# Patient Record
Sex: Female | Born: 1965 | Race: White | Hispanic: No | Marital: Single | Smoking: Current every day smoker
Health system: Southern US, Community
[De-identification: ages and names within clinical notes are randomized; demographics above are authoritative.]

## PROBLEM LIST (undated history)

## (undated) ENCOUNTER — Emergency Department (HOSPITAL_COMMUNITY): Admission: EM | Payer: Medicare Other | Source: Home / Self Care

## (undated) VITALS — BP 105/75 | HR 71 | Temp 97.4°F | Resp 18 | Ht 66.0 in | Wt 154.0 lb

## (undated) DIAGNOSIS — F209 Schizophrenia, unspecified: Secondary | ICD-10-CM

## (undated) DIAGNOSIS — R569 Unspecified convulsions: Secondary | ICD-10-CM

## (undated) DIAGNOSIS — F319 Bipolar disorder, unspecified: Secondary | ICD-10-CM

## (undated) DIAGNOSIS — F419 Anxiety disorder, unspecified: Secondary | ICD-10-CM

## (undated) DIAGNOSIS — M797 Fibromyalgia: Secondary | ICD-10-CM

## (undated) DIAGNOSIS — K219 Gastro-esophageal reflux disease without esophagitis: Secondary | ICD-10-CM

## (undated) DIAGNOSIS — G43909 Migraine, unspecified, not intractable, without status migrainosus: Secondary | ICD-10-CM

## (undated) DIAGNOSIS — I10 Essential (primary) hypertension: Secondary | ICD-10-CM

---

## 2002-08-12 ENCOUNTER — Emergency Department (HOSPITAL_COMMUNITY): Admission: EM | Admit: 2002-08-12 | Discharge: 2002-08-12 | Payer: Self-pay | Admitting: Emergency Medicine

## 2002-08-29 ENCOUNTER — Emergency Department (HOSPITAL_COMMUNITY): Admission: EM | Admit: 2002-08-29 | Discharge: 2002-08-29 | Payer: Self-pay | Admitting: Emergency Medicine

## 2002-09-15 ENCOUNTER — Emergency Department (HOSPITAL_COMMUNITY): Admission: EM | Admit: 2002-09-15 | Discharge: 2002-09-16 | Payer: Self-pay | Admitting: Emergency Medicine

## 2002-10-01 ENCOUNTER — Emergency Department (HOSPITAL_COMMUNITY): Admission: EM | Admit: 2002-10-01 | Discharge: 2002-10-02 | Payer: Self-pay | Admitting: *Deleted

## 2002-10-18 ENCOUNTER — Emergency Department (HOSPITAL_COMMUNITY): Admission: EM | Admit: 2002-10-18 | Discharge: 2002-10-18 | Payer: Self-pay | Admitting: Emergency Medicine

## 2002-10-28 ENCOUNTER — Emergency Department (HOSPITAL_COMMUNITY): Admission: EM | Admit: 2002-10-28 | Discharge: 2002-10-28 | Payer: Self-pay | Admitting: Emergency Medicine

## 2002-11-04 ENCOUNTER — Emergency Department (HOSPITAL_COMMUNITY): Admission: EM | Admit: 2002-11-04 | Discharge: 2002-11-04 | Payer: Self-pay | Admitting: Emergency Medicine

## 2002-11-08 ENCOUNTER — Encounter: Admission: RE | Admit: 2002-11-08 | Discharge: 2002-11-08 | Payer: Self-pay | Admitting: Internal Medicine

## 2002-11-08 ENCOUNTER — Encounter: Payer: Self-pay | Admitting: Internal Medicine

## 2002-11-09 ENCOUNTER — Emergency Department (HOSPITAL_COMMUNITY): Admission: EM | Admit: 2002-11-09 | Discharge: 2002-11-09 | Payer: Self-pay | Admitting: Emergency Medicine

## 2002-11-23 ENCOUNTER — Emergency Department (HOSPITAL_COMMUNITY): Admission: EM | Admit: 2002-11-23 | Discharge: 2002-11-24 | Payer: Self-pay | Admitting: Emergency Medicine

## 2002-11-26 ENCOUNTER — Encounter: Admission: RE | Admit: 2002-11-26 | Discharge: 2002-12-18 | Payer: Self-pay | Admitting: Orthopedic Surgery

## 2002-12-14 ENCOUNTER — Emergency Department (HOSPITAL_COMMUNITY): Admission: AD | Admit: 2002-12-14 | Discharge: 2002-12-14 | Payer: Self-pay | Admitting: Family Medicine

## 2002-12-24 ENCOUNTER — Emergency Department (HOSPITAL_COMMUNITY): Admission: AD | Admit: 2002-12-24 | Discharge: 2002-12-24 | Payer: Self-pay | Admitting: Family Medicine

## 2002-12-27 ENCOUNTER — Emergency Department (HOSPITAL_COMMUNITY): Admission: AD | Admit: 2002-12-27 | Discharge: 2002-12-27 | Payer: Self-pay | Admitting: Family Medicine

## 2003-01-01 ENCOUNTER — Encounter
Admission: RE | Admit: 2003-01-01 | Discharge: 2003-01-30 | Payer: Self-pay | Admitting: Physical Medicine and Rehabilitation

## 2003-01-07 ENCOUNTER — Encounter
Admission: RE | Admit: 2003-01-07 | Discharge: 2003-02-05 | Payer: Self-pay | Admitting: Physical Medicine and Rehabilitation

## 2003-01-09 ENCOUNTER — Emergency Department (HOSPITAL_COMMUNITY): Admission: EM | Admit: 2003-01-09 | Discharge: 2003-01-09 | Payer: Self-pay | Admitting: Emergency Medicine

## 2003-01-11 ENCOUNTER — Emergency Department (HOSPITAL_COMMUNITY): Admission: AD | Admit: 2003-01-11 | Discharge: 2003-01-11 | Payer: Self-pay | Admitting: Family Medicine

## 2003-01-14 ENCOUNTER — Emergency Department (HOSPITAL_COMMUNITY): Admission: EM | Admit: 2003-01-14 | Discharge: 2003-01-14 | Payer: Self-pay | Admitting: Emergency Medicine

## 2003-01-14 ENCOUNTER — Emergency Department (HOSPITAL_COMMUNITY): Admission: AD | Admit: 2003-01-14 | Discharge: 2003-01-14 | Payer: Self-pay | Admitting: Family Medicine

## 2003-01-29 ENCOUNTER — Emergency Department (HOSPITAL_COMMUNITY): Admission: EM | Admit: 2003-01-29 | Discharge: 2003-01-29 | Payer: Self-pay | Admitting: Emergency Medicine

## 2003-02-15 ENCOUNTER — Ambulatory Visit (HOSPITAL_COMMUNITY): Admission: RE | Admit: 2003-02-15 | Discharge: 2003-02-15 | Payer: Self-pay | Admitting: Neurology

## 2003-02-17 ENCOUNTER — Emergency Department (HOSPITAL_COMMUNITY): Admission: EM | Admit: 2003-02-17 | Discharge: 2003-02-17 | Payer: Self-pay | Admitting: Emergency Medicine

## 2003-03-01 ENCOUNTER — Encounter
Admission: RE | Admit: 2003-03-01 | Discharge: 2003-05-30 | Payer: Self-pay | Admitting: Physical Medicine and Rehabilitation

## 2003-03-01 ENCOUNTER — Emergency Department (HOSPITAL_COMMUNITY): Admission: EM | Admit: 2003-03-01 | Discharge: 2003-03-01 | Payer: Self-pay | Admitting: Emergency Medicine

## 2003-03-24 ENCOUNTER — Emergency Department (HOSPITAL_COMMUNITY): Admission: EM | Admit: 2003-03-24 | Discharge: 2003-03-24 | Payer: Self-pay | Admitting: Emergency Medicine

## 2003-04-05 ENCOUNTER — Emergency Department (HOSPITAL_COMMUNITY): Admission: EM | Admit: 2003-04-05 | Discharge: 2003-04-05 | Payer: Self-pay | Admitting: Emergency Medicine

## 2003-04-26 ENCOUNTER — Emergency Department (HOSPITAL_COMMUNITY): Admission: EM | Admit: 2003-04-26 | Discharge: 2003-04-26 | Payer: Self-pay | Admitting: Emergency Medicine

## 2003-05-04 ENCOUNTER — Emergency Department (HOSPITAL_COMMUNITY): Admission: EM | Admit: 2003-05-04 | Discharge: 2003-05-04 | Payer: Self-pay | Admitting: Emergency Medicine

## 2003-05-09 ENCOUNTER — Emergency Department (HOSPITAL_COMMUNITY): Admission: EM | Admit: 2003-05-09 | Discharge: 2003-05-09 | Payer: Self-pay | Admitting: *Deleted

## 2003-05-23 ENCOUNTER — Emergency Department (HOSPITAL_COMMUNITY): Admission: EM | Admit: 2003-05-23 | Discharge: 2003-05-23 | Payer: Self-pay | Admitting: Emergency Medicine

## 2003-05-24 ENCOUNTER — Emergency Department (HOSPITAL_COMMUNITY): Admission: AD | Admit: 2003-05-24 | Discharge: 2003-05-24 | Payer: Self-pay | Admitting: Family Medicine

## 2003-06-02 ENCOUNTER — Emergency Department (HOSPITAL_COMMUNITY): Admission: EM | Admit: 2003-06-02 | Discharge: 2003-06-02 | Payer: Self-pay | Admitting: Emergency Medicine

## 2003-06-06 ENCOUNTER — Emergency Department (HOSPITAL_COMMUNITY): Admission: EM | Admit: 2003-06-06 | Discharge: 2003-06-07 | Payer: Self-pay | Admitting: Emergency Medicine

## 2003-06-18 ENCOUNTER — Emergency Department (HOSPITAL_COMMUNITY): Admission: EM | Admit: 2003-06-18 | Discharge: 2003-06-18 | Payer: Self-pay | Admitting: Emergency Medicine

## 2003-06-19 ENCOUNTER — Emergency Department (HOSPITAL_COMMUNITY): Admission: EM | Admit: 2003-06-19 | Discharge: 2003-06-19 | Payer: Self-pay | Admitting: Family Medicine

## 2003-06-22 ENCOUNTER — Emergency Department (HOSPITAL_COMMUNITY): Admission: EM | Admit: 2003-06-22 | Discharge: 2003-06-22 | Payer: Self-pay | Admitting: Emergency Medicine

## 2003-06-26 ENCOUNTER — Emergency Department (HOSPITAL_COMMUNITY): Admission: EM | Admit: 2003-06-26 | Discharge: 2003-06-26 | Payer: Self-pay | Admitting: Emergency Medicine

## 2003-07-01 ENCOUNTER — Emergency Department (HOSPITAL_COMMUNITY): Admission: EM | Admit: 2003-07-01 | Discharge: 2003-07-01 | Payer: Self-pay | Admitting: Emergency Medicine

## 2003-07-01 ENCOUNTER — Emergency Department (HOSPITAL_COMMUNITY): Admission: EM | Admit: 2003-07-01 | Discharge: 2003-07-01 | Payer: Self-pay | Admitting: Family Medicine

## 2003-07-02 ENCOUNTER — Emergency Department (HOSPITAL_COMMUNITY): Admission: EM | Admit: 2003-07-02 | Discharge: 2003-07-02 | Payer: Self-pay | Admitting: Emergency Medicine

## 2003-07-05 ENCOUNTER — Emergency Department (HOSPITAL_COMMUNITY): Admission: EM | Admit: 2003-07-05 | Discharge: 2003-07-05 | Payer: Self-pay | Admitting: Emergency Medicine

## 2003-07-07 ENCOUNTER — Emergency Department (HOSPITAL_COMMUNITY): Admission: EM | Admit: 2003-07-07 | Discharge: 2003-07-07 | Payer: Self-pay | Admitting: Emergency Medicine

## 2003-07-16 ENCOUNTER — Emergency Department (HOSPITAL_COMMUNITY): Admission: EM | Admit: 2003-07-16 | Discharge: 2003-07-16 | Payer: Self-pay | Admitting: Emergency Medicine

## 2003-12-26 ENCOUNTER — Inpatient Hospital Stay (HOSPITAL_COMMUNITY): Admission: EM | Admit: 2003-12-26 | Discharge: 2004-01-02 | Payer: Self-pay | Admitting: Emergency Medicine

## 2003-12-26 ENCOUNTER — Ambulatory Visit: Payer: Self-pay | Admitting: Internal Medicine

## 2003-12-26 ENCOUNTER — Ambulatory Visit: Payer: Self-pay | Admitting: Cardiology

## 2003-12-27 ENCOUNTER — Encounter: Payer: Self-pay | Admitting: Cardiology

## 2003-12-27 ENCOUNTER — Encounter (INDEPENDENT_AMBULATORY_CARE_PROVIDER_SITE_OTHER): Payer: Self-pay | Admitting: Specialist

## 2004-09-02 IMAGING — CR DG PELVIS 1-2V
1 series · 1 of 1 positions shown · non-contrast
Comparison: 06/07/03.

CLINICAL DATA: 38-year-old who fell on back.  Mid thoracic pain.
 PELVIS, ONE VIEW

[view not recorded]
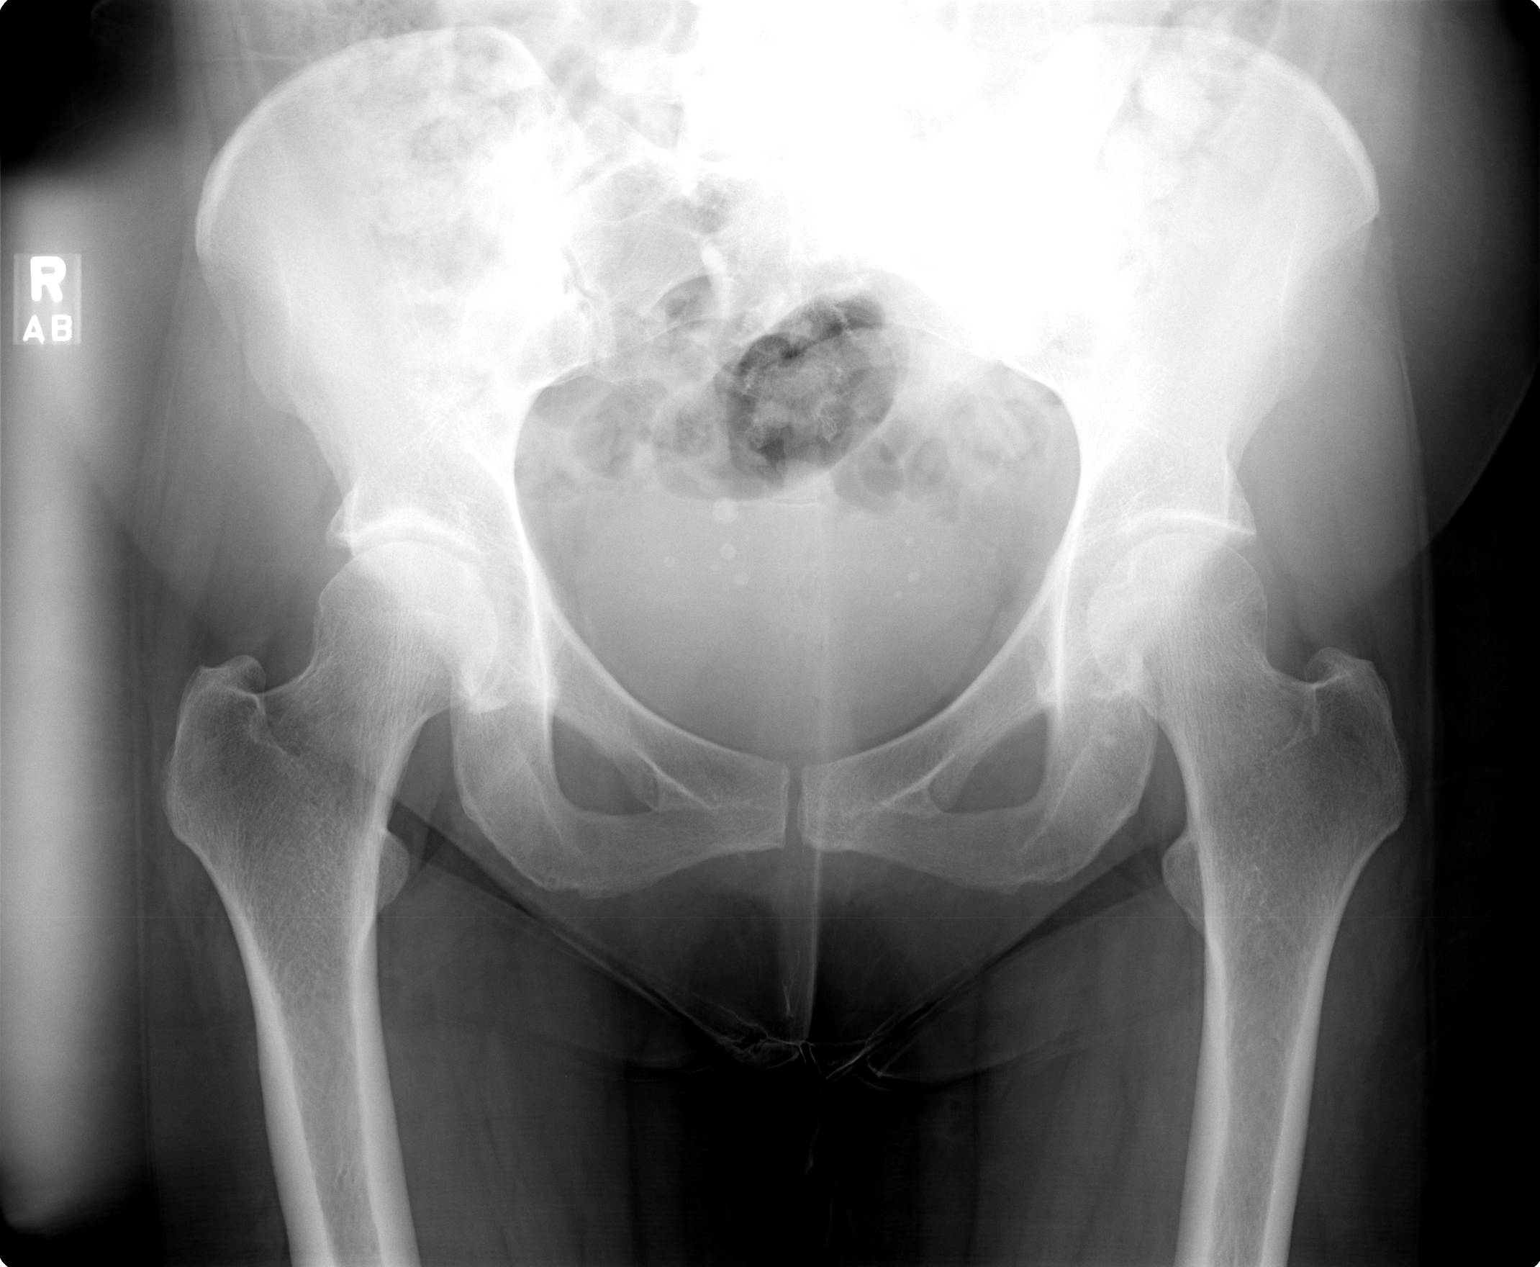

[1 of 1 positions shown; findings below may reference images not displayed]

There is no evidence of fracture or diastasis. No other significant bone or soft tissue abnormalities are identified.
 IMPRESSION
 Normal study.

## 2006-02-18 ENCOUNTER — Emergency Department (HOSPITAL_COMMUNITY): Admission: EM | Admit: 2006-02-18 | Discharge: 2006-02-18 | Payer: Self-pay | Admitting: Emergency Medicine

## 2006-04-17 ENCOUNTER — Emergency Department (HOSPITAL_COMMUNITY): Admission: EM | Admit: 2006-04-17 | Discharge: 2006-04-17 | Payer: Self-pay | Admitting: Emergency Medicine

## 2007-12-11 ENCOUNTER — Emergency Department (HOSPITAL_COMMUNITY): Admission: EM | Admit: 2007-12-11 | Discharge: 2007-12-11 | Payer: Self-pay | Admitting: Family Medicine

## 2008-04-26 ENCOUNTER — Emergency Department (HOSPITAL_COMMUNITY): Admission: EM | Admit: 2008-04-26 | Discharge: 2008-04-26 | Payer: Self-pay | Admitting: Emergency Medicine

## 2008-05-01 ENCOUNTER — Emergency Department (HOSPITAL_COMMUNITY): Admission: EM | Admit: 2008-05-01 | Discharge: 2008-05-01 | Payer: Self-pay | Admitting: Emergency Medicine

## 2008-05-31 ENCOUNTER — Emergency Department (HOSPITAL_COMMUNITY): Admission: EM | Admit: 2008-05-31 | Discharge: 2008-05-31 | Payer: Self-pay | Admitting: Emergency Medicine

## 2008-06-10 ENCOUNTER — Emergency Department (HOSPITAL_COMMUNITY): Admission: EM | Admit: 2008-06-10 | Discharge: 2008-06-10 | Payer: Self-pay | Admitting: Emergency Medicine

## 2008-07-04 ENCOUNTER — Emergency Department (HOSPITAL_COMMUNITY): Admission: EM | Admit: 2008-07-04 | Discharge: 2008-07-04 | Payer: Self-pay | Admitting: Emergency Medicine

## 2008-07-05 ENCOUNTER — Emergency Department (HOSPITAL_COMMUNITY): Admission: EM | Admit: 2008-07-05 | Discharge: 2008-07-06 | Payer: Self-pay | Admitting: Emergency Medicine

## 2008-07-06 ENCOUNTER — Emergency Department (HOSPITAL_COMMUNITY): Admission: EM | Admit: 2008-07-06 | Discharge: 2008-07-06 | Payer: Self-pay | Admitting: Emergency Medicine

## 2008-07-11 ENCOUNTER — Emergency Department (HOSPITAL_COMMUNITY): Admission: EM | Admit: 2008-07-11 | Discharge: 2008-07-11 | Payer: Self-pay | Admitting: Emergency Medicine

## 2008-07-21 ENCOUNTER — Emergency Department (HOSPITAL_COMMUNITY): Admission: EM | Admit: 2008-07-21 | Discharge: 2008-07-21 | Payer: Self-pay | Admitting: Emergency Medicine

## 2008-08-07 ENCOUNTER — Emergency Department (HOSPITAL_COMMUNITY): Admission: EM | Admit: 2008-08-07 | Discharge: 2008-08-07 | Payer: Self-pay | Admitting: Emergency Medicine

## 2008-08-13 ENCOUNTER — Emergency Department (HOSPITAL_COMMUNITY): Admission: EM | Admit: 2008-08-13 | Discharge: 2008-08-13 | Payer: Self-pay | Admitting: Emergency Medicine

## 2008-08-14 ENCOUNTER — Emergency Department (HOSPITAL_COMMUNITY): Admission: EM | Admit: 2008-08-14 | Discharge: 2008-08-14 | Payer: Self-pay | Admitting: Emergency Medicine

## 2008-08-14 ENCOUNTER — Emergency Department (HOSPITAL_COMMUNITY): Admission: EM | Admit: 2008-08-14 | Discharge: 2008-08-14 | Payer: Self-pay | Admitting: Family Medicine

## 2008-08-16 ENCOUNTER — Inpatient Hospital Stay (HOSPITAL_COMMUNITY): Admission: AD | Admit: 2008-08-16 | Discharge: 2008-08-28 | Payer: Self-pay | Admitting: Psychiatry

## 2008-08-16 ENCOUNTER — Other Ambulatory Visit: Payer: Self-pay | Admitting: Emergency Medicine

## 2008-08-16 ENCOUNTER — Ambulatory Visit: Payer: Self-pay | Admitting: Psychiatry

## 2008-09-07 ENCOUNTER — Emergency Department (HOSPITAL_COMMUNITY): Admission: EM | Admit: 2008-09-07 | Discharge: 2008-09-07 | Payer: Self-pay | Admitting: Emergency Medicine

## 2008-09-21 ENCOUNTER — Emergency Department (HOSPITAL_BASED_OUTPATIENT_CLINIC_OR_DEPARTMENT_OTHER): Admission: EM | Admit: 2008-09-21 | Discharge: 2008-09-21 | Payer: Self-pay | Admitting: Emergency Medicine

## 2008-10-07 ENCOUNTER — Emergency Department (HOSPITAL_COMMUNITY): Admission: EM | Admit: 2008-10-07 | Discharge: 2008-10-07 | Payer: Self-pay | Admitting: Emergency Medicine

## 2008-11-23 ENCOUNTER — Inpatient Hospital Stay (HOSPITAL_COMMUNITY): Admission: AD | Admit: 2008-11-23 | Discharge: 2008-11-23 | Payer: Self-pay | Admitting: Obstetrics and Gynecology

## 2008-11-23 ENCOUNTER — Ambulatory Visit: Payer: Self-pay | Admitting: Obstetrics and Gynecology

## 2009-01-21 ENCOUNTER — Emergency Department (HOSPITAL_COMMUNITY): Admission: EM | Admit: 2009-01-21 | Discharge: 2009-01-21 | Payer: Self-pay | Admitting: Emergency Medicine

## 2009-02-20 ENCOUNTER — Observation Stay (HOSPITAL_COMMUNITY): Admission: EM | Admit: 2009-02-20 | Discharge: 2009-02-20 | Payer: Self-pay | Admitting: Emergency Medicine

## 2009-02-21 ENCOUNTER — Emergency Department (HOSPITAL_COMMUNITY): Admission: EM | Admit: 2009-02-21 | Discharge: 2009-02-21 | Payer: Self-pay | Admitting: Emergency Medicine

## 2009-03-19 ENCOUNTER — Emergency Department (HOSPITAL_COMMUNITY): Admission: EM | Admit: 2009-03-19 | Discharge: 2009-03-19 | Payer: Self-pay | Admitting: Family Medicine

## 2010-03-07 ENCOUNTER — Encounter: Payer: Self-pay | Admitting: Physical Medicine and Rehabilitation

## 2010-03-07 ENCOUNTER — Encounter: Payer: Self-pay | Admitting: Neurology

## 2010-05-03 ENCOUNTER — Emergency Department (HOSPITAL_COMMUNITY)
Admission: EM | Admit: 2010-05-03 | Discharge: 2010-05-03 | Disposition: A | Discharge status: Home / Self Care | Payer: Medicare Other | Attending: Emergency Medicine | Admitting: Emergency Medicine

## 2010-05-03 DIAGNOSIS — K089 Disorder of teeth and supporting structures, unspecified: Secondary | ICD-10-CM | POA: Insufficient documentation

## 2010-05-03 DIAGNOSIS — I10 Essential (primary) hypertension: Secondary | ICD-10-CM | POA: Insufficient documentation

## 2010-05-03 DIAGNOSIS — R110 Nausea: Secondary | ICD-10-CM | POA: Insufficient documentation

## 2010-05-03 DIAGNOSIS — IMO0001 Reserved for inherently not codable concepts without codable children: Secondary | ICD-10-CM | POA: Insufficient documentation

## 2010-05-03 DIAGNOSIS — J449 Chronic obstructive pulmonary disease, unspecified: Secondary | ICD-10-CM | POA: Insufficient documentation

## 2010-05-03 DIAGNOSIS — K047 Periapical abscess without sinus: Secondary | ICD-10-CM | POA: Insufficient documentation

## 2010-05-03 DIAGNOSIS — J4489 Other specified chronic obstructive pulmonary disease: Secondary | ICD-10-CM | POA: Insufficient documentation

## 2010-05-03 DIAGNOSIS — F319 Bipolar disorder, unspecified: Secondary | ICD-10-CM | POA: Insufficient documentation

## 2010-05-03 LAB — COMPREHENSIVE METABOLIC PANEL
ALT: 18 U/L (ref 0–35)
AST: 22 U/L (ref 0–37)
Albumin: 3.7 g/dL (ref 3.5–5.2)
Alkaline Phosphatase: 73 U/L (ref 39–117)
BUN: 5 mg/dL — ABNORMAL LOW (ref 6–23)
CO2: 32 mEq/L (ref 19–32)
Calcium: 9.1 mg/dL (ref 8.4–10.5)
Chloride: 103 mEq/L (ref 96–112)
Creatinine, Ser: 0.79 mg/dL (ref 0.4–1.2)
GFR calc Af Amer: >60 mL/min (ref >60)
GFR calc non Af Amer: >60 mL/min (ref >60)
Glucose, Bld: 97 mg/dL (ref 70–99)
Potassium: 3.4 mEq/L — ABNORMAL LOW (ref 3.5–5.1)
Sodium: 142 mEq/L (ref 135–145)
Total Bilirubin: 0.3 mg/dL (ref 0.3–1.2)
Total Protein: 6.7 g/dL (ref 6.0–8.3)

## 2010-05-03 LAB — URINALYSIS, ROUTINE W REFLEX MICROSCOPIC
Bilirubin Urine: NEGATIVE
Glucose, UA: NEGATIVE mg/dL
Hgb urine dipstick: NEGATIVE
Ketones, ur: NEGATIVE mg/dL
Nitrite: NEGATIVE
Protein, ur: NEGATIVE mg/dL
Specific Gravity, Urine: 1.015 (ref 1.005–1.030)
Urobilinogen, UA: 0.2 mg/dL (ref 0.0–1.0)
pH: 8 (ref 5.0–8.0)

## 2010-05-03 LAB — PREGNANCY, URINE: Preg Test, Ur: NEGATIVE

## 2010-05-03 LAB — LIPASE, BLOOD: Lipase: 16 U/L (ref 11–59)

## 2010-05-23 LAB — CBC
HCT: 41.2 % (ref 36.0–46.0)
Hemoglobin: 14 g/dL (ref 12.0–15.0)
MCHC: 34.1 g/dL (ref 30.0–36.0)
MCV: 99.8 fL (ref 78.0–100.0)
Platelets: 212 10*3/uL (ref 150–400)
RBC: 4.12 MIL/uL (ref 3.87–5.11)
RDW: 13.6 % (ref 11.5–15.5)
WBC: 7.9 10*3/uL (ref 4.0–10.5)

## 2010-05-23 LAB — URINALYSIS, ROUTINE W REFLEX MICROSCOPIC
Bilirubin Urine: NEGATIVE
Glucose, UA: NEGATIVE mg/dL
Hgb urine dipstick: NEGATIVE
Ketones, ur: NEGATIVE mg/dL
Nitrite: NEGATIVE
Protein, ur: NEGATIVE mg/dL
Specific Gravity, Urine: 1.019 (ref 1.005–1.030)
Urobilinogen, UA: 0.2 mg/dL (ref 0.0–1.0)
pH: 5.5 (ref 5.0–8.0)

## 2010-05-23 LAB — COMPREHENSIVE METABOLIC PANEL
ALT: 99 U/L — ABNORMAL HIGH (ref 0–35)
AST: 22 U/L (ref 0–37)
Albumin: 4.3 g/dL (ref 3.5–5.2)
Alkaline Phosphatase: 125 U/L — ABNORMAL HIGH (ref 39–117)
BUN: 9 mg/dL (ref 6–23)
CO2: 29 mEq/L (ref 19–32)
Calcium: 9.1 mg/dL (ref 8.4–10.5)
Chloride: 101 mEq/L (ref 96–112)
Creatinine, Ser: 0.74 mg/dL (ref 0.4–1.2)
GFR calc Af Amer: >60 mL/min (ref >60)
GFR calc non Af Amer: >60 mL/min (ref >60)
Glucose, Bld: 143 mg/dL — ABNORMAL HIGH (ref 70–99)
Potassium: 3.2 mEq/L — ABNORMAL LOW (ref 3.5–5.1)
Sodium: 136 mEq/L (ref 135–145)
Total Bilirubin: 0.6 mg/dL (ref 0.3–1.2)
Total Protein: 7.8 g/dL (ref 6.0–8.3)

## 2010-05-23 LAB — DIFFERENTIAL
Basophils Absolute: 0 10*3/uL (ref 0.0–0.1)
Basophils Relative: 1 % (ref 0–1)
Eosinophils Absolute: 0.1 10*3/uL (ref 0.0–0.7)
Eosinophils Relative: 1 % (ref 0–5)
Lymphocytes Relative: 37 % (ref 12–46)
Lymphs Abs: 2.9 10*3/uL (ref 0.7–4.0)
Monocytes Absolute: 0.5 10*3/uL (ref 0.1–1.0)
Monocytes Relative: 6 % (ref 3–12)
Neutro Abs: 4.4 10*3/uL (ref 1.7–7.7)
Neutrophils Relative %: 56 % (ref 43–77)

## 2010-05-23 LAB — LIPASE, BLOOD: Lipase: 33 U/L (ref 11–59)

## 2010-05-23 LAB — RAPID URINE DRUG SCREEN, HOSP PERFORMED
Amphetamines: NOT DETECTED
Barbiturates: NOT DETECTED
Benzodiazepines: NOT DETECTED
Cocaine: NOT DETECTED
Opiates: NOT DETECTED
Tetrahydrocannabinol: NOT DETECTED

## 2010-05-23 LAB — ETHANOL: Alcohol, Ethyl (B): <5 mg/dL (ref 0–10)

## 2010-05-23 LAB — PREGNANCY, URINE: Preg Test, Ur: NEGATIVE

## 2010-05-23 LAB — ACETAMINOPHEN LEVEL: Acetaminophen (Tylenol), Serum: <10 ug/mL — ABNORMAL LOW (ref 10–30)

## 2010-05-24 LAB — BASIC METABOLIC PANEL
BUN: 11 mg/dL (ref 6–23)
CO2: 29 mEq/L (ref 19–32)
Calcium: 8.8 mg/dL (ref 8.4–10.5)
Chloride: 105 mEq/L (ref 96–112)
Creatinine, Ser: 0.8 mg/dL (ref 0.4–1.2)
GFR calc Af Amer: >60 mL/min (ref >60)

## 2010-05-24 LAB — POCT PREGNANCY, URINE: Preg Test, Ur: NEGATIVE

## 2010-05-24 LAB — DIFFERENTIAL
Basophils Absolute: 0 10*3/uL (ref 0.0–0.1)
Basophils Relative: 0 % (ref 0–1)
Eosinophils Absolute: 0.1 10*3/uL (ref 0.0–0.7)
Monocytes Relative: 7 % (ref 3–12)
Neutro Abs: 4.6 10*3/uL (ref 1.7–7.7)
Neutrophils Relative %: 66 % (ref 43–77)

## 2010-05-24 LAB — RAPID URINE DRUG SCREEN, HOSP PERFORMED
Amphetamines: NOT DETECTED
Barbiturates: NOT DETECTED
Opiates: POSITIVE — AB

## 2010-05-24 LAB — CBC
MCHC: 34.4 g/dL (ref 30.0–36.0)
MCV: 99.3 fL (ref 78.0–100.0)
Platelets: 208 10*3/uL (ref 150–400)
RBC: 4.3 MIL/uL (ref 3.87–5.11)
RDW: 14.5 % (ref 11.5–15.5)

## 2010-05-26 LAB — URINALYSIS, ROUTINE W REFLEX MICROSCOPIC
Bilirubin Urine: NEGATIVE
Glucose, UA: NEGATIVE mg/dL
Hgb urine dipstick: NEGATIVE
Specific Gravity, Urine: 1.007 (ref 1.005–1.030)
Urobilinogen, UA: 0.2 mg/dL (ref 0.0–1.0)
pH: 5.5 (ref 5.0–8.0)

## 2010-05-26 LAB — URINE CULTURE: Colony Count: 7000

## 2010-06-30 NOTE — Discharge Summary (Signed)
[name], [name] NO.:  [id number]   MEDICAL RECORD NO.:  [id number]          PATIENT TYPE:  IPS   LOCATION:  0305                          FACILITY:  [facility]   PHYSICIAN:  Jasmine Pang, M.D. DATE OF BIRTH:  [birth year]   DATE OF ADMISSION:  08/16/2008  DATE OF DISCHARGE:  08/28/2008                               DISCHARGE SUMMARY   IDENTIFYING INFORMATION:  This is a 45 year old divorced white female  who was admitted on August 16, 2008.   HISTORY OF PRESENT ILLNESS:  The patient presented to the ED complaining  of headache for 4 days.  She also was complaining of being homicidal  towards another resident at her assisted living.  She states she is  moving to live with her mother and on January 23, 2009, she was  hospitalized in [place].  She has been going to mental health since  5 in [place], [place] before she just recently moved here.  For further admission information, see psychiatric admission assessment.   DIAGNOSES:  Upon admission on:  Axis I:  Depression was given.  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  Axis III:  Chronic obstructive pulmonary disease, fibromyalgia,  degenerative disk disease, and hypertension.   PHYSICAL FINDINGS:  There were no acute physical or medical problems  noted.   HOSPITAL COURSE:  The patient was initially restarted on her home  medication of Paxil 60 mg daily, Zyprexa Zydis 5 mg p.o. q.4 h. for  agitation was started, and Ativan 2 mg q.4 h. p.r.n. agitation was  started.  She was also started on Fioricet for headache, Lamictal 200 mg  p.o. daily, and Percocet 5/325 mg 1-2 p.o. q.4-6 h. p.r.n. pain.  She  was started on Seroquel 100 mg p.o. b.i.d. and Seroquel 200 mg p.o.  q.h.s.  In individual sessions, the patient was initially disheveled  with poor eye contact.  There was psychomotor retardation.  Mood was  depressed and anxious.  She admitted to suicidal and homicidal  ideation  (against the another peer at her assisted living who she felt was  bothering her).  There was no evidence of psychosis.  Thoughts were  somewhat confused, but overall within normal limits.  She was having  trouble sleeping, so she was started on Ambien 5 mg p.o. q.h.s.  She  also was restarted on her home medications of Inderal 80 mg LA daily and  Flexeril 10 mg t.i.d. p.r.n. muscle spasm and she was started on  albuterol inhaler 2 puffs q.4 h. for shortness of breath.  Due to  continued problems of sleeping, the patient's Ambien was increased to 10  mg p.o. q.h.s.  By August 20, 2008, the patient was less depressed and less  anxious.  She was complaining of some blurred vision.  She discussed her  alienation from her daughter.  She wanted a family session with her.  She stated she felt her Paxil was not working.  On August 21, 2008, she was  very depressed and anxious with passive suicidal ideation.  She stated  she was  having auditory hallucinations.  She was seeing and hearing her  daughter and grandchild.  She seemed more confused I am under a lot of  stress.  She continued to be confused, though on August 22, 2008, was able  to converse better.  She did discuss the support of her mother whom she  is going home to live with and on 09/02/2008, she became more depressed  again I am just depressed with life.  She was still having suicidal  ideation.  She discussed her conflict with her sister and her daughter.  Due to her pain for degenerative disk disease, Flexeril was discontinued  and she was started on Skelaxin 800 mg p.o. t.i.d. p.o.  On August 24, 2008, she began to be titrated off Paxil.  She was started on Celexa 10  mg p.o. daily for 2 days then increased to 20 mg p.o. daily.  She stated  she felt like the Paxil has not been helping.  On August 26, 2008, she was  less confused, more logical and goal directed.  She continued to  transition from Paxil to Celexa without problems.  She  talked about  being upset because her daughter was pregnant again and already has 2  children.  She does not want to return to assisted living and will be  going home to live with her mother.  On August 27, 2008, the patient was  less depressed and less anxious.  She was having no side effects to her  medications and her sleep was good.  On August 28, 2008, mental status  improved markedly from admission status.  The patient was friendly and  cooperative with good eye contact.  Speech was normal rate and flow.  Psychomotor activity was within normal limits.  Her mood was less  depressed, less anxious.  Affect was consistent with mood.  There was no  suicidal or homicidal ideation.  No auditory or visual hallucinations.  No paranoia or delusions.  Thoughts were logical and goal directed.  The  patient wanted to go home today and was felt to be safe for discharge.  She was denying any side effects to her medication.   DISCHARGE DIAGNOSES:  Axis I:  Mood disorder, not otherwise specified.  Axis II.  Personality disorder with cluster B features.  Axis III:  Chronic obstructive pulmonary disease, fibromyalgia,  degenerative disk disease, and hypertension.  Axis IV:  Severe (problems with primary support, occupational problem,  housing problem, economic problem, burden of psychiatric illness, burden  of medical problems).  Axis V:  Global assessment of functioning was 50 at time of discharge.  Global assessment of functioning was 35 upon admission.  GAF highest  past year was 55-60.   DISCHARGE PLANS:  There was no specific activity level or dietary  restrictions.   POST HOSPITAL CARE PLANS:  The patient will go to [facility] to see  [place] on September 03, 2008 at 11 o'clock.   DISCHARGE MEDICATIONS:  1. Lamictal 200 mg daily.  2. Seroquel 200 mg at bedtime.  3. Inderal LA 80 mg daily.  4. Paxil 10 mg daily for 1 week then discontinue.  5. Celexa 20 mg daily.  6. Zyprexa Zydis  every 4 hours as needed for agitation.  7. Lorazepam 1 mg every 6 hours p.r.n. anxiety.  8. Albuterol inhaler 2 puffs every 4 hours as needed for shortness of      breath.  9. Ambien 10 mg p.o. q.h.s. p.r.n. insomnia  and Percocet as directed      by her outpatient.      Jasmine Pang, M.D.  Electronically Signed     BHS/MEDQ  D:  08/28/2008  T:  08/29/2008  Job:  [id number]

## 2010-07-03 NOTE — Op Note (Signed)
[name], STALLWORTH NO.:  [id number]   MEDICAL RECORD NO.:  [id number]          PATIENT TYPE:  INP   LOCATION:  4702                         FACILITY:  [facility]   PHYSICIAN:  Darlin Priestly, MD  DATE OF BIRTH:  [birth year]   DATE OF PROCEDURE:  12/31/2003  DATE OF DISCHARGE:                                 OPERATIVE REPORT   PROCEDURE:  Transesophageal echocardiogram.   INDICATIONS:  [name] is a 45 year old female, patient of Dr. Madaline Guthrie, with a history of dizziness and a history of CVA, questionable  history of syncope who is now referred for a transesophageal echocardiogram  to evaluate valves to rule out SBE as well as studies to rule out EFO.   DESCRIPTION OF PROCEDURE:  After given informed written consent, the patient  was brought to the endoscopy suite where she received 200 mg of IV Demerol  and 8 mg of IV Versed and remained awake throughout the entire procedure and  hemodynamically stable.   The left ventricle appeared to be normal size with normal LV systolic  function.  Estimated at approximately 60%.   Obstruction in normal aortic valve.  Obstruction in mitral valve with  trivial mitral regurgitation.  Obstruction in normal tricuspid with trace  tricuspid regurgitation.  No evidence of intracardiac mass or thrombus  noted.  No evidence of PFO by color contrast echocardiogram.  No obvious  source of embolus present.   CONCLUSIONS:  Successful transesophageal echocardiogram with no evidence of  patent foramen ovale or source of embolus.      Robe   RHM/MEDQ  D:  12/31/2003  T:  12/31/2003  Job:  [id number]   cc:   Madaline Guthrie, M.D.  [address]  [place] [address]  Fax: [phone]

## 2010-07-03 NOTE — Assessment & Plan Note (Signed)
CHIEF COMPLAINT:  Neck and back pain and recent onset of headaches.   The patient was last seen January 02, 2003.  At that time she was being  seen for chronic neck and back pain as well as fibromyalgia.  She was  apparently seen approximately five days later for headaches and had a  cranial CT without contrast on January 09, 2003, which showed small lacunar  versus demyelinating process in the left thalamus.  Similar changes were  noted in the deep white matter of the right occipital lobe.  An MRI was  recommended to follow-up on these changes.  The patient reports she has an  appointment with Dr. Mechele Collin and has an MRI scheduled on February 01, 2003,  which is in two days. She has an appointment to see Dr. Leonides Cave, her  psychologist, on Monday, which is in about six more days and she has seen  the physical therapist for evaluation one time.   Nursing reports she has been calling the clinic regularly for pain  medication.  Apparently, she may not have taken medications as prescribed at  the last visit and ran out of medication.  She reports she was unclear about  our policies regarding taking pain medication and the nurses did review once  again our clinic policies with her.  She reports today she understands the  policies now.   She continues to have neck and low back pain.  She reports she has numbness  in her left upper extremity and left lower extremity.  She denies any bowel  or bladder problems.  Denies any weakness or gait problems.  Has some  difficulty with bending and moving her neck.  She gets pain with turning her  neck.   Pain medication, she reports about 30% relief of her pain with medications.  She has gotten various other medications from the emergency room such as  Percocet and Valium recently.   CURRENT MEDICATIONS:  Inderal, clonidine, Valium #15, and Percocet #6.   ALLERGIES:  ANTI-INFLAMMATORY MEDICATIONS, SULFER MEDICATIONS and STEROIDS.   PHYSICAL  EXAMINATION:  She is oriented x3.  She is well-nourished, well-  developed woman somewhat depressed but cooperative during the examination.  She is able to come off of the exam table without difficulty.  Her gait was  essentially normal.  She had a slightly shorter stride length on the right  than on the left.  She had some difficulty heel-toe walking with balance.  Romberg test was negative but she did have a slight pronator drift on the  left.  She has limited forward flexion and extension, lateral flexion and  rotation in her cervical spine by about 30% overall.  In the lumbar spine,  she had limited forward flexion by about 30%. She had 0 lumbar extension.  She had about 5 degrees of lateral flexion both right and left.  Seated on  the exam table, reflexes were 1+ at biceps, triceps and brachial radialis  bilaterally.  She had 2+ reflexes at the knees, 0 at the ankles.  Toes are  downgoing.  No clonus was noted.  Sensory examination, she reported  diminished sensation throughout the entire left upper extremity and  throughout the entire left lower extremity.  Motor strength on the lower  extremities was in the 4+/5 range throughout with some give way weakness  noted possibly secondary to pain in her low back.  EHLs, however, were 4/5  bilaterally.  Upper extremity motor strength was in the 5/5 range.  Her  right shoulder did limit abduction.  She reported some right shoulder pain  with abduction.  Of note today, her face was symmetric and pupils were equal  and reactive.   IMPRESSION:  1. Chronic low back pain with marked L4-5 facet hypertrophy as well as left     L4-5 root encroachment noted per MRI.  2. Chronic cervical pain.  Will obtain cervical MRI to evaluate for     stenosis.  3. Recent problems with headaches and possible demyelinating process versus     lacunar infarct.  The patient is following up with Dr. Mechele Collin on     February 11, 2003.  She is going to get a brain MRI  February 01, 2003.   The patient is to finish following up with the physical therapist as well as  see Dr. Leonides Cave.  Will await results from the neurologist regarding her brain  MRI.   For pain today, she was given Lorcet 10/650 one p.o. q.i.d. #120.   Will follow the patient up in about four weeks and review the MRI scan.  Possibly have her seen by Dr. Wynn Banker for the facet hypertrophy and nerve  root encroachment on the left.      Brantley Stage, M.D.   DMK/MedQ  D:  01/30/2003 13:17:17  T:  01/30/2003 14:26:55  Job #:  [id number]   cc:   Marlowe Kays, M.D.  [address]  [place]  [place] [address]  Fax: [phone]

## 2010-07-03 NOTE — Discharge Summary (Signed)
[name], [name]              ACCOUNT NO.:  [id number]   MEDICAL RECORD NO.:  [id number]          PATIENT TYPE:  INP   LOCATION:  4702                         FACILITY:  [facility]   PHYSICIAN:  [name], M.D.    DATE OF BIRTH:  06/02/1965   DATE OF ADMISSION:  12/26/2003  DATE OF DISCHARGE:  01/02/2004                                 DISCHARGE SUMMARY   DISCHARGE DIAGNOSES:  1.  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  2.  Antiphosphoid antibody positive.  3.  Normocytic anemia.      1.  Iron studies normal.      2.  RBC folate and B12 within normal limits.  4.  Dizziness of unknown etiology.  5.  Urinary tract infection with greater than 100,000 E. coli.   PAST MEDICAL HISTORY:  1.  Asthma.  2.  Chronic neck and low back pain.  3.  Fibromyalgia.  4.  Seizures.  5.  CVA in September of 2005.  6.  Anxiety.  7.  History of oral surgery.   DISCHARGE MEDICATIONS:  1.  Aspirin 325 mg daily.  2.  Lamictal 200 mg b.i.d.  3.  Paxil 30 mg daily.  4.  Soma 350 mg t.i.d. p.r.n.  5.  Valium 2 mg t.i.d. p.r.n.  6.  Vicodin 5/500 mg q.4 h. p.r.n. pain.  7.  Zocor 20 mg daily.  8.  Ambien 10 mg nightly p.r.n.  9.  Levaquin 500 mg daily.  10. Metronidazole 500 mg q.8 h.   CONSULTANT:  [name] of Cardiology was consulted for a  transesophageal echocardiogram.   PROCEDURES:  1.  The patient had a 2-D echocardiogram performed on December 27, 2003      which was with an ejection fraction of 55% to 65%, no wall motion      abnormalities and no evidence of a patent foramen ovale.  2.  Lower extremity Dopplers were performed on December 27, 2003, which were      without any evidence of deep venous thromboses.  3.  MRI/MRA of the head were performed on December 27, 2003 which revealed a      remote thalamic lacunar cerebrovascular accident with no new lesions and      the MRA was normal.  4.  The patient had a transesophageal echocardiogram  performed on December 31, 2003 which was negative for a patent foramen ovale and negative for      any vegetations.   HISTORY OF PRESENT ILLNESS:  In brief, [name] is a 45 year old white  female with debilitating psychiatric illness and history of a CVA in  September 2005, who presented with 1 month of cough productive for yellow  and brown sputum along with 3 days of worsening shortness of breath.  The  patient also describes throbbing left-sided chest pain and abdominal pain,  as well as complaining of significant lightheadedness with multiple falls.  The patient reported chills, but no fevers, no night sweats, did report  approximately a 20-pound weight loss over 3 weeks  as well as palpitations,  nausea and vomiting.  She denied any wheezing, diarrhea.   PHYSICAL EXAM:  VITAL SIGNS:  Temperature 97.5, a pulse of 87, blood  pressure of 128/71, respiratory rate of 18, oxygen saturation 99% on room  air.  GENERAL:  In general, she was a well-appearing female in no acute distress.  HEENT:  Eyes are nonicteric, equal, reactive to light.  Extraocular  movements intact.  ENT:  Tympanic membranes were clear.  Oral mucosa was  without any lesions.  NECK:  Neck was without any lymphadenopathy, thyromegaly or carotid bruits.  LUNGS:  Respirations were clear and resonant to percussion.  CARDIOVASCULAR:  Regular rhythm and rate with no murmurs, rubs, or gallops.  Pulses were 2+ bilaterally in upper and lower extremities.  ABDOMEN:  Abdomen was with positive bowel sounds, soft, nontender.  EXTREMITIES:  Extremities were with no edema.  NEUROLOGIC:  The patient was alert and oriented x4 with a Mini-Mental Status  score of about 24.  Strength was 5/5 bilaterally in the upper extremities  and approximately 4/5 in the lower extremities.  Deep tendon reflexes were  approximately 2+ throughout.  Cerebellar function was normal.  The patient  had normal gait.   LABORATORIES:  Labs were with a  sodium of 139, potassium 3.1, chloride 104,  bicarbonate 31, BUN of 9, glucose of 89.  CBC showed a white blood cell  count of 10.7, hemoglobin 9.5, platelet count of 541,000; MCV was 93.1 with  RDW 14.5.  Cardiac enzymes were initially negative.   Chest x-ray revealed a lingular infiltrate and a cavitary lesion in the  anterior left upper lobe.   HOSPITAL COURSE:  PROBLEM #1 - COUGH/SHORTNESS OF BREATH:  Given the  cavitary lung lesion found on chest x-ray, the patient was admitted to  respiratory isolation bed and started on broad-spectrum antibiotics.  Sputum  for acid-fast bacilli was negative x2; sputum cultures were negative for  fungal infection as well; sputum culture were positive for methicillin-  resistant Staph aureus with subsequent blood cultures being negative.  The  patient was treated with IV vancomycin and then transitioned to p.o.  Levaquin.  She was maintained on metronidazole for coverage of possible  anaerobes, given the cavitary nature of her lung lesion.  HIV was checked  and was found to be nonreactive.   PROBLEM #2 - CHEST PAIN:  The patient was monitored on telemetry with  cardiac enzymes being cycled and found to be negative.  Repeat EKGs were  likewise negative.  The patient did have a 2-D echocardiogram performed  which was without any acute wall motion abnormalities and a normal ejection  fraction.   PROBLEM #3 - HISTORY OF CEREBROVASCULAR ACCIDENT:  Given the patient's young  age with a history of acute CVA, hypercoagulable workup was significant for  only antiphospholipid antibodies.  A 2-D echocardiogram was performed to  evaluate for a paradoxical embolus and a subsequent TEE was performed to  further rule this out; both of these were negative for either PFO or  vegetation.   PROBLEM #4 - DIZZINESS:  Given the patient's history of acute CVA, an MRI/MRA were performed with results as noted above.  The patient's  neurologic exam remained nonfocal.   Given her positive antiphospholipid  antibody, however, she was discharged on aspirin to prevent CVAs.   PROBLEM #5 - NORMOCYTIC ANEMIA:  Unclear what the patient's baseline  hemoglobin is.  Iron studies including ferritin were all within normal  limits, as  were RBC folate and B12.  I believe this is secondary to  menstruation.  The patient refused iron therapy.   PROBLEM #6 - URINARY TRACT INFECTION WITH GREATER THAN 100,000 COLONIES OF  E. COLI:  This was found to be sensitive to Cipro and was treated with 3  days of p.o. ciprofloxacin with resolution of symptoms.   All other problems were stable during this hospitalization.   LABORATORIES AT TIME OF DISCHARGE:  Labs at time of discharge were with a  CBC of 4.9, a hemoglobin of 9.8 and platelet count of 371,000; a sodium was  137, potassium 3.7, chloride 106, bicarbonate 27, BUN of 5, creatinine of  0.9 and glucose of 79.      KK/MEDQ  D:  03/19/2004  T:  03/19/2004  Job:  [id number]

## 2010-07-03 NOTE — Assessment & Plan Note (Signed)
[name] is a 45 year old woman who is our pain and rehabilitative  clinic for the third time for a recheck.  She has a history of L4-5 facet  hypertrophy and L4-5 root encroachment from a far lateral disk protrusion.  She has a history of chronic neck pain.  She is currently seeing a  neurologist, Dr. Porfirio Mylar Dohmeier, for headaches and for a small focal lesion  in her thalamus.  She recently had an MRI of her brain, she is following up  with Dr. Vickey Huger for that.  She comes in the clinic complaining that she  has had a terrible headache for approximately two weeks.  She requested the  lights be dimmed in the room.  She brings in some of her medications today,  we have asked that she bring in bottles of all of her medications.  She  leave her narcotic medication bottles at home but she has brought in almost  everything else.   CURRENT MEDICATIONS:  1. Lamictal 100 mg daily.  She reports that this is for a seizure disorder,     she had a seizure when she was being went off clonazepam and she is not     sure if she really needs to be on Lamictal for the seizure since she only     had the one seizure.  2. Valium q.i.d.  3. Inderal 60 mg daily.  4. Clonidine t.i.d., however reports that she has really only taken it about     eight days in the last month and one-half.  We count her pills and she     has only taken it about eight days in the last month.  5. Soma 350 mg q.i.d.  6. Zantac 150 mg p.r.n.  7. Paroxetine which she takes daily.   She reports her biggest complaint is a two-week history of a headache.  She  also has neck pain and back pain, and bilateral leg pain.  She describes her  pain in all the above areas as a 10 on a scale of zero to 10.  She gets  about 50% relief with pain medications.  On further questioning it was noted  that her blood pressure today was 170/117.  She reports that she really does  not take her blood pressure medicine as prescribed and has not done  so for  over a month.   At her last visit we had her set up to have a cervical MRI.  She has a small  to slightly moderate sized central disk herniation at C5-6 which does not  significantly narrow the neuroforamen. These results were reviewed with her.  She denies any new problems with numbness, tingling, weakness.  She denies  any new bowel or bladder symptoms.  She denies any new problems with walking  or speech.   PHYSICAL EXAMINATION:  VITAL SIGNS:  Her blood pressure is 170/117, pulse  72, respiration 16, pulse oximetry is 98/5 on room air.   DICTATION ENDED HERE.      Brantley Stage, M.D.   DMK/MedQ  D:  03/01/2003 17:21:38  T:  03/02/2003 06:40:21  Job #:  [id number]

## 2010-07-03 NOTE — Assessment & Plan Note (Signed)
PROBLEM LIST:  Problem list includes the following:  1. Chronic low back pain.  2. Left 4-5 facet hypertrophy and left L4-5 root encroachment from far-     lateral disk protrusion.  3. Chronic neck pain.  4. History of headaches.   CURRENT MEDICATIONS:  1. Lamictal 100 mg daily.  2. Valium -- prescription has run out.  3. Plavix -- the patient reports she does not take this any more.  4. Carisoprodol 350 mg 4 times daily.  5. Clonazepam t.i.d.  6. Paroxetine 30 mg nightly.  7. Inderal 60 mg daily.  8. Phenergan 25 mg q.4-6 h.  9. Clonidine 0.1 mg t.i.d.   The patient reports that she has been taking Lamictal for possible seizure  disorder.  She reports that she does not really take her clonidine at all;  we do a pill count; over the last month and a half, she has taken  approximately 8 days' worth of medication.  She does not take the Plavix and  she no longer takes Valium.  She is also taking Lorcet 10/650 mg 4 times a  day.   HISTORY OF PRESENT ILLNESS:  The patient reports that she has had a severe  headache for the last 2 weeks.  She was supposed to follow up with her  neurologist, however, she did not keep her appointment.  She had a recent  brain scan on February 15, 2003 and she has not followed up with Dr. Porfirio Mylar  Dohmeier to review this.  I also obtained an MRI on her; this was reviewed  with her.  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  The patient did  not bring in her narcotic bottle today.  Nurse called the pharmacy.  Apparently, Ms. Chambers has been using several pharmacies to fill her  medications.  She signed a contract with [place] to be the only narcotic [employer]  for her; she has broken this contract and has had Dr. Vickey Huger also  prescribe oxycodone for her as well.   The patient denies any new problems with numbness, tingling, weakness; no  new bowel or bladder symptoms.  Her  pain in her head and neck and low back  are all a 10 on a scale of 10, per her report.   PHYSICAL EXAMINATION:  GENERAL:  On exam today, she appears somewhat tired.  She is alert and cooperative.  NEUROMUSCULAR:  She is able to get out of the exam chair without difficulty.  Her gait in the room is normal.  She is able to walk on heels and toes.  Her  tandem gait is slow but she is able to perform this adequately.  Romberg's  test is negative.  Seated, her reflexes in the upper and lower extremities  are 1+ at the biceps, triceps and brachioradialis bilaterally.  At the  knees, she is 2+ in the right patellar tendon, 1+ at the left patellar  tendon, 0 at the ankles and toes are downgoing.  She reports diminished  sensation throughout the entire left side of her body, upper and lower  extremities, to light touch and pinprick.   Her face moved symmetrically today and her speech is clear.   ASSESSMENT:  1. Chronic low back and neck pain.  2. 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  3. Headache.  Blood pressure is 170/117; the rest of her vital signs are     stable, pulse 72, respirations  16, 98% on room air.  We discussed that     her blood pressure is extremely high.  We rechecked the blood pressure     today; on repeat, it is 167/111.  She is advised to see her family doctor     after she leaves our clinic; we help her make an appointment to see Dr.     Yetta Barre.  Of note, she does not take her clonidine as directed; in fact,     she has only taken about 8 days' worth over the last month and a half and     this may have some bearing on her headache.  4. We also encourage her to return to  Dr. Vickey Huger for followup of her     brain scan and further treatment from her neurologist.   Mr. Jamroz has demonstrated that she uses multiple pharmacies and has used  multiple physicians to obtain opioid medications.  She is not compliant with  her medications and I have concerns about treating her with  narcotics, given  her noncompliance and using multiple physicians as well as pharmacies.  At  this point, we would like to discharge her from our clinic and she is also  given a list of other pain management centers from which she can choose.      Brantley Stage, M.D.   DMK/MedQ  D:  03/01/2003 17:30:58  T:  03/02/2003 06:23:16  Job #:  [id number]

## 2010-08-05 ENCOUNTER — Emergency Department (HOSPITAL_COMMUNITY)
Admission: EM | Admit: 2010-08-05 | Discharge: 2010-08-05 | Disposition: A | Discharge status: Home / Self Care | Payer: Medicare Other | Attending: Emergency Medicine | Admitting: Emergency Medicine

## 2010-08-05 DIAGNOSIS — M545 Low back pain, unspecified: Secondary | ICD-10-CM | POA: Insufficient documentation

## 2010-08-05 DIAGNOSIS — IMO0002 Reserved for concepts with insufficient information to code with codable children: Secondary | ICD-10-CM | POA: Insufficient documentation

## 2010-08-05 DIAGNOSIS — M542 Cervicalgia: Secondary | ICD-10-CM | POA: Insufficient documentation

## 2010-08-05 DIAGNOSIS — Z79899 Other long term (current) drug therapy: Secondary | ICD-10-CM | POA: Insufficient documentation

## 2010-08-05 DIAGNOSIS — J4489 Other specified chronic obstructive pulmonary disease: Secondary | ICD-10-CM | POA: Insufficient documentation

## 2010-08-05 DIAGNOSIS — M79609 Pain in unspecified limb: Secondary | ICD-10-CM | POA: Insufficient documentation

## 2010-08-05 DIAGNOSIS — J449 Chronic obstructive pulmonary disease, unspecified: Secondary | ICD-10-CM | POA: Insufficient documentation

## 2010-08-05 DIAGNOSIS — F319 Bipolar disorder, unspecified: Secondary | ICD-10-CM | POA: Insufficient documentation

## 2010-08-05 DIAGNOSIS — I10 Essential (primary) hypertension: Secondary | ICD-10-CM | POA: Insufficient documentation

## 2010-08-05 DIAGNOSIS — G8929 Other chronic pain: Secondary | ICD-10-CM | POA: Insufficient documentation

## 2010-08-30 ENCOUNTER — Emergency Department (HOSPITAL_COMMUNITY)
Admission: EM | Admit: 2010-08-30 | Discharge: 2010-08-30 | Disposition: A | Discharge status: Home / Self Care | Payer: Medicare Other | Attending: Emergency Medicine | Admitting: Emergency Medicine

## 2010-08-30 DIAGNOSIS — J449 Chronic obstructive pulmonary disease, unspecified: Secondary | ICD-10-CM | POA: Insufficient documentation

## 2010-08-30 DIAGNOSIS — M545 Low back pain, unspecified: Secondary | ICD-10-CM | POA: Insufficient documentation

## 2010-08-30 DIAGNOSIS — R209 Unspecified disturbances of skin sensation: Secondary | ICD-10-CM | POA: Insufficient documentation

## 2010-08-30 DIAGNOSIS — IMO0001 Reserved for inherently not codable concepts without codable children: Secondary | ICD-10-CM | POA: Insufficient documentation

## 2010-08-30 DIAGNOSIS — J4489 Other specified chronic obstructive pulmonary disease: Secondary | ICD-10-CM | POA: Insufficient documentation

## 2010-08-30 DIAGNOSIS — S335XXA Sprain of ligaments of lumbar spine, initial encounter: Secondary | ICD-10-CM | POA: Insufficient documentation

## 2010-08-30 DIAGNOSIS — F319 Bipolar disorder, unspecified: Secondary | ICD-10-CM | POA: Insufficient documentation

## 2010-08-30 DIAGNOSIS — I10 Essential (primary) hypertension: Secondary | ICD-10-CM | POA: Insufficient documentation

## 2010-08-30 DIAGNOSIS — M5430 Sciatica, unspecified side: Secondary | ICD-10-CM | POA: Insufficient documentation

## 2010-09-23 ENCOUNTER — Ambulatory Visit
Admission: RE | Admit: 2010-09-23 | Discharge: 2010-09-23 | Disposition: A | Discharge status: Home / Self Care | Payer: Medicare Other | Source: Ambulatory Visit | Attending: Specialist | Admitting: Specialist

## 2010-09-23 ENCOUNTER — Other Ambulatory Visit: Payer: Self-pay | Admitting: Specialist

## 2010-09-23 DIAGNOSIS — M549 Dorsalgia, unspecified: Secondary | ICD-10-CM

## 2010-10-06 ENCOUNTER — Other Ambulatory Visit: Payer: Self-pay | Admitting: Specialist

## 2010-10-06 DIAGNOSIS — M545 Low back pain: Secondary | ICD-10-CM

## 2010-10-06 DIAGNOSIS — IMO0002 Reserved for concepts with insufficient information to code with codable children: Secondary | ICD-10-CM

## 2010-10-07 ENCOUNTER — Other Ambulatory Visit: Payer: Medicare Other

## 2010-10-17 ENCOUNTER — Inpatient Hospital Stay: Admission: RE | Admit: 2010-10-17 | Payer: Medicare Other | Source: Ambulatory Visit

## 2010-10-24 ENCOUNTER — Inpatient Hospital Stay: Admission: RE | Admit: 2010-10-24 | Payer: Medicare Other | Source: Ambulatory Visit

## 2010-10-29 ENCOUNTER — Emergency Department (HOSPITAL_COMMUNITY)
Admission: EM | Admit: 2010-10-29 | Discharge: 2010-10-29 | Disposition: A | Discharge status: Home / Self Care | Payer: Medicare Other | Attending: Emergency Medicine | Admitting: Emergency Medicine

## 2010-10-29 DIAGNOSIS — M79609 Pain in unspecified limb: Secondary | ICD-10-CM | POA: Insufficient documentation

## 2010-10-29 DIAGNOSIS — IMO0002 Reserved for concepts with insufficient information to code with codable children: Secondary | ICD-10-CM | POA: Insufficient documentation

## 2010-10-29 DIAGNOSIS — M545 Low back pain, unspecified: Secondary | ICD-10-CM | POA: Insufficient documentation

## 2010-10-29 DIAGNOSIS — N393 Stress incontinence (female) (male): Secondary | ICD-10-CM | POA: Insufficient documentation

## 2010-10-29 DIAGNOSIS — F319 Bipolar disorder, unspecified: Secondary | ICD-10-CM | POA: Insufficient documentation

## 2010-10-29 DIAGNOSIS — I10 Essential (primary) hypertension: Secondary | ICD-10-CM | POA: Insufficient documentation

## 2010-10-29 DIAGNOSIS — J4489 Other specified chronic obstructive pulmonary disease: Secondary | ICD-10-CM | POA: Insufficient documentation

## 2010-10-29 DIAGNOSIS — J449 Chronic obstructive pulmonary disease, unspecified: Secondary | ICD-10-CM | POA: Insufficient documentation

## 2011-06-02 ENCOUNTER — Encounter (HOSPITAL_COMMUNITY): Payer: Self-pay

## 2011-06-02 ENCOUNTER — Emergency Department (HOSPITAL_COMMUNITY)
Admission: EM | Admit: 2011-06-02 | Discharge: 2011-06-07 | Disposition: A | Discharge status: Home / Self Care | Payer: Medicare Other | Attending: Emergency Medicine | Admitting: Emergency Medicine

## 2011-06-02 DIAGNOSIS — K219 Gastro-esophageal reflux disease without esophagitis: Secondary | ICD-10-CM | POA: Insufficient documentation

## 2011-06-02 DIAGNOSIS — R4689 Other symptoms and signs involving appearance and behavior: Secondary | ICD-10-CM

## 2011-06-02 DIAGNOSIS — R45850 Homicidal ideations: Secondary | ICD-10-CM | POA: Insufficient documentation

## 2011-06-02 DIAGNOSIS — F319 Bipolar disorder, unspecified: Secondary | ICD-10-CM | POA: Insufficient documentation

## 2011-06-02 DIAGNOSIS — Z8659 Personal history of other mental and behavioral disorders: Secondary | ICD-10-CM | POA: Insufficient documentation

## 2011-06-02 DIAGNOSIS — Z79899 Other long term (current) drug therapy: Secondary | ICD-10-CM | POA: Insufficient documentation

## 2011-06-02 DIAGNOSIS — F489 Nonpsychotic mental disorder, unspecified: Secondary | ICD-10-CM | POA: Insufficient documentation

## 2011-06-02 DIAGNOSIS — N390 Urinary tract infection, site not specified: Secondary | ICD-10-CM

## 2011-06-02 DIAGNOSIS — F603 Borderline personality disorder: Secondary | ICD-10-CM | POA: Insufficient documentation

## 2011-06-02 DIAGNOSIS — I10 Essential (primary) hypertension: Secondary | ICD-10-CM | POA: Insufficient documentation

## 2011-06-02 HISTORY — DX: Essential (primary) hypertension: I10

## 2011-06-02 HISTORY — DX: Schizophrenia, unspecified: F20.9

## 2011-06-02 HISTORY — DX: Fibromyalgia: M79.7

## 2011-06-02 HISTORY — DX: Anxiety disorder, unspecified: F41.9

## 2011-06-02 HISTORY — DX: Bipolar disorder, unspecified: F31.9

## 2011-06-02 HISTORY — DX: Gastro-esophageal reflux disease without esophagitis: K21.9

## 2011-06-02 MED ORDER — QUETIAPINE FUMARATE 100 MG PO TABS
400.0000 mg | ORAL_TABLET | Freq: Every day | ORAL | Status: DC
  Administered 2011-06-02 – 2011-06-06 (×5): 400 mg via ORAL
  Filled 2011-06-02 (×5): qty 4

## 2011-06-02 MED ORDER — HYDROCODONE-ACETAMINOPHEN 10-325 MG PO TABS
1.0000 | ORAL_TABLET | Freq: Once | ORAL | Status: DC
  Filled 2011-06-02: qty 1

## 2011-06-02 MED ORDER — HYDROCHLOROTHIAZIDE 25 MG PO TABS
25.0000 mg | ORAL_TABLET | Freq: Every day | ORAL | Status: DC
  Administered 2011-06-02 – 2011-06-07 (×6): 25 mg via ORAL
  Filled 2011-06-02 (×8): qty 1

## 2011-06-02 MED ORDER — ACETAMINOPHEN 325 MG PO TABS
650.0000 mg | ORAL_TABLET | ORAL | Status: DC | PRN
  Administered 2011-06-04 – 2011-06-07 (×4): 650 mg via ORAL
  Filled 2011-06-02 (×4): qty 2

## 2011-06-02 MED ORDER — HYDROCODONE-ACETAMINOPHEN 5-325 MG PO TABS
2.0000 | ORAL_TABLET | Freq: Once | ORAL | Status: AC
  Administered 2011-06-02: 2 via ORAL
  Filled 2011-06-02: qty 2

## 2011-06-02 MED ORDER — ALUM & MAG HYDROXIDE-SIMETH 200-200-20 MG/5ML PO SUSP
30.0000 mL | ORAL | Status: DC | PRN
  Administered 2011-06-06: 30 mL via ORAL
  Filled 2011-06-02: qty 30

## 2011-06-02 MED ORDER — NICOTINE 21 MG/24HR TD PT24
21.0000 mg | MEDICATED_PATCH | Freq: Every day | TRANSDERMAL | Status: DC
  Administered 2011-06-03 – 2011-06-07 (×5): 21 mg via TRANSDERMAL
  Filled 2011-06-02 (×5): qty 1

## 2011-06-02 MED ORDER — CITALOPRAM HYDROBROMIDE 40 MG PO TABS
40.0000 mg | ORAL_TABLET | Freq: Every day | ORAL | Status: DC
  Administered 2011-06-02 – 2011-06-03 (×2): 40 mg via ORAL
  Filled 2011-06-02 (×4): qty 1

## 2011-06-02 MED ORDER — IBUPROFEN 600 MG PO TABS
600.0000 mg | ORAL_TABLET | Freq: Three times a day (TID) | ORAL | Status: DC | PRN
  Administered 2011-06-02 – 2011-06-07 (×6): 600 mg via ORAL
  Filled 2011-06-02 (×3): qty 1
  Filled 2011-06-02: qty 3
  Filled 2011-06-02 (×2): qty 1

## 2011-06-02 MED ORDER — ONDANSETRON HCL 4 MG PO TABS: 4.0000 mg | ORAL_TABLET | Freq: Three times a day (TID) | ORAL | Status: DC | PRN

## 2011-06-02 MED ORDER — ZOLPIDEM TARTRATE 5 MG PO TABS
5.0000 mg | ORAL_TABLET | Freq: Every evening | ORAL | Status: DC | PRN
  Administered 2011-06-02 – 2011-06-06 (×5): 5 mg via ORAL
  Filled 2011-06-02 (×5): qty 1

## 2011-06-02 MED ORDER — LAMOTRIGINE 200 MG PO TABS
200.0000 mg | ORAL_TABLET | Freq: Every day | ORAL | Status: DC
  Administered 2011-06-02 – 2011-06-04 (×3): 200 mg via ORAL
  Filled 2011-06-02 (×4): qty 1

## 2011-06-02 NOTE — ED Notes (Signed)
Loud noises heard from pt's room and pt seen throwing her remote control on the floor. Remote control is broken and pt is verbally aggressive towards staff. Table, chair and trash can removed from room, remote control discarded. Pt states she is sick and tired of being without her medicine and feels like killing someone. States she is waiting to be placed somewhere that can get her back on her medicine, and she wants an "A" team to come check on her at her house twice a week. During the same conversation, pt states she had all of her medicines this morning, but has been out of the Citalopram for about a week and a half and that it makes her feel horrible. Also reports that she takes Lorcet 10/325 PO BID for back pain. States she has had lower back pain for 20 years and had a laminectomy in Feb 2013. Also requesting Klonopin 1mg  PO BID for anxiety. EDP (PA) aware of med requests.

## 2011-06-02 NOTE — ED Notes (Signed)
Patient reports that she is out of her medications. Patient verbally expresses that she is angry and wants to push anybody down the stairs. Patient also states that she asways has a suicide plan and has had one for 20 years

## 2011-06-02 NOTE — ED Provider Notes (Signed)
History     CSN: [id number]  Arrival date & time 06/02/11  1601   First MD Initiated Contact with Patient 06/02/11 1653      Chief Complaint  Patient presents with  . Medical Clearance    (Consider location/radiation/quality/duration/timing/severity/associated sxs/prior treatment) HPI  Pt presents to the ED with complaints of suicidal ideation and homicidal ideation. She admits to being off of her medications for "a while". The patient is VERY agitated and VERY aggressive. [employer] is outside the door right now. She states that she plans to kill everyone in her family and if they were all dead right now she would go to their grave and spit on it. She proceeds to tell me that I am patronizing her by being in the room and that she is going to add me to the list of people she wants to kill. She states that everything she does is premeditated, she thinks really long and hard before she does it. She is currently here voluntarily and is not IVC'd.  Past Medical History  Diagnosis Date  . Bipolar 1 disorder   . Schizophrenia   . Anxiety   . Fibromyalgia   . GERD (gastroesophageal reflux disease)   . Hypertension     Past Surgical History  Procedure Date  . Back surgery     No family history on file.  History  Substance Use Topics  . Smoking status: Current Everyday Smoker  . Smokeless tobacco: Not on file  . Alcohol Use: No    OB History    Grav Para Term Preterm Abortions TAB SAB Ect Mult Living                  Review of Systems  Unable to perform ROS   Allergies  Prednisone  Home Medications   Current Outpatient Rx  Name Route Sig Dispense Refill  . CITALOPRAM HYDROBROMIDE 40 MG PO TABS Oral Take 40 mg by mouth daily.    Marland Kitchen CLONAZEPAM 1 MG PO TABS Oral Take 1 mg by mouth 2 (two) times daily as needed. For anxiety.    Marland Kitchen HYDROCHLOROTHIAZIDE 25 MG PO TABS Oral Take 25 mg by mouth daily.    Marland Kitchen HYDROCODONE-ACETAMINOPHEN 10-650 MG PO TABS Oral Take 1 tablet by mouth  every 6 (six) hours as needed. For pain.    Marland Kitchen LAMOTRIGINE 200 MG PO TABS Oral Take 200 mg by mouth daily.    . QUETIAPINE FUMARATE 200 MG PO TABS Oral Take 400 mg by mouth at bedtime.    Marland Kitchen RANITIDINE HCL 150 MG PO TABS Oral Take 150 mg by mouth 2 (two) times daily.      BP 135/83  Pulse 82  Temp(Src) 98 F (36.7 C) (Oral)  Resp 18  SpO2 98%  Physical Exam  Nursing note and vitals reviewed. Constitutional: She appears well-developed and well-nourished. No distress.  HENT:  Head: Normocephalic and atraumatic.  Eyes: Pupils are equal, round, and reactive to light.  Neck: Normal range of motion. Neck supple.  Cardiovascular: Normal rate and regular rhythm.   Pulmonary/Chest: Effort normal.  Abdominal: Soft.  Neurological: She is alert.  Skin: Skin is warm and dry.  Psychiatric: Her affect is angry. She is agitated, aggressive and combative. She expresses impulsivity and inappropriate judgment. She expresses homicidal and suicidal ideation. She expresses suicidal plans and homicidal plans.    ED Course  Procedures (including critical care time)  Labs Reviewed - No data to display No results found.  1. Homicidal ideations   2. Suicidal behavior   3. Aggressive behavior       MDM  ACT to consult         Dorthula Matas, PA 06/02/11 1747

## 2011-06-02 NOTE — BH Assessment (Signed)
Assessment Note   [name] is a 46 y.o. female who reports voluntarily after reportedly threatening to kill her daughter this morning during breakfast. Pt reports she has a history of bipolar disorder since she was 46 years old. She states she has been off her medications for 2 weeks due to her believing that the medications are making her go blind. Pt states she is taking Lamictal, Seroquel, and Klonopin. Pt reports she use to take Paxil and reports she feels that it helped alleviate mood swings. Pt states that she feels angry and is having "seriously violent thoughts." Pt reports she has been having rapid mood swings where she will start "crying over nothing" and then "want to cause someone pain." pt states she has been unable to sleep for several days and feels restless. She states she feels like her "brain is jittery" and is hearing a ringing in her ears. Pt reports numerous hospitlizations including [facility], pt could not recall dates of inpatient stays. Pt also reports she recently was released from jail but would not give details as to why she was incarcerated or if she has an upcoming court date. Pt has no current out patient psychiatrist or therapist.   Pt also endorses SI, stating she thought about hurting herself earlier today to keep her from hurting others. Pt denies [facility], however, pt states that people have been talking to her through the TV. Pt endorses beliefs of paranoia, stating she feels like she is being watched. Pt appears restless, verbally aggressive, and speaks with pressured speech. Pt states that feels anxious being "cooped up." Pt also requested a sheet from RN to cover the TV. Pt is seeking inpatient treatment as she feels she is not safe to others or herself at this time. Pt also states she wants treatment so that her "grandchildren don't see me this way." She reports her young grandchildren live in the home with her and her daughter.           Axis I: Bipolar 1 Disorder,  Severe with Psychotic Features  Axis II: Deferred Axis III:  Past Medical History  Diagnosis Date  . Bipolar 1 disorder   . Schizophrenia   . Anxiety   . Fibromyalgia   . GERD (gastroesophageal reflux disease)   . Hypertension    Axis IV: other psychosocial or environmental problems Axis V: 21-30 behavior considerably influenced by delusions or hallucinations OR serious impairment in judgment, communication OR inability to function in almost all areas  Past Medical History:  Past Medical History  Diagnosis Date  . Bipolar 1 disorder   . Schizophrenia   . Anxiety   . Fibromyalgia   . GERD (gastroesophageal reflux disease)   . Hypertension     Past Surgical History  Procedure Date  . Back surgery     Family History: No family history on file.  Social History:  reports that she has been smoking.  She does not have any smokeless tobacco history on file. She reports that she does not drink alcohol or use illicit drugs.  Additional Social History:  Alcohol / Drug Use History of alcohol / drug use?: No history of alcohol / drug abuse Allergies:  Allergies  Allergen Reactions  . Prednisone Shortness Of Breath and Swelling    Home Medications:  Medications Prior to Admission  Medication Dose Route Frequency Provider Last Rate Last Dose  . acetaminophen (TYLENOL) tablet 650 mg  650 mg Oral Q4H PRN Dorthula Matas, PA      .  alum & mag hydroxide-simeth (MAALOX/MYLANTA) 200-200-20 MG/5ML suspension 30 mL  30 mL Oral PRN Dorthula Matas, PA      . citalopram (CELEXA) tablet 40 mg  40 mg Oral Daily Dorthula Matas, PA   40 mg at 06/02/11 2106  . hydrochlorothiazide (HYDRODIURIL) tablet 25 mg  25 mg Oral Daily Dorthula Matas, PA   25 mg at 06/02/11 2106  . HYDROcodone-acetaminophen (NORCO) 5-325 MG per tablet 2 tablet  2 tablet Oral Once Nelia Shi, MD   2 tablet at 06/02/11 2032  . ibuprofen (ADVIL,MOTRIN) tablet 600 mg  600 mg Oral Q8H PRN Dorthula Matas, PA   600 mg  at 06/02/11 1831  . lamoTRIgine (LAMICTAL) tablet 200 mg  200 mg Oral Daily Dorthula Matas, PA   200 mg at 06/02/11 2106  . nicotine (NICODERM CQ - dosed in mg/24 hours) patch 21 mg  21 mg Transdermal Daily Dorthula Matas, PA      . ondansetron (ZOFRAN) tablet 4 mg  4 mg Oral Q8H PRN Dorthula Matas, PA      . QUEtiapine (SEROQUEL) tablet 400 mg  400 mg Oral QHS Dorthula Matas, PA   400 mg at 06/02/11 2108  . zolpidem (AMBIEN) tablet 5 mg  5 mg Oral QHS PRN Dorthula Matas, PA   5 mg at 06/02/11 2108  . DISCONTD: HYDROcodone-acetaminophen (NORCO) 10-325 MG per tablet 1 tablet  1 tablet Oral Once Dorthula Matas, PA       Medications Prior to Admission  Medication Sig Dispense Refill  . citalopram (CELEXA) 40 MG tablet Take 40 mg by mouth daily.      . hydrochlorothiazide (HYDRODIURIL) 25 MG tablet Take 25 mg by mouth daily.      Marland Kitchen lamoTRIgine (LAMICTAL) 200 MG tablet Take 200 mg by mouth daily.      . ranitidine (ZANTAC) 150 MG tablet Take 150 mg by mouth 2 (two) times daily.        OB/GYN Status:  No LMP recorded. Patient is postmenopausal.  General Assessment Data Location of Assessment: [facility] Living Arrangements: Other relatives Can pt return to current living arrangement?: Yes Admission Status: Voluntary Is patient capable of signing voluntary admission?: Yes Transfer from: Acute Hospital Referral Source: Self/Family/Friend  Education Status Is patient currently in school?: No  Risk to self Suicidal Ideation: Yes-Currently Present Suicidal Intent: No Is patient at risk for suicide?: Yes Suicidal Plan?: No Access to Means: No What has been your use of drugs/alcohol within the last 12 months?: denies Previous Attempts/Gestures: Yes How many times?:  (reports several) Triggers for Past Attempts: Unpredictable Intentional Self Injurious Behavior: None Family Suicide History: No Recent stressful life event(s): Conflict (Comment) (with family members) Persecutory  voices/beliefs?: Yes Depression: Yes Depression Symptoms: Tearfulness;Insomnia;Loss of interest in usual pleasures;Feeling angry/irritable Substance abuse history and/or treatment for substance abuse?: No Suicide prevention information given to non-admitted patients: Not applicable  Risk to Others Homicidal Ideation: Yes-Currently Present Thoughts of Harm to Others: Yes-Currently Present Comment - Thoughts of Harm to Others: reports wants to hurt family members Current Homicidal Intent: Yes-Currently Present Current Homicidal Plan: No Access to Homicidal Means: Yes Describe Access to Homicidal Means: kinves at home Identified Victim: family members History of harm to others?: Yes Assessment of Violence: None Noted Violent Behavior Description: throwing objects Does patient have access to weapons?: No Criminal Charges Pending?: Yes Describe Pending Criminal Charges:  (reports she just got relesed from jail, wou'd  not give detai) Does patient have a court date:  (unknown)  Psychosis Hallucinations: Auditory Delusions: None noted  Mental Status Report Appear/Hygiene: Disheveled Eye Contact: Good Motor Activity: Restlessness Speech: Aggressive;Logical/coherent Level of Consciousness: Alert Mood: Angry Affect: Angry Anxiety Level: None Thought Processes: Coherent;Relevant Judgement: Impaired Orientation: Person;Place;Time;Situation Obsessive Compulsive Thoughts/Behaviors: None  Cognitive Functioning Concentration: Normal Memory: Recent Intact IQ: Average Insight: Fair Impulse Control: Poor Appetite: Fair Weight Loss: 0  Weight Gain: 0  Sleep: Decreased Total Hours of Sleep:  (reports disrupted sleep) Vegetative Symptoms: None  Prior Inpatient Therapy Prior Inpatient Therapy: Yes Prior Therapy Dates:  (reports she can not remember) Prior Therapy Facilty/Provider(s): repoprts several stays including [facility] Reason for Treatment: bipolar disorder  Prior Outpatient  Therapy Prior Outpatient Therapy: Yes Prior Therapy Dates: states years ago Reason for Treatment: bipolar disorder  ADL Screening (condition at time of admission) Patient's cognitive ability adequate to safely complete daily activities?: Yes Patient able to express need for assistance with ADLs?: Yes Independently performs ADLs?: Yes Weakness of Legs: None Weakness of Arms/Hands: None  Home Assistive Devices/Equipment Home Assistive Devices/Equipment: None    Abuse/Neglect Assessment (Assessment to be complete while patient is alone) Physical Abuse: Denies Verbal Abuse: Denies Sexual Abuse: Denies Exploitation of patient/patient's resources: Denies Self-Neglect: Denies Values / Beliefs Cultural Requests During Hospitalization: None Spiritual Requests During Hospitalization: None   Advance Directives (For Healthcare) Advance Directive: Patient does not have advance directive Nutrition Screen Diet: Regular Unintentional weight loss greater than 10lbs within the last month: No Problems chewing or swallowing foods and/or liquids: No Home Tube Feeding or Total Parenteral Nutrition (TPN): No Patient appears severely malnourished: No Pregnant or Lactating: No  Additional Information 1:1 In Past 12 Months?: No CIRT Risk: Yes Elopement Risk: No Does patient have medical clearance?: Yes     Disposition:  Disposition Disposition of Patient: Referred to;Inpatient treatment program  On Site Evaluation by:   Reviewed with Physician:     Marjean Donna 06/02/2011 9:39 PM

## 2011-06-03 DIAGNOSIS — F302 Manic episode, severe with psychotic symptoms: Secondary | ICD-10-CM

## 2011-06-03 MED ORDER — HYDROCODONE-ACETAMINOPHEN 10-325 MG PO TABS
1.0000 | ORAL_TABLET | Freq: Two times a day (BID) | ORAL | Status: DC | PRN
  Administered 2011-06-03 – 2011-06-06 (×7): 1 via ORAL
  Filled 2011-06-03 (×7): qty 1

## 2011-06-03 MED ORDER — PAROXETINE HCL ER 25 MG PO TB24
25.0000 mg | ORAL_TABLET | Freq: Every day | ORAL | Status: DC
  Administered 2011-06-04 – 2011-06-07 (×4): 25 mg via ORAL
  Filled 2011-06-03 (×5): qty 1

## 2011-06-03 MED ORDER — ALBUTEROL SULFATE HFA 108 (90 BASE) MCG/ACT IN AERS
2.0000 | INHALATION_SPRAY | RESPIRATORY_TRACT | Status: DC | PRN
  Administered 2011-06-03 – 2011-06-05 (×3): 2 via RESPIRATORY_TRACT
  Filled 2011-06-03: qty 6.7

## 2011-06-03 MED ORDER — CLONAZEPAM 1 MG PO TABS
1.0000 mg | ORAL_TABLET | Freq: Two times a day (BID) | ORAL | Status: DC | PRN
  Administered 2011-06-03 – 2011-06-07 (×9): 1 mg via ORAL
  Filled 2011-06-03 (×9): qty 1

## 2011-06-03 NOTE — ED Provider Notes (Signed)
  Physical Exam  BP 107/71  Pulse 80  Temp(Src) 97.9 F (36.6 C) (Oral)  Resp 18  SpO2 99%  Physical Exam  ED Course  Procedures  MDM Patient has been requesting Klonopin and Lorcet. [place] drug database is reviewed and she was on these medicines at home. She was given 60 pills of each for a month supply. He was started on a when necessary order      Juliet Rude. Rubin Payor, MD 06/03/11 [phone]

## 2011-06-03 NOTE — Consult Note (Signed)
Reason for Consult: Bipolar disorder recent episode psychosis and mania Referring Physician: Dr. Octavio [name] is an 46 y.o. female.  HPI: This is a 46 years old Caucasian female who was brought in to the [facility] emergency psychiatric services on involuntary commitment petition for increased irritability, agitation, aggressive behavior since stopped her medication about 2 weeks ago. Patient felt her medication making her go blind and the making ringing sound in her ears. Patient reported that her brain is feeling tremors which made her having a bad thoughts about hurting other people. Patient was brought in by her 54 years old daughter for help. Patient was hospitalized at least 20 times in [facility] and also has previous hospitalization behavioral health center. Patient does not have a psychiatrist in [place] but has been receiving medication from the outpatient family doctor Dr. Mayford [name] on [address] [address] Patient has been diagnosed with bipolar disorder since he was 46 years old. She was staying with her mother and daughter who is 33 years old and her 3 grandchildren. She does not get along with her sister. Patient stated she never completed school secondary to her health issues and been on disability. Patient was married twice but did not work out. she has the 2 children 71 and 17 years old. She does not have contact with her older daughter. Patient was seeing a psychiatrist in [facility] in [place] and had an argument and did not follow through and patient used to see ACT team in the past.  Past Medical History  Diagnosis Date  . Bipolar 1 disorder   . Schizophrenia   . Anxiety   . Fibromyalgia   . GERD (gastroesophageal reflux disease)   . Hypertension     Past Surgical History  Procedure Date  . Back surgery     No family history on file.  Social History:  reports that she has been  smoking.  She does not have any smokeless tobacco history on file. She reports that she does not drink alcohol or use illicit drugs.  Allergies:  Allergies  Allergen Reactions  . Prednisone Shortness Of Breath and Swelling    Medications: I have reviewed the patient's current medications.  No results found for this or any previous visit (from the past 48 hour(s)).  No results found.  Positive for bad mood, behavior problems, bipolar, depression, good mood, mood swings and sleep disturbance Blood pressure 115/78, pulse 78, temperature 97.9 F (36.6 C), temperature source Oral, resp. rate 16, SpO2 96.00%.   Assessment/Plan: Bipolar disorder, severe with psychotic features Noncompliance with treatment  Patient medication inpatient psychiatric hospitalization for stem safety and stability Patient Will restart her home medication and change her citalopram to Paxil CR 25 mg daily as requested  [name],Kristie R. 06/03/2011, 5:25 PM

## 2011-06-04 DIAGNOSIS — F312 Bipolar disorder, current episode manic severe with psychotic features: Secondary | ICD-10-CM

## 2011-06-04 MED ORDER — LAMOTRIGINE 100 MG PO TABS
100.0000 mg | ORAL_TABLET | Freq: Every day | ORAL | Status: DC
  Administered 2011-06-05 – 2011-06-07 (×3): 100 mg via ORAL
  Filled 2011-06-04 (×4): qty 1

## 2011-06-04 MED ORDER — GABAPENTIN 300 MG PO CAPS
300.0000 mg | ORAL_CAPSULE | ORAL | Status: DC
  Administered 2011-06-04 – 2011-06-07 (×8): 300 mg via ORAL
  Filled 2011-06-04 (×10): qty 1

## 2011-06-04 MED ORDER — GABAPENTIN 600 MG PO TABS
300.0000 mg | ORAL_TABLET | Freq: Two times a day (BID) | ORAL | Status: DC
  Filled 2011-06-04 (×2): qty 0.5

## 2011-06-04 NOTE — ED Provider Notes (Signed)
Medical screening examination/treatment/procedure(s) were performed by non-physician practitioner and as supervising physician I was immediately available for consultation/collaboration.    Katlen Seyer L Roselind Klus, MD 06/04/11 2035 

## 2011-06-04 NOTE — Progress Notes (Addendum)
Pt attended behavioral health ED group facilitated by chaplain and doctoral counseling intern.   Group focused on courage.  After checking in with one another, participants worked to Economist ideas of courage and connect to specific times in their lives where they have had to be courageous.  Pt's completed an activity where they reflected on their concrete examples of courage and wrote or drew and example of how they could be courageous in their present circumstances.    Was very talkative in group with rapid, pressured speech.  Pt moved to monopolize time and had to be redirected to open space for other members to speak.   Pt was responsive to redirection.  Pt had positive interaction with another member of the group, connecting to this member's definitions of courage and responding to her encouragement.   Pt finished worksheet quickly, but participated in entire group.  She related that she finds courage in herself and gave several examples before speaking about how she does not feel courage at this moment.  Pt went on to explore with the group how she finds courage in her grandchildren, for whom she wishes to be a good example.  Pt spoke with the group about her anger and explored with the group the feelings of anger taking her over and "not thinking" about what she is doing.  When talking about ways to redirect her anger, pt consistently communicated that she felt like she needs the right medication.  Pt wrote extensively on her concluding exercise about being an example for her grandchildren and spoke to the other members at the conclusion of group about how they have changed her perspective.    Pt spoke with counseling intern and another pt in hallway following group before returning to room.   Belva Crome MDiv, Chaplain
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
## 2011-06-04 NOTE — ED Provider Notes (Signed)
She feels better with the addition of Paxil to her treatment. She would like to talk to the psychiatrist about adding another medicine and stopping the Lamictal. She denies SI or HI at this time.  Plan is to have psychiatry see her again today on rounds.  She remains under IVC  Flint Melter, MD 06/04/11 [phone]

## 2011-06-05 NOTE — ED Notes (Signed)
Pt back in her room pt reports "Im not going to be told to do anything" pt resting in her bed and then out to the bathroom now back in her room

## 2011-06-05 NOTE — ED Notes (Signed)
Up to the bathroom 

## 2011-06-05 NOTE — ED Notes (Signed)
Greg CSW in w/ pt 

## 2011-06-05 NOTE — ED Provider Notes (Signed)
BP 110/76  Pulse 80  Temp(Src) 97.4 F (36.3 C) (Oral)  Resp 20  SpO2 97% No acute distress. Resting well. No issues overnight. Awaiting [facility] review.    Loren Racer, MD 06/05/11 [phone]

## 2011-06-05 NOTE — BH Assessment (Signed)
Authorization #[id number] given by Theodoro Grist at [facility].

## 2011-06-05 NOTE — ED Notes (Signed)
CSW in w/ pt 
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
## 2011-06-06 ENCOUNTER — Encounter (HOSPITAL_COMMUNITY): Payer: Self-pay | Admitting: *Deleted

## 2011-06-06 LAB — CBC
HCT: 41.5 % (ref 36.0–46.0)
Hemoglobin: 13.1 g/dL (ref 12.0–15.0)
MCV: 102.5 fL — ABNORMAL HIGH (ref 78.0–100.0)
RDW: 12.9 % (ref 11.5–15.5)
WBC: 4.7 10*3/uL (ref 4.0–10.5)

## 2011-06-06 LAB — RAPID URINE DRUG SCREEN, HOSP PERFORMED
Amphetamines: NOT DETECTED
Opiates: POSITIVE — AB

## 2011-06-06 LAB — COMPREHENSIVE METABOLIC PANEL
AST: 16 U/L (ref 0–37)
BUN: 23 mg/dL (ref 6–23)
CO2: 32 mEq/L (ref 19–32)
Calcium: 9.8 mg/dL (ref 8.4–10.5)
Chloride: 102 mEq/L (ref 96–112)
Creatinine, Ser: 1.05 mg/dL (ref 0.50–1.10)
GFR calc Af Amer: 72 mL/min — ABNORMAL LOW (ref >90)
GFR calc non Af Amer: 62 mL/min — ABNORMAL LOW (ref >90)
Glucose, Bld: 91 mg/dL (ref 70–99)
Total Bilirubin: 0.3 mg/dL (ref 0.3–1.2)

## 2011-06-06 LAB — DIFFERENTIAL
Basophils Absolute: 0 10*3/uL (ref 0.0–0.1)
Eosinophils Relative: 3 % (ref 0–5)
Lymphocytes Relative: 36 % (ref 12–46)
Monocytes Absolute: 0.4 10*3/uL (ref 0.1–1.0)
Monocytes Relative: 9 % (ref 3–12)

## 2011-06-06 LAB — URINALYSIS, ROUTINE W REFLEX MICROSCOPIC
Nitrite: NEGATIVE
Protein, ur: NEGATIVE mg/dL
Specific Gravity, Urine: 1.015 (ref 1.005–1.030)
Urobilinogen, UA: 0.2 mg/dL (ref 0.0–1.0)

## 2011-06-06 LAB — URINE MICROSCOPIC-ADD ON

## 2011-06-06 LAB — PREGNANCY, URINE: Preg Test, Ur: NEGATIVE

## 2011-06-06 MED ORDER — CIPROFLOXACIN HCL 250 MG PO TABS
250.0000 mg | ORAL_TABLET | Freq: Once | ORAL | Status: AC
  Administered 2011-06-06: 250 mg via ORAL
  Filled 2011-06-06: qty 1

## 2011-06-06 MED ORDER — CARISOPRODOL 350 MG PO TABS
350.0000 mg | ORAL_TABLET | Freq: Three times a day (TID) | ORAL | Status: DC
  Administered 2011-06-06 – 2011-06-07 (×4): 350 mg via ORAL
  Filled 2011-06-06 (×4): qty 1

## 2011-06-06 MED ORDER — CIPROFLOXACIN HCL 250 MG PO TABS
250.0000 mg | ORAL_TABLET | Freq: Two times a day (BID) | ORAL | Status: DC
  Administered 2011-06-06 – 2011-06-07 (×2): 250 mg via ORAL
  Filled 2011-06-06 (×2): qty 1

## 2011-06-06 MED ORDER — HYDROCODONE-ACETAMINOPHEN 10-325 MG PO TABS
1.0000 | ORAL_TABLET | Freq: Four times a day (QID) | ORAL | Status: DC | PRN
  Administered 2011-06-06 – 2011-06-07 (×5): 1 via ORAL
  Filled 2011-06-06 (×5): qty 1

## 2011-06-06 NOTE — ED Notes (Signed)
Labs currently being drawn.

## 2011-06-06 NOTE — BH Assessment (Signed)
Spoke with [name] at [facility] to confirm receipt of faxed referral form and attached info. He said that MD will review referral form and [facility] will call [facility] back re: whether pt will be on wait list.

## 2011-06-06 NOTE — ED Notes (Signed)
Up to the desk on the phone 

## 2011-06-06 NOTE — ED Notes (Signed)
Up to the bathroom 

## 2011-06-06 NOTE — ED Notes (Signed)
Dr webb into see 

## 2011-06-06 NOTE — ED Notes (Signed)
Linens changed.

## 2011-06-06 NOTE — ED Notes (Signed)
Reports relief w/ maalox

## 2011-06-06 NOTE — ED Notes (Signed)
Up tot he bathroom to shower and change scrubs 

## 2011-06-06 NOTE — ED Notes (Signed)
CSW-Greg-in w/ pt
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
## 2011-06-06 NOTE — ED Provider Notes (Signed)
BP 104/64  Pulse 66  Temp(Src) 98 F (36.7 C) (Oral)  Resp 20  SpO2 97%  Patient seen and evaluated by me. No complaints at this time. States that she is not getting norco frequently enough for her chronic back pain x years which is unchanged. Noted to have no medical clearance labs. Ordered. Patient medically cleared. Will treat UTI with ciprofloxacin.    Forbes Cellar, MD 06/06/11 [phone]

## 2011-06-06 NOTE — ED Notes (Signed)
Up to the phone 

## 2011-06-06 NOTE — ED Notes (Signed)
Up to the desk c/o GERD after taking the soma, nad

## 2011-06-07 DIAGNOSIS — F3110 Bipolar disorder, current episode manic without psychotic features, unspecified: Secondary | ICD-10-CM

## 2011-06-07 MED ORDER — GABAPENTIN 300 MG PO CAPS: 300.0000 mg | ORAL_CAPSULE | Freq: Two times a day (BID) | ORAL | Status: DC

## 2011-06-07 MED ORDER — PAROXETINE HCL 20 MG PO TABS: 20.0000 mg | ORAL_TABLET | ORAL | Status: DC

## 2011-06-07 MED ORDER — HYDROCODONE-ACETAMINOPHEN 5-325 MG PO TABS: 2.0000 | ORAL_TABLET | Freq: Three times a day (TID) | ORAL | Status: AC | PRN

## 2011-06-07 NOTE — Discharge Instructions (Signed)
You have signs of possible anxiety and/or depression. This is a very common problem.  Be sure to call your caregiver and arrange for follow-up care as suggested by our staff. RETURN IMMEDIATELY IF DEVELOP threat to harm self or others, suicidal or homicidal thoughts, hallucinations or confusion, unable to be cared for at home or uncontrolled behavior, or other concerns.  Chronic Pain Discharge Instructions  Emergency care providers appreciate that many patients coming to [place] are in severe pain and we wish to address their pain in the safest, most responsible manner.  It is important to recognize however, that the proper treatment of chronic pain differs from that of the pain of injuries and acute illnesses.  Our goal is to provide quality, safe, personalized care and we thank you for giving [place] the opportunity to serve you. The use of narcotics and related agents for chronic pain syndromes may lead to additional physical and psychological problems.  Nearly as many people die from prescription narcotics each year as die from car crashes.  Additionally, this risk is increased if such prescriptions are obtained from a variety of sources.  Therefore, only your primary care physician or a pain management specialist is able to safely treat such syndromes with narcotic medications long-term.    Documentation revealing such prescriptions have been sought from multiple sources may prohibit [place] from providing a refill or different narcotic medication.  Your name may be checked first through the [facility] Controlled Substances Reporting System.  This database is a record of controlled substance medication prescriptions that the patient has received.  This has been established by [facility] in an effort to eliminate the dangerous, and often life threatening, practice of obtaining multiple prescriptions from different medical providers.   If you have a chronic pain syndrome (i.e. chronic headaches, recurrent back  or neck pain, dental pain, abdominal or pelvis pain without a specific diagnosis, or neuropathic pain such as fibromyalgia) or recurrent visits for the same condition without an acute diagnosis, you may be treated with non-narcotics and other non-addictive medicines.  Allergic reactions or negative side effects that may be reported by a patient to such medications will not typically lead to the use of a narcotic analgesic or other controlled substance as an alternative.   Patients managing chronic pain with a personal physician should have provisions in place for breakthrough pain.  If you are in crisis, you should call your physician.  If your physician directs you to the emergency department, please have the doctor call and speak to our attending physician concerning your care.   When patients come to the Emergency Department (ED) with acute medical conditions in which the Emergency Department physician feels appropriate to prescribe narcotic or sedating pain medication, the physician will prescribe these in very limited quantities.  The amount of these medications will last only until you can see your primary care physician in his/her office.  Any patient who returns to the ED seeking refills should expect only non-narcotic pain medications.   In the event of an acute medical condition exists and the emergency physician feels it is necessary that the patient be given a narcotic or sedating medication -  a responsible adult driver should be present in the room prior to the medication being given by the nurse.   Prescriptions for narcotic or sedating medications that have been lost, stolen or expired will not be refilled in the Emergency Department.    Patients who have chronic pain may receive non-narcotic prescriptions  until seen by their primary care physician.  It is every patient's personal responsibility to maintain active prescriptions with his or her primary care physician or  specialist.  RESOURCE GUIDE  Dental Problems  Patients with Medicaid: [facility] [phone] [address]                                           [phone] [address] Phone:  [phone]                                                  Phone:  [phone]  If unable to pay or uninsured, contact:  [facility] or [facility]. to become qualified for the adult dental clinic.  Chronic Pain Problems Contact [facility]  [phone] Patients need to be referred by their primary care doctor.  Insufficient Money for Medicine Contact [employer] Way:  call "211" or [facility] 651-142-0335.  No Primary Care Doctor Call [facility]  ([phone] Other agencies that provide inexpensive medical care    [facility]  [phone]    [facility]  [phone]    [facility]  719-734-8237    [facility]  [phone]    [facility]  ([phone]    [facility]  4076001411  Psychological Services [facility]  [phone] [facility]  [phone] [facility]   [phone] (emergency services ([phone])  Substance Abuse Resources [facility]  507-591-9542 [facility] 9312413090 [place] [phone] Floydene Flock [phone] Residential & Outpatient Substance Abuse Program  539-558-5474  Abuse/Neglect [facility] Child Abuse Hotline 670-056-0825 [facility] Child Abuse Hotline 201-040-7290 (After Hours)  Emergency Shelter [facility] (905) 156-5568  Maternity Homes Room at the [place] [facility] [place] 438-826-9997 [facility] 8570126798  MRSA Hotline #:   201-114-6815    [facility]  [facility] [place]     [employer]                          [facility] Dept. [address] [place]                       [address]      [address]  [place]                                                Cristobal Goldmann Phone:  [phone]                                   Phone:  [phone]                 Phone:  [phone]  [facility] Phone:  [phone]  [facility] Child Abuse Hotline 520-084-1169 [phone]) [phone] (After Hours)

## 2011-06-07 NOTE — ED Provider Notes (Signed)
Patient cleared by psychiatry for discharge and patient agrees with plan.  Hurman Horn, MD 06/17/11 [id number]

## 2011-06-07 NOTE — BH Assessment (Signed)
Spoke with Dr. Oneta Rack regarding patients disposition. Sts that he has evaluated patient and recommends discharge home. Writer will discuss follow up options and referral information with patient. Referrals will be given to patient accordingly. Writer will update Dr. Nicolette Bang EDP of patients disposition. Patients nurse-Mike already made aware of patients disposition.

## 2011-06-07 NOTE — ED Notes (Signed)
Discharge instructions reviewed with pt who verbalizes understanding. Paper Rx given for Neurontin, Paxil and Norco. Pt reports she has all other meds at home. Departs in stable and safe condition.

## 2011-06-07 NOTE — Consult Note (Signed)
Reason for Consult: Bipolar mania Referring Physician:  [name] is an 47 y.o. female.  HPI: Patient was seen and the chart reviewed. Patient has been compliant with her medication and has no reported adverse effects. Patient reportedly feeling more stable and able to control her irritability agitation and aggressive behavior. Patient stated she still get angry when somebody is mean to her. Patient patient has denied current or symptoms of for suicidal ideation, homicidal ideation, intentions or plans. She has no history of for substance abuse. Patient has received the Cipro for urinary tract infection during this stay.  Mental status: This and appeared as per her stated age casually trust fairly groomed calm quite cooperative during this visit. Patient reportedly feeling much better since she has been here and and felt rested well. Patient has served good mood with the appropriate bright and full affect. She is normal rate, rhythm and volume of speech. Her thought processes linear and goal-directed but occasionally tangential. Patient has no suicidal or homicidal ideations intentions or plans. Patient has no aberrant auditory or visual hallucinations, delusions or paranoia. Patient has a fair insight and judgment and the impulse control.  Past Medical History  Diagnosis Date  . Bipolar 1 disorder   . Schizophrenia   . Anxiety   . Fibromyalgia   . GERD (gastroesophageal reflux disease)   . Hypertension     Past Surgical History  Procedure Date  . Back surgery     No family history on file.  Social History:  reports that she has been smoking Cigarettes.  She does not have any smokeless tobacco history on file. She reports that she does not drink alcohol or use illicit drugs.  Allergies:  Allergies  Allergen Reactions  . Prednisone Shortness Of Breath and Swelling    Medications: I have reviewed the patient's current medications.  Results for orders placed during  the hospital encounter of 06/02/11 (from the past 48 hour(s))  CBC     Status: Abnormal   Collection Time   06/06/11  8:09 AM      Component Value Range Comment   WBC 4.7  4.0 - 10.5 (K/uL)    RBC 4.05  3.87 - 5.11 (MIL/uL)    Hemoglobin 13.1  12.0 - 15.0 (g/dL)    HCT 16.1  09.6 - 04.5 (%)    MCV 102.5 (*) 78.0 - 100.0 (fL)    MCH 32.3  26.0 - 34.0 (pg)    MCHC 31.6  30.0 - 36.0 (g/dL)    RDW 40.9  81.1 - 91.4 (%)    Platelets 169  150 - 400 (K/uL)   DIFFERENTIAL     Status: Normal   Collection Time   06/06/11  8:09 AM      Component Value Range Comment   Neutrophils Relative 52  43 - 77 (%)    Neutro Abs 2.4  1.7 - 7.7 (K/uL)    Lymphocytes Relative 36  12 - 46 (%)    Lymphs Abs 1.7  0.7 - 4.0 (K/uL)    Monocytes Relative 9  3 - 12 (%)    Monocytes Absolute 0.4  0.1 - 1.0 (K/uL)    Eosinophils Relative 3  0 - 5 (%)    Eosinophils Absolute 0.2  0.0 - 0.7 (K/uL)    Basophils Relative 1  0 - 1 (%)    Basophils Absolute 0.0  0.0 - 0.1 (K/uL)   COMPREHENSIVE METABOLIC PANEL     Status:  Abnormal   Collection Time   06/06/11  8:09 AM      Component Value Range Comment   Sodium 143  135 - 145 (mEq/L)    Potassium 4.9  3.5 - 5.1 (mEq/L)    Chloride 102  96 - 112 (mEq/L)    CO2 32  19 - 32 (mEq/L)    Glucose, Bld 91  70 - 99 (mg/dL)    BUN 23  6 - 23 (mg/dL)    Creatinine, Ser 4.09  0.50 - 1.10 (mg/dL)    Calcium 9.8  8.4 - 10.5 (mg/dL)    Total Protein 7.3  6.0 - 8.3 (g/dL)    Albumin 3.9  3.5 - 5.2 (g/dL)    AST 16  0 - 37 (U/L)    ALT 12  0 - 35 (U/L)    Alkaline Phosphatase 74  39 - 117 (U/L)    Total Bilirubin 0.3  0.3 - 1.2 (mg/dL)    GFR calc non Af Amer 62 (*) >90 (mL/min)    GFR calc Af Amer 72 (*) >90 (mL/min)   URINALYSIS, ROUTINE W REFLEX MICROSCOPIC     Status: Abnormal   Collection Time   06/06/11  8:29 AM      Component Value Range Comment   Color, Urine YELLOW  YELLOW     APPearance CLOUDY (*) CLEAR     Specific Gravity, Urine 1.015  1.005 - 1.030     pH 6.0   5.0 - 8.0     Glucose, UA NEGATIVE  NEGATIVE (mg/dL)    Hgb urine dipstick TRACE (*) NEGATIVE     Bilirubin Urine NEGATIVE  NEGATIVE     Ketones, ur NEGATIVE  NEGATIVE (mg/dL)    Protein, ur NEGATIVE  NEGATIVE (mg/dL)    Urobilinogen, UA 0.2  0.0 - 1.0 (mg/dL)    Nitrite NEGATIVE  NEGATIVE     Leukocytes, UA LARGE (*) NEGATIVE    PREGNANCY, URINE     Status: Normal   Collection Time   06/06/11  8:29 AM      Component Value Range Comment   Preg Test, Ur NEGATIVE  NEGATIVE    URINE MICROSCOPIC-ADD ON     Status: Abnormal   Collection Time   06/06/11  8:29 AM      Component Value Range Comment   Squamous Epithelial / LPF RARE  RARE     WBC, UA 11-20  <3 (WBC/hpf)    RBC / HPF 0-2  <3 (RBC/hpf)    Bacteria, UA MANY (*) RARE    URINE RAPID DRUG SCREEN (HOSP PERFORMED)     Status: Abnormal   Collection Time   06/06/11  8:34 AM      Component Value Range Comment   Opiates POSITIVE (*) NONE DETECTED     Cocaine NONE DETECTED  NONE DETECTED     Benzodiazepines POSITIVE (*) NONE DETECTED     Amphetamines NONE DETECTED  NONE DETECTED     Tetrahydrocannabinol NONE DETECTED  NONE DETECTED     Barbiturates NONE DETECTED  NONE DETECTED      No results found.  No depression, No anxiety and No psychosis Blood pressure 103/65, pulse 68, temperature 97.5 F (36.4 C), temperature source Oral, resp. rate 18, SpO2 99.00%.   Assessment/Plan: Bipolar disorder, most recent episode mania  Recommended to discharged home with the current medication management and followup with the outpatient treatment team including act team.   [name],[name] 06/07/2011, 4:51 PM

## 2011-07-02 ENCOUNTER — Encounter (HOSPITAL_COMMUNITY): Payer: Self-pay | Admitting: Emergency Medicine

## 2011-07-02 ENCOUNTER — Emergency Department (HOSPITAL_COMMUNITY)
Admission: EM | Admit: 2011-07-02 | Discharge: 2011-07-02 | Disposition: A | Discharge status: Home / Self Care | Payer: Medicare Other | Attending: Emergency Medicine | Admitting: Emergency Medicine

## 2011-07-02 DIAGNOSIS — F319 Bipolar disorder, unspecified: Secondary | ICD-10-CM | POA: Insufficient documentation

## 2011-07-02 DIAGNOSIS — M549 Dorsalgia, unspecified: Secondary | ICD-10-CM | POA: Insufficient documentation

## 2011-07-02 DIAGNOSIS — I10 Essential (primary) hypertension: Secondary | ICD-10-CM | POA: Insufficient documentation

## 2011-07-02 DIAGNOSIS — K219 Gastro-esophageal reflux disease without esophagitis: Secondary | ICD-10-CM | POA: Insufficient documentation

## 2011-07-02 DIAGNOSIS — Z79899 Other long term (current) drug therapy: Secondary | ICD-10-CM | POA: Insufficient documentation

## 2011-07-02 DIAGNOSIS — G8929 Other chronic pain: Secondary | ICD-10-CM | POA: Insufficient documentation

## 2011-07-02 DIAGNOSIS — F411 Generalized anxiety disorder: Secondary | ICD-10-CM | POA: Insufficient documentation

## 2011-07-02 DIAGNOSIS — IMO0001 Reserved for inherently not codable concepts without codable children: Secondary | ICD-10-CM | POA: Insufficient documentation

## 2011-07-02 MED ORDER — HYDROCODONE-ACETAMINOPHEN 10-325 MG PO TABS
1.0000 | ORAL_TABLET | Freq: Once | ORAL | Status: AC
  Administered 2011-07-02: 1 via ORAL
  Filled 2011-07-02: qty 1

## 2011-07-02 MED ORDER — HYDROCODONE-ACETAMINOPHEN 10-650 MG PO TABS: 1.0000 | ORAL_TABLET | Freq: Four times a day (QID) | ORAL | Status: AC | PRN

## 2011-07-02 NOTE — ED Provider Notes (Signed)
History     CSN: [id number]  Arrival date & time 07/02/11  1721   First MD Initiated Contact with Patient 07/02/11 1922      Chief Complaint  Patient presents with  . Back Pain    (Consider location/radiation/quality/duration/timing/severity/associated sxs/prior treatment) Patient is a 46 y.o. female presenting with back pain. The history is provided by the patient.  Back Pain  This is a chronic problem. The current episode started more than 1 week ago. The problem occurs constantly. The problem has been gradually worsening. Associated symptoms include numbness. Pertinent negatives include no dysuria and no weakness.  Pt states she has herniated disk in her back. Chronic back pain. No recent injuries. Pt states she ran out of her medications for pain last week, unable to get in with Dr. Mayford Knife who treats her. States she just needs few pills to get her until next week. Pain lower back, radiates down left leg. No fever, chills, abd pain, weakness or numbness in legs, urinary or fecal incontinence.   Past Medical History  Diagnosis Date  . Bipolar 1 disorder   . Schizophrenia   . Anxiety   . Fibromyalgia   . GERD (gastroesophageal reflux disease)   . Hypertension     Past Surgical History  Procedure Date  . Back surgery     History reviewed. No pertinent family history.  History  Substance Use Topics  . Smoking status: Current Everyday Smoker    Types: Cigarettes  . Smokeless tobacco: Not on file  . Alcohol Use: No    OB History    Grav Para Term Preterm Abortions TAB SAB Ect Mult Living                  Review of Systems  Respiratory: Negative.   Cardiovascular: Negative.   Genitourinary: Negative for dysuria and flank pain.  Musculoskeletal: Positive for back pain.  Skin: Negative.   Neurological: Positive for numbness. Negative for weakness.    Allergies  Prednisone  Home Medications   Current Outpatient Rx  Name Route Sig Dispense Refill  .  CITALOPRAM HYDROBROMIDE 40 MG PO TABS Oral Take 40 mg by mouth daily.    Marland Kitchen CLONAZEPAM 1 MG PO TABS Oral Take 1 mg by mouth 2 (two) times daily as needed. For anxiety.    Marland Kitchen GABAPENTIN 300 MG PO CAPS Oral Take 1 capsule (300 mg total) by mouth 2 times daily at 12 noon and 4 pm. 14 capsule 0  . HYDROCHLOROTHIAZIDE 25 MG PO TABS Oral Take 25 mg by mouth daily.    Marland Kitchen LAMOTRIGINE 200 MG PO TABS Oral Take 200 mg by mouth daily.    . QUETIAPINE FUMARATE 200 MG PO TABS Oral Take 400 mg by mouth at bedtime.    Marland Kitchen RANITIDINE HCL 150 MG PO TABS Oral Take 150 mg by mouth 2 (two) times daily.      BP 120/74  Pulse 76  Temp(Src) 98.6 F (37 C) (Oral)  Resp 18  SpO2 100%  Physical Exam  Nursing note and vitals reviewed. Constitutional: She is oriented to person, place, and time. She appears well-developed and well-nourished. No distress.  HENT:  Head: Normocephalic.  Eyes: Conjunctivae are normal.  Neck: Neck supple.  Cardiovascular: Normal rate, regular rhythm and normal heart sounds.   Pulmonary/Chest: Effort normal and breath sounds normal. No respiratory distress.  Abdominal: Soft. Bowel sounds are normal. She exhibits no distension. There is no tenderness.  Musculoskeletal:  Normal appearing lumbar spine. No midline tenderness. Tender in the left SI join, extending into left buttocks. Pain with left straight leg raise. No pain with Hip flexion in flexed knee position, no pain with hip internal or external rotation. Normal gait.   Neurological: She is alert and oriented to person, place, and time.       2+ and equal patellar reflexes bilaterally. Normal sensation and equal in bilat thighs. Normal strength, 5/5 in lower extremities.   Skin: Skin is warm and dry.    ED Course  Procedures (including critical care time)  Pt with chronic back pain. Ran out of her medications. Usually prescribed by Dr. Caroline Sauger. States last filled was beginning of April. I checked on database, pt is being  truthful, with last refill on 05/21/11, usually gets Lorcet 10-650 every month. Will giver her pain meds this time. Instructed to follow up with Dr. Mayford Knife next week for refills. Pt has no neurodefitis or red flags suggesting cauda equina or cord compression.   1. Back pain, chronic       MDM          Lottie Mussel, PA 07/03/11 1341

## 2011-07-02 NOTE — Discharge Instructions (Signed)
Continue your medications, including lorcet as prescribed. Follow up with your doctor as soon as able.   Back Pain, Adult Low back pain is very common. About 1 in 5 people have back pain.The cause of low back pain is rarely dangerous. The pain often gets better over time.About half of people with a sudden onset of back pain feel better in just 2 weeks. About 8 in 10 people feel better by 6 weeks.  CAUSES Some common causes of back pain include:  Strain of the muscles or ligaments supporting the spine.   Wear and tear (degeneration) of the spinal discs.   Arthritis.   Direct injury to the back.  DIAGNOSIS Most of the time, the direct cause of low back pain is not known.However, back pain can be treated effectively even when the exact cause of the pain is unknown.Answering your caregiver's questions about your overall health and symptoms is one of the most accurate ways to make sure the cause of your pain is not dangerous. If your caregiver needs more information, he or she may order lab work or imaging tests (X-rays or MRIs).However, even if imaging tests show changes in your back, this usually does not require surgery. HOME CARE INSTRUCTIONS For many people, back pain returns.Since low back pain is rarely dangerous, it is often a condition that people can learn to [facility] their own.   Remain active. It is stressful on the back to sit or stand in one place. Do not sit, drive, or stand in one place for more than 30 minutes at a time. Take short walks on level surfaces as soon as pain allows.Try to increase the length of time you walk each day.   Do not stay in bed.Resting more than 1 or 2 days can delay your recovery.   Do not avoid exercise or work.Your body is made to move.It is not dangerous to be active, even though your back may hurt.Your back will likely heal faster if you return to being active before your pain is gone.   Pay attention to your body when you bend and lift.  Many people have less discomfortwhen lifting if they bend their knees, keep the load close to their bodies,and avoid twisting. Often, the most comfortable positions are those that put less stress on your recovering back.   Find a comfortable position to sleep. Use a firm mattress and lie on your side with your knees slightly bent. If you lie on your back, put a pillow under your knees.   Only take over-the-counter or prescription medicines as directed by your caregiver. Over-the-counter medicines to reduce pain and inflammation are often the most helpful.Your caregiver may prescribe muscle relaxant drugs.These medicines help dull your pain so you can more quickly return to your normal activities and healthy exercise.   Put ice on the injured area.   Put ice in a plastic bag.   Place a towel between your skin and the bag.   Leave the ice on for 15 to 20 minutes, 3 to 4 times a day for the first 2 to 3 days. After that, ice and heat may be alternated to reduce pain and spasms.   Ask your caregiver about trying back exercises and gentle massage. This may be of some benefit.   Avoid feeling anxious or stressed.Stress increases muscle tension and can worsen back pain.It is important to recognize when you are anxious or stressed and learn ways to manage it.Exercise is a great option.  SEEK MEDICAL  CARE IF:  You have pain that is not relieved with rest or medicine.   You have pain that does not improve in 1 week.   You have new symptoms.   You are generally not feeling well.  SEEK IMMEDIATE MEDICAL CARE IF:   You have pain that radiates from your back into your legs.   You develop new bowel or bladder control problems.   You have unusual weakness or numbness in your arms or legs.   You develop nausea or vomiting.   You develop abdominal pain.   You feel faint.  Document Released: 02/01/2005 Document Revised: 01/21/2011 Document Reviewed: 06/22/2010 [facility] Patient  Information 2012 [place], [place].

## 2011-07-02 NOTE — ED Notes (Signed)
Patient has a known herniated disk and is here for pain management. Has numbness intermittently. The patient states that she has has the number in her feet and arms  For multiple years when she has pain. Denies any incontinence

## 2011-07-05 NOTE — ED Provider Notes (Signed)
Medical screening examination/treatment/procedure(s) were performed by non-physician practitioner and as supervising physician I was immediately available for consultation/collaboration.   Otilia Kareem E Cael Worth, MD 07/05/11 1753 

## 2011-07-31 ENCOUNTER — Encounter (HOSPITAL_COMMUNITY): Payer: Self-pay

## 2011-07-31 ENCOUNTER — Emergency Department (HOSPITAL_COMMUNITY)
Admission: EM | Admit: 2011-07-31 | Discharge: 2011-07-31 | Disposition: A | Discharge status: Home / Self Care | Payer: Medicare Other | Attending: Emergency Medicine | Admitting: Emergency Medicine

## 2011-07-31 DIAGNOSIS — I10 Essential (primary) hypertension: Secondary | ICD-10-CM | POA: Insufficient documentation

## 2011-07-31 DIAGNOSIS — K219 Gastro-esophageal reflux disease without esophagitis: Secondary | ICD-10-CM | POA: Insufficient documentation

## 2011-07-31 DIAGNOSIS — M549 Dorsalgia, unspecified: Secondary | ICD-10-CM

## 2011-07-31 DIAGNOSIS — G8929 Other chronic pain: Secondary | ICD-10-CM | POA: Insufficient documentation

## 2011-07-31 DIAGNOSIS — F319 Bipolar disorder, unspecified: Secondary | ICD-10-CM | POA: Insufficient documentation

## 2011-07-31 DIAGNOSIS — F209 Schizophrenia, unspecified: Secondary | ICD-10-CM | POA: Insufficient documentation

## 2011-07-31 DIAGNOSIS — M545 Low back pain, unspecified: Secondary | ICD-10-CM | POA: Insufficient documentation

## 2011-07-31 DIAGNOSIS — F17200 Nicotine dependence, unspecified, uncomplicated: Secondary | ICD-10-CM | POA: Insufficient documentation

## 2011-07-31 MED ORDER — HYDROCODONE-ACETAMINOPHEN 10-325 MG PO TABS: 1.0000 | ORAL_TABLET | Freq: Four times a day (QID) | ORAL | Status: AC | PRN

## 2011-07-31 MED ORDER — KETOROLAC TROMETHAMINE 60 MG/2ML IM SOLN
60.0000 mg | Freq: Once | INTRAMUSCULAR | Status: AC
  Administered 2011-07-31: 60 mg via INTRAMUSCULAR
  Filled 2011-07-31: qty 2

## 2011-07-31 NOTE — ED Notes (Signed)
Patient is resting comfortably. Family with pt

## 2011-07-31 NOTE — ED Notes (Signed)
Patient is resting comfortably. 

## 2011-07-31 NOTE — ED Provider Notes (Signed)
3:41 PM patient changed her mind and wants a shot of toradol.  Will order  toradol 60mg  IM.    Remi Haggard, NP 07/31/11 1542
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
## 2011-07-31 NOTE — ED Provider Notes (Signed)
Medical screening examination/treatment/procedure(s) were performed by non-physician practitioner and as supervising physician I was immediately available for consultation/collaboration.    Arren Laminack, MD 07/31/11 1645 

## 2011-07-31 NOTE — Discharge Instructions (Signed)
Please read and follow all provided instructions.  Your diagnoses today include:  1. Chronic back pain     Tests performed today include:  Vital signs - see below for your results today  Medications prescribed:   Vicodin (hydrocodone/acetaminophen) - narcotic pain medication  You have been prescribed narcotic pain medication such as Vicodin or Percocet: DO NOT drive or perform any activities that require you to be awake and alert because this medicine can make you drowsy. BE VERY CAREFUL not to take multiple medicines containing Tylenol (also called acetaminophen). Doing so can lead to an overdose which can damage your liver and cause liver failure and possibly death.   Take any prescribed medications only as directed.  Home care instructions:   Follow any educational materials contained in this packet  Please rest, use ice or heat on your back for the next several days  Do not lift, push, pull anything more than 10 pounds for the next week  Follow-up instructions: Please follow-up with your primary care provider in the next 1 week for further evaluation of your symptoms. If you do not have a primary care doctor -- see below for referral information.   Return instructions:  SEEK IMMEDIATE MEDICAL ATTENTION IF YOU HAVE:  New numbness, tingling, weakness, or problem with the use of your arms or legs  Severe back pain not relieved with medications  Loss control of your bowels or bladder  Increasing pain in any areas of the body (such as chest or abdominal pain)  Shortness of breath, dizziness, or fainting.   Worsening nausea (feeling sick to your stomach), vomiting, fever, or sweats  Any other emergent concerns regarding your health   Additional Information:  Your vital signs today were: BP 123/84  Pulse 92  Temp 98.7 F (37.1 C) (Oral)  Resp 18  SpO2 100% If your blood pressure (BP) was elevated above 135/85 this visit, please have this repeated by your doctor  within one month. -------------- No Primary Care Doctor Call [facility]  [phone] Other agencies that provide inexpensive medical care    [facility]  [phone]    [facility]  [phone]    [facility]  [phone]    [facility]  [phone]    [facility]  [phone]    [facility]  [phone] -------------- RESOURCE GUIDE:  Dental Problems  Patients with Medicaid: [facility] [phone]                                            [phone] [address] Phone:  [phone]                                                   Phone:  [phone]  If unable to pay or uninsured, contact:  [facility] or [facility]. to become qualified for the adult dental clinic.  Chronic Pain Problems Contact [facility]  [phone] Patients need to be referred by their primary care doctor.  Insufficient Money for Medicine Contact [employer]:  call "211" or [facility] [phone].  Psychological Services [facility]  [phone] [facility]  [phone] [facility]   [phone] (emergency services [phone])  Substance Abuse Resources [facility]  [phone] [facility] [phone] [facility] [phone] Floydene Flock [phone] Residential & Outpatient Substance Abuse Program  [phone]  Abuse/Neglect [facility] Child Abuse Hotline [phone] [facility] Child Abuse Hotline [phone] (After Hours)  Emergency Shelter [facility] [phone]  [facility] [phone] [facility] [phone]  [facility] Resources  [facility]     [employer]                          [facility] Dept. [address] [place]                        [address]      [address]  Blondell Reveal Phone:  [phone]                                   Phone:  [phone]                 Phone:  [phone]  [facility] Health Phone:  [phone]  [facility] Child Abuse Hotline ([phone] 793-9321 [phone] (After Hours)

## 2011-07-31 NOTE — ED Notes (Signed)
Pt in from home with lower back pain states hx of chronic back pain states pain increased when bending over denies other injuries

## 2011-08-01 NOTE — ED Provider Notes (Signed)
Medical screening examination/treatment/procedure(s) were performed by non-physician practitioner and as supervising physician I was immediately available for consultation/collaboration.   Ricard Faulkner E Salah Burlison, MD 08/01/11 0756 

## 2011-09-03 ENCOUNTER — Encounter (HOSPITAL_COMMUNITY): Payer: Self-pay | Admitting: *Deleted

## 2011-09-03 ENCOUNTER — Emergency Department (HOSPITAL_COMMUNITY)
Admission: EM | Admit: 2011-09-03 | Discharge: 2011-09-03 | Disposition: A | Discharge status: Home / Self Care | Payer: Medicare Other | Attending: Emergency Medicine | Admitting: Emergency Medicine

## 2011-09-03 DIAGNOSIS — R51 Headache: Secondary | ICD-10-CM | POA: Insufficient documentation

## 2011-09-03 DIAGNOSIS — K219 Gastro-esophageal reflux disease without esophagitis: Secondary | ICD-10-CM | POA: Insufficient documentation

## 2011-09-03 DIAGNOSIS — F319 Bipolar disorder, unspecified: Secondary | ICD-10-CM | POA: Insufficient documentation

## 2011-09-03 DIAGNOSIS — F17200 Nicotine dependence, unspecified, uncomplicated: Secondary | ICD-10-CM | POA: Insufficient documentation

## 2011-09-03 DIAGNOSIS — IMO0001 Reserved for inherently not codable concepts without codable children: Secondary | ICD-10-CM | POA: Insufficient documentation

## 2011-09-03 DIAGNOSIS — Z8659 Personal history of other mental and behavioral disorders: Secondary | ICD-10-CM | POA: Insufficient documentation

## 2011-09-03 DIAGNOSIS — F411 Generalized anxiety disorder: Secondary | ICD-10-CM | POA: Insufficient documentation

## 2011-09-03 DIAGNOSIS — Z79899 Other long term (current) drug therapy: Secondary | ICD-10-CM | POA: Insufficient documentation

## 2011-09-03 MED ORDER — DEXAMETHASONE SODIUM PHOSPHATE 10 MG/ML IJ SOLN
10.0000 mg | Freq: Once | INTRAMUSCULAR | Status: AC
  Administered 2011-09-03: 10 mg via INTRAVENOUS
  Filled 2011-09-03: qty 1

## 2011-09-03 MED ORDER — KETOROLAC TROMETHAMINE 30 MG/ML IJ SOLN
30.0000 mg | Freq: Once | INTRAMUSCULAR | Status: AC
  Administered 2011-09-03: 30 mg via INTRAVENOUS
  Filled 2011-09-03: qty 1

## 2011-09-03 MED ORDER — BUTALBITAL-APAP-CAFFEINE 50-325-40 MG PO TABS: 1.0000 | ORAL_TABLET | Freq: Four times a day (QID) | ORAL | Status: DC | PRN

## 2011-09-03 MED ORDER — DIPHENHYDRAMINE HCL 50 MG/ML IJ SOLN
25.0000 mg | Freq: Once | INTRAMUSCULAR | Status: AC
  Administered 2011-09-03: 25 mg via INTRAVENOUS
  Filled 2011-09-03: qty 1

## 2011-09-03 MED ORDER — METOCLOPRAMIDE HCL 5 MG/ML IJ SOLN
10.0000 mg | Freq: Once | INTRAMUSCULAR | Status: AC
Start: 2011-09-03 — End: 2011-09-03
  Administered 2011-09-03: 10 mg via INTRAVENOUS
  Filled 2011-09-03: qty 2

## 2011-09-03 MED ORDER — BUTORPHANOL TARTRATE 1 MG/ML IJ SOLN: 1.0000 mg | Freq: Once | INTRAMUSCULAR | Status: DC

## 2011-09-03 MED ORDER — NALBUPHINE HCL 10 MG/ML IJ SOLN: 10.0000 mg | INTRAMUSCULAR | Status: DC | PRN

## 2011-09-03 MED ORDER — SODIUM CHLORIDE 0.9 % IV BOLUS (SEPSIS)
1000.0000 mL | Freq: Once | INTRAVENOUS | Status: AC
  Administered 2011-09-03: 1000 mL via INTRAVENOUS

## 2011-09-03 MED ORDER — BUTORPHANOL TARTRATE 2 MG/ML IJ SOLN
INTRAMUSCULAR | Status: AC
  Administered 2011-09-03: 1 mg
  Filled 2011-09-03: qty 1

## 2011-09-03 NOTE — ED Notes (Signed)
Pt c/o "its worse than a migraine" started at the beginning of the month. Pt reports forehead pain, eye pain, nausea, photophobia and sound sensitivity. Pt reports taking goodys, tylenol and other pain medication with no relief.

## 2011-09-03 NOTE — ED Provider Notes (Signed)
History     CSN: [id number]  Arrival date & time 09/03/11  4098   First MD Initiated Contact with Patient 09/03/11 2028      Chief Complaint  Patient presents with  . Migraine   HPI  History provided by the patient. Patient is a 46 year old female history of anxiety, schizophrenia, bipolar disorder, chronic pain, and hypertension presents with complaints of headache. Symptoms began early this morning and have been persistent. Pain is located across bilateral for head and eye area. Patient reports having similar "cluster headaches" several times this month. Last headache began one week ago on Friday and ended on Saturday. Patient states she's been taking her daily lower set pain medications but this is not relieved headache symptoms. She did not want to take any extra dose of her medication but has been trying to use over-the-counter pain medications such as Excedrin, ibuprofen and Tylenol. Pain has been persistent and is worse with some movements, light and loud noise. Patient has prior history of severe cluster headaches in the past for which she has received Botox injections. She denies any fever, chills, sweats, confusion.    Past Medical History  Diagnosis Date  . Bipolar 1 disorder   . Schizophrenia   . Anxiety   . Fibromyalgia   . GERD (gastroesophageal reflux disease)   . Hypertension     History reviewed. No pertinent past surgical history.  History reviewed. No pertinent family history.  History  Substance Use Topics  . Smoking status: Current Everyday Smoker    Types: Cigarettes  . Smokeless tobacco: Not on file  . Alcohol Use: No    OB History    Grav Para Term Preterm Abortions TAB SAB Ect Mult Living                  Review of Systems  Constitutional: Negative for fever and chills.  Eyes: Positive for photophobia.  Gastrointestinal: Positive for nausea and vomiting. Negative for diarrhea and constipation.  Neurological: Positive for headaches. Negative  for dizziness, light-headedness and numbness.    Allergies  Prednisone  Home Medications   Current Outpatient Rx  Name Route Sig Dispense Refill  . TYLENOL ARTHRITIS PAIN PO Oral Take 2 tablets by mouth every 8 (eight) hours as needed. Low back pain    . CITALOPRAM HYDROBROMIDE 40 MG PO TABS Oral Take 40 mg by mouth daily.    Marland Kitchen CLONAZEPAM 1 MG PO TABS Oral Take 1 mg by mouth 2 (two) times daily as needed. For anxiety.    Marland Kitchen GABAPENTIN 300 MG PO CAPS Oral Take 1 capsule (300 mg total) by mouth 2 times daily at 12 noon and 4 pm. 14 capsule 0  . HYDROCHLOROTHIAZIDE 25 MG PO TABS Oral Take 25 mg by mouth daily.    Marland Kitchen HYDROCODONE-ACETAMINOPHEN 10-650 MG PO TABS Oral Take 1 tablet by mouth every 6 (six) hours as needed. Pain    . LAMOTRIGINE 200 MG PO TABS Oral Take 200 mg by mouth daily.    . QUETIAPINE FUMARATE 200 MG PO TABS Oral Take 400 mg by mouth at bedtime.      BP 114/73  Pulse 76  Temp 98.3 F (36.8 C) (Oral)  Resp 16  Wt 158 lb (71.668 kg)  SpO2 98%  Physical Exam  Nursing note and vitals reviewed. Constitutional: She is oriented to person, place, and time. She appears well-developed and well-nourished. No distress.  HENT:  Head: Normocephalic and atraumatic.  Eyes: Conjunctivae and EOM are  normal. Pupils are equal, round, and reactive to light.  Neck: Normal range of motion. Neck supple.       No meningeal signs  Cardiovascular: Normal rate and regular rhythm.   Pulmonary/Chest: Effort normal and breath sounds normal.  Abdominal: Soft.  Neurological: She is alert and oriented to person, place, and time. She has normal strength. No cranial nerve deficit or sensory deficit. Coordination and gait normal.  Skin: Skin is warm and dry.  Psychiatric: She has a normal mood and affect. Her behavior is normal.    ED Course  Procedures     1. Headache       MDM  8:35PM and evaluated. Patient with history of similar headaches in the past. Pain has been gradual. Normal  nonfocal neuro exam. No red flags for headache. We'll treat with migraine cocktail.   Patient reports having no relief after migraine cocktail. Patient requesting Nubain states this has worked for her in the past. He been ordered however pharmacy is currently out of Nubain. Will replace with Stadol instead.   Patient reports improvement of headache and ready to discharge at this time. She will followup with PCP for continued evaluation and treatment of symptoms.     Angus Seller, [place] 09/04/11 [phone]

## 2011-09-04 ENCOUNTER — Encounter (HOSPITAL_COMMUNITY): Payer: Self-pay | Admitting: *Deleted

## 2011-09-04 ENCOUNTER — Emergency Department (HOSPITAL_COMMUNITY)
Admission: EM | Admit: 2011-09-04 | Discharge: 2011-09-05 | Disposition: A | Discharge status: Home / Self Care | Payer: Medicare Other | Attending: Emergency Medicine | Admitting: Emergency Medicine

## 2011-09-04 DIAGNOSIS — R110 Nausea: Secondary | ICD-10-CM | POA: Insufficient documentation

## 2011-09-04 DIAGNOSIS — I10 Essential (primary) hypertension: Secondary | ICD-10-CM | POA: Insufficient documentation

## 2011-09-04 DIAGNOSIS — F319 Bipolar disorder, unspecified: Secondary | ICD-10-CM | POA: Insufficient documentation

## 2011-09-04 DIAGNOSIS — G44009 Cluster headache syndrome, unspecified, not intractable: Secondary | ICD-10-CM

## 2011-09-04 DIAGNOSIS — F209 Schizophrenia, unspecified: Secondary | ICD-10-CM | POA: Insufficient documentation

## 2011-09-04 DIAGNOSIS — F17200 Nicotine dependence, unspecified, uncomplicated: Secondary | ICD-10-CM | POA: Insufficient documentation

## 2011-09-04 DIAGNOSIS — Z79899 Other long term (current) drug therapy: Secondary | ICD-10-CM | POA: Insufficient documentation

## 2011-09-04 DIAGNOSIS — K219 Gastro-esophageal reflux disease without esophagitis: Secondary | ICD-10-CM | POA: Insufficient documentation

## 2011-09-04 DIAGNOSIS — R51 Headache: Secondary | ICD-10-CM | POA: Insufficient documentation

## 2011-09-04 MED ORDER — DEXAMETHASONE SODIUM PHOSPHATE 10 MG/ML IJ SOLN
10.0000 mg | Freq: Once | INTRAMUSCULAR | Status: AC
  Administered 2011-09-04: 10 mg via INTRAVENOUS
  Filled 2011-09-04: qty 1

## 2011-09-04 MED ORDER — KETOROLAC TROMETHAMINE 30 MG/ML IJ SOLN
30.0000 mg | Freq: Once | INTRAMUSCULAR | Status: AC
  Administered 2011-09-04: 30 mg via INTRAVENOUS
  Filled 2011-09-04: qty 1

## 2011-09-04 MED ORDER — SODIUM CHLORIDE 0.9 % IV BOLUS (SEPSIS)
1000.0000 mL | Freq: Once | INTRAVENOUS | Status: AC
  Administered 2011-09-04: 1000 mL via INTRAVENOUS

## 2011-09-04 MED ORDER — METOCLOPRAMIDE HCL 5 MG/ML IJ SOLN
10.0000 mg | Freq: Once | INTRAMUSCULAR | Status: AC
  Administered 2011-09-04: 10 mg via INTRAVENOUS
  Filled 2011-09-04: qty 2

## 2011-09-04 MED ORDER — PROCHLORPERAZINE EDISYLATE 5 MG/ML IJ SOLN
10.0000 mg | Freq: Four times a day (QID) | INTRAMUSCULAR | Status: DC | PRN
  Administered 2011-09-04: 10 mg via INTRAVENOUS
  Filled 2011-09-04: qty 2

## 2011-09-04 MED ORDER — DIPHENHYDRAMINE HCL 50 MG/ML IJ SOLN
25.0000 mg | Freq: Once | INTRAMUSCULAR | Status: AC
  Administered 2011-09-04: 25 mg via INTRAVENOUS
  Filled 2011-09-04: qty 1

## 2011-09-04 NOTE — ED Notes (Signed)
Patient not in room at this time.  Patient not in bathroom.

## 2011-09-04 NOTE — ED Notes (Signed)
Attempted to call patient at home number and mobile number.  Incorrect mobile number.  No answer at home number.  Patient has IV in at this time, unknown if it has been taken out by patient.

## 2011-09-04 NOTE — ED Notes (Signed)
Pt now states blurred vision and nausea, states that she has a cluster headache. No signs of distress noted at the time.

## 2011-09-04 NOTE — ED Notes (Signed)
[employer] notified of patient needing to come back to hospital.  [employer] enroute to find patient.

## 2011-09-04 NOTE — ED Provider Notes (Signed)
History     CSN: [id number]  Arrival date & time 09/04/11  1423   First MD Initiated Contact with Patient 09/04/11 1906      Chief Complaint  Patient presents with  . Headache  . Nausea    (Consider location/radiation/quality/duration/timing/severity/associated sxs/prior treatment) HPI Pt with history of multiple psychiatric disorders as well as chronic pain and cluster headaches reports she has had intermittent severe aching frontal headache for the last 3 weeks. Associated with nausea and photophobia. Seen at [facility] ED last night for same with no relief from meds given there (Decadron, Toradol and Stadol) although their note mentions she said she was improved. She has a long list of other symptoms which are chronic in nature such as cough, leg pain, abdominal pain, etc.   Past Medical History  Diagnosis Date  . Bipolar 1 disorder   . Schizophrenia   . Anxiety   . Fibromyalgia   . GERD (gastroesophageal reflux disease)   . Hypertension     History reviewed. No pertinent past surgical history.  History reviewed. No pertinent family history.  History  Substance Use Topics  . Smoking status: Current Everyday Smoker    Types: Cigarettes  . Smokeless tobacco: Not on file  . Alcohol Use: No    OB History    Grav Para Term Preterm Abortions TAB SAB Ect Mult Living                  Review of Systems All other systems reviewed and are negative except as noted in HPI.   Allergies  Prednisone and Sulfa antibiotics  Home Medications   Current Outpatient Rx  Name Route Sig Dispense Refill  . ALBUTEROL SULFATE HFA 108 (90 BASE) MCG/ACT IN AERS Inhalation Inhale 2 puffs into the lungs every 6 (six) hours as needed. For shortness of breath or wheezing    . GOODY HEADACHE PO Oral Take 1 packet by mouth daily.    Marland Kitchen BUTALBITAL-APAP-CAFFEINE 50-325-40 MG PO TABS Oral Take 1-2 tablets by mouth every 6 (six) hours as needed for headache. 10 tablet 0  . CITALOPRAM HYDROBROMIDE 40 MG  PO TABS Oral Take 40 mg by mouth daily.    Marland Kitchen CLONAZEPAM 1 MG PO TABS Oral Take 1 mg by mouth 2 (two) times daily as needed. For anxiety.    Marland Kitchen GABAPENTIN 300 MG PO CAPS Oral Take 1 capsule (300 mg total) by mouth 2 times daily at 12 noon and 4 pm. 14 capsule 0  . HYDROCHLOROTHIAZIDE 25 MG PO TABS Oral Take 25 mg by mouth daily.    Marland Kitchen HYDROCODONE-ACETAMINOPHEN 10-650 MG PO TABS Oral Take 1 tablet by mouth every 6 (six) hours as needed. For Pain    . LAMOTRIGINE 200 MG PO TABS Oral Take 200 mg by mouth daily.    . QUETIAPINE FUMARATE 200 MG PO TABS Oral Take 400 mg by mouth at bedtime.      BP 114/83  Pulse 59  Temp 97.9 F (36.6 C) (Oral)  Resp 20  SpO2 100%  Physical Exam  Nursing note and vitals reviewed. Constitutional: She is oriented to person, place, and time. She appears well-developed and well-nourished.  HENT:  Head: Normocephalic and atraumatic.  Eyes: EOM are normal. Pupils are equal, round, and reactive to light.  Neck: Normal range of motion. Neck supple.  Cardiovascular: Normal rate, normal heart sounds and intact distal pulses.   Pulmonary/Chest: Effort normal and breath sounds normal.  Abdominal: Bowel sounds are normal. She  exhibits no distension. There is no tenderness.  Musculoskeletal: Normal range of motion. She exhibits no edema and no tenderness.  Neurological: She is alert and oriented to person, place, and time. She has normal strength. No cranial nerve deficit or sensory deficit.  Skin: Skin is warm and dry. No rash noted.  Psychiatric: She has a normal mood and affect.    ED Course  Procedures (including critical care time)  Labs Reviewed - No data to display No results found.   No diagnosis found.    MDM  Will give IVF, Oxygen and Toradol/Reglan. Pt advised to see her PCP for other chronic complaints and we would limit her ED evaluation today to her headache which is subacute and similar to prior. Pt advised that we would not be giving her any  narcotic medications in the ED due to risk of rebound headache.   9:27 PM Pt's room is empty, IV bag is missing. Pt presumably has left AMA.       Charles B. Bernette Mayers, MD 09/04/11 2127

## 2011-09-04 NOTE — ED Notes (Signed)
Patient with nausea and vomiting and headache for the last 4 days.  Patient states that she is photophobic and noise sensitive.  Patient states that her head is pounding.

## 2011-09-04 NOTE — ED Notes (Signed)
Patient walked to bathroom.

## 2011-09-04 NOTE — ED Notes (Signed)
Pt reports having intermittent severe headaches since July 1st, started again today, having upset stomach due to nausea, reports sensitivity to light. Denies vomiting or diarrhea. No acute distress noted at triage.

## 2011-09-05 NOTE — ED Provider Notes (Signed)
Medical screening examination/treatment/procedure(s) were performed by non-physician practitioner and as supervising physician I was immediately available for consultation/collaboration.   Seynabou Fults, MD 09/05/11 0011 

## 2011-09-05 NOTE — ED Notes (Signed)
No information or answer to phone call at this time.  [employer] unable to find patient.

## 2011-09-08 ENCOUNTER — Emergency Department (HOSPITAL_COMMUNITY)
Admission: EM | Admit: 2011-09-08 | Discharge: 2011-09-08 | Disposition: A | Discharge status: Home / Self Care | Payer: Medicare Other | Attending: Emergency Medicine | Admitting: Emergency Medicine

## 2011-09-08 ENCOUNTER — Emergency Department (HOSPITAL_COMMUNITY): Payer: Medicare Other

## 2011-09-08 ENCOUNTER — Encounter (HOSPITAL_COMMUNITY): Payer: Self-pay | Admitting: *Deleted

## 2011-09-08 DIAGNOSIS — R51 Headache: Secondary | ICD-10-CM

## 2011-09-08 DIAGNOSIS — F17200 Nicotine dependence, unspecified, uncomplicated: Secondary | ICD-10-CM | POA: Insufficient documentation

## 2011-09-08 DIAGNOSIS — F411 Generalized anxiety disorder: Secondary | ICD-10-CM | POA: Insufficient documentation

## 2011-09-08 DIAGNOSIS — Z8659 Personal history of other mental and behavioral disorders: Secondary | ICD-10-CM | POA: Insufficient documentation

## 2011-09-08 DIAGNOSIS — K219 Gastro-esophageal reflux disease without esophagitis: Secondary | ICD-10-CM | POA: Insufficient documentation

## 2011-09-08 DIAGNOSIS — Z79899 Other long term (current) drug therapy: Secondary | ICD-10-CM | POA: Insufficient documentation

## 2011-09-08 DIAGNOSIS — IMO0001 Reserved for inherently not codable concepts without codable children: Secondary | ICD-10-CM | POA: Insufficient documentation

## 2011-09-08 DIAGNOSIS — I10 Essential (primary) hypertension: Secondary | ICD-10-CM | POA: Insufficient documentation

## 2011-09-08 DIAGNOSIS — F319 Bipolar disorder, unspecified: Secondary | ICD-10-CM | POA: Insufficient documentation

## 2011-09-08 MED ORDER — KETOROLAC TROMETHAMINE 60 MG/2ML IM SOLN
60.0000 mg | Freq: Once | INTRAMUSCULAR | Status: AC
  Administered 2011-09-08: 60 mg via INTRAMUSCULAR
  Filled 2011-09-08: qty 2

## 2011-09-08 MED ORDER — PROMETHAZINE HCL 25 MG/ML IJ SOLN
25.0000 mg | Freq: Once | INTRAMUSCULAR | Status: AC
  Administered 2011-09-08: 25 mg via INTRAMUSCULAR
  Filled 2011-09-08: qty 1

## 2011-09-08 NOTE — ED Notes (Signed)
Pt h/a x 2 days, reports she's had "cluster headache" 3 times this month and also has had blurred vision.  Pt reports seeing her PMD for it and was given dexilant for her h/a and states that as soon as the med wears off h/a recurrs.  Pt reports her PMD office is closed today.  Requesting head CT per her PMD.

## 2011-09-08 NOTE — ED Provider Notes (Signed)
History     CSN: [id number]  Arrival date & time 09/08/11  1144   First MD Initiated Contact with Patient 09/08/11 1323      Chief Complaint  Patient presents with  . Headache    (Consider location/radiation/quality/duration/timing/severity/associated sxs/prior treatment) HPI  46 year old female with hx of fibromyalgia, schizophrenia, and anxiety presents c/o headache. Sts she has been having recurrent headache for the past month.  Describe headache as band-like sensation across head and behind both eyes.  Described as throbbing, constant with light/sound sensitivity, nausea, and blurred vision.  Sts she was evaluated by her PCP and was diagnosed with "cluster headache".  Was given Dexilant but sts medication did not help.  Denies fever, neck stiffness, rash, or abd pain.  Has been to ER for same 4 days ago but left AMA.  Has prior hx of stroke.    Past Medical History  Diagnosis Date  . Bipolar 1 disorder   . Schizophrenia   . Anxiety   . Fibromyalgia   . GERD (gastroesophageal reflux disease)   . Hypertension     History reviewed. No pertinent past surgical history.  No family history on file.  History  Substance Use Topics  . Smoking status: Current Everyday Smoker    Types: Cigarettes  . Smokeless tobacco: Not on file  . Alcohol Use: No    OB History    Grav Para Term Preterm Abortions TAB SAB Ect Mult Living                  Review of Systems  All other systems reviewed and are negative.    Allergies  Prednisone and Sulfa antibiotics  Home Medications   Current Outpatient Rx  Name Route Sig Dispense Refill  . GOODY HEADACHE PO Oral Take 1 packet by mouth daily.    Marland Kitchen BUTALBITAL-APAP-CAFFEINE 50-325-40 MG PO TABS Oral Take 1-2 tablets by mouth every 6 (six) hours as needed. Migraine headache    . CITALOPRAM HYDROBROMIDE 40 MG PO TABS Oral Take 40 mg by mouth daily.    Marland Kitchen CLONAZEPAM 1 MG PO TABS Oral Take 1 mg by mouth 2 (two) times daily as needed. For  anxiety.    Marland Kitchen GABAPENTIN 300 MG PO CAPS Oral Take 1 capsule (300 mg total) by mouth 2 times daily at 12 noon and 4 pm. 14 capsule 0  . HYDROCHLOROTHIAZIDE 25 MG PO TABS Oral Take 25 mg by mouth daily.    Marland Kitchen HYDROCODONE-ACETAMINOPHEN 10-650 MG PO TABS Oral Take 1 tablet by mouth 2 (two) times daily as needed. For Pain    . LAMOTRIGINE 200 MG PO TABS Oral Take 200 mg by mouth daily.    . QUETIAPINE FUMARATE 200 MG PO TABS Oral Take 400 mg by mouth at bedtime.      BP 113/74  Pulse 84  Temp 98.5 F (36.9 C) (Oral)  Resp 18  SpO2 98%  Physical Exam  Nursing note and vitals reviewed. Constitutional: She is oriented to person, place, and time. She appears well-developed and well-nourished. No distress.       Awake, alert, nontoxic appearance  HENT:  Head: Normocephalic and atraumatic.  Right Ear: External ear normal.  Left Ear: External ear normal.  Mouth/Throat: Oropharynx is clear and moist.  Eyes: Conjunctivae and EOM are normal. Pupils are equal, round, and reactive to light. Right eye exhibits no discharge. Left eye exhibits no discharge.  Neck: Normal range of motion. Neck supple. No rigidity. No tracheal  deviation present. No Brudzinski's sign and no Kernig's sign noted.  Cardiovascular: Normal rate and regular rhythm.   Pulmonary/Chest: Effort normal. No respiratory distress. She exhibits no tenderness.  Abdominal: Soft. There is no tenderness. There is no rebound.  Musculoskeletal: She exhibits no tenderness.       ROM appears intact, no obvious focal weakness  Lymphadenopathy:    She has no cervical adenopathy.  Neurological: She is alert and oriented to person, place, and time. She has normal strength. No cranial nerve deficit or sensory deficit. She displays a negative Romberg sign. Coordination and gait normal. GCS eye subscore is 4. GCS verbal subscore is 5. GCS motor subscore is 6.       Mental status and motor strength appears intact  Skin: No rash noted.  Psychiatric:  She has a normal mood and affect.    ED Course  Procedures (including critical care time)  Labs Reviewed - No data to display No results found.   No diagnosis found.  Ct Head Wo Contrast  09/08/2011  *RADIOLOGY REPORT*  Clinical Data: Headache for 1 month.  Blurred vision.  History of fibromyalgia, schizophrenia, and bipolar illness.  CT HEAD WITHOUT CONTRAST  Technique:  Contiguous axial images were obtained from the base of the skull through the vertex without contrast.  Comparison: MRI brain 12/27/2003  Findings: There is no evidence for acute infarction, intracranial hemorrhage, mass lesion, hydrocephalus, or extra-axial fluid. There is no atrophy or white matter disease.  There is a chronic left thalamic lacunar infarct. The calvarium is intact. Sinuses and mastoids.  IMPRESSION: Remote left thalamic lacunar infarct.  Otherwise negative.  No acute intracranial abnormality.  Original Report Authenticated By: Elsie Stain, M.D.   1. Headache.    MDM  Headache x 1 month.  Not in any acute distress.  Afebrile, VSS, no focal neuro deficits, no meningismal sign.  Pt does endorse that she normally does not have headache until this past month.  Pt does request for head CT scan, and sts her PCP plan to obtain head CT for further care.  Therefore, head CT was ordered here today.    Pt receiving toradol and phenergan IM medication.    3:07 PM Head ct unremarkable.  Pain has improved with medication.  I recommend pt to f/u with her doctor, Dr. Mayford Knife for further management of her condition.  Pt stable to be discharge.          Fayrene Helper, PA-C 09/08/11 [phone]

## 2011-09-08 NOTE — ED Notes (Signed)
Pt adamant that toradol will not work for her HA. Requests that RN "hunt down that doctor and tell him that if he isn't willing to give me what I've already told him works, then I want him to just d/c me and I'll go see MY Dr tomorrow and get what I need." Pt informed that RN will pass along her concerns.

## 2011-09-08 NOTE — ED Notes (Signed)
RN in to room multiple times to d/c pt. Pt not in room or dept. Unable to locate pt. Will d/c without signature. No Rxs.

## 2011-09-08 NOTE — ED Provider Notes (Signed)
Medical screening examination/treatment/procedure(s) were performed by non-physician practitioner and as supervising physician I was immediately available for consultation/collaboration.   Chrishaun Sasso A Konstantina Nachreiner, MD 09/08/11 1517 

## 2011-09-10 ENCOUNTER — Emergency Department (HOSPITAL_COMMUNITY)
Admission: EM | Admit: 2011-09-10 | Discharge: 2011-09-10 | Disposition: A | Discharge status: Home / Self Care | Payer: Medicare Other | Attending: Emergency Medicine | Admitting: Emergency Medicine

## 2011-09-10 ENCOUNTER — Encounter (HOSPITAL_COMMUNITY): Payer: Self-pay | Admitting: *Deleted

## 2011-09-10 ENCOUNTER — Emergency Department (HOSPITAL_COMMUNITY): Payer: Medicare Other

## 2011-09-10 DIAGNOSIS — F411 Generalized anxiety disorder: Secondary | ICD-10-CM | POA: Insufficient documentation

## 2011-09-10 DIAGNOSIS — K219 Gastro-esophageal reflux disease without esophagitis: Secondary | ICD-10-CM | POA: Insufficient documentation

## 2011-09-10 DIAGNOSIS — IMO0001 Reserved for inherently not codable concepts without codable children: Secondary | ICD-10-CM | POA: Insufficient documentation

## 2011-09-10 DIAGNOSIS — Z79899 Other long term (current) drug therapy: Secondary | ICD-10-CM | POA: Insufficient documentation

## 2011-09-10 DIAGNOSIS — Z8659 Personal history of other mental and behavioral disorders: Secondary | ICD-10-CM | POA: Insufficient documentation

## 2011-09-10 DIAGNOSIS — I10 Essential (primary) hypertension: Secondary | ICD-10-CM | POA: Insufficient documentation

## 2011-09-10 DIAGNOSIS — R51 Headache: Secondary | ICD-10-CM | POA: Insufficient documentation

## 2011-09-10 DIAGNOSIS — F319 Bipolar disorder, unspecified: Secondary | ICD-10-CM | POA: Insufficient documentation

## 2011-09-10 DIAGNOSIS — F17200 Nicotine dependence, unspecified, uncomplicated: Secondary | ICD-10-CM | POA: Insufficient documentation

## 2011-09-10 MED ORDER — HYDROMORPHONE HCL PF 1 MG/ML IJ SOLN
1.0000 mg | Freq: Once | INTRAMUSCULAR | Status: AC
  Administered 2011-09-10: 1 mg via INTRAMUSCULAR
  Filled 2011-09-10: qty 1

## 2011-09-10 MED ORDER — HYDROCODONE-ACETAMINOPHEN 5-500 MG PO TABS: ORAL_TABLET | ORAL | Status: DC

## 2011-09-10 MED ORDER — ONDANSETRON 4 MG PO TBDP
4.0000 mg | ORAL_TABLET | Freq: Once | ORAL | Status: AC
Start: 2011-09-10 — End: 2011-09-10
  Administered 2011-09-10: 4 mg via ORAL
  Filled 2011-09-10: qty 1

## 2011-09-10 MED ORDER — HYDROCODONE-ACETAMINOPHEN 5-325 MG PO TABS
1.0000 | ORAL_TABLET | Freq: Once | ORAL | Status: AC
  Administered 2011-09-10: 1 via ORAL
  Filled 2011-09-10: qty 1

## 2011-09-10 NOTE — ED Provider Notes (Signed)
History     CSN: [id number]  Arrival date & time 09/10/11  1626   First MD Initiated Contact with Patient 09/10/11 1825      Chief Complaint  Patient presents with  . Headache    (Consider location/radiation/quality/duration/timing/severity/associated sxs/prior treatment) HPI Comments: States she has been having frontal and occipital area headaches for the past month but constant for the past week.  Saw her PCP who has tried her on a couple  Of meds with no improvement.  Denies trauma.    No h/o headaches.  L eye pain.  The history is provided by the patient. No language interpreter was used.    Past Medical History  Diagnosis Date  . Bipolar 1 disorder   . Schizophrenia   . Anxiety   . Fibromyalgia   . GERD (gastroesophageal reflux disease)   . Hypertension     History reviewed. No pertinent past surgical history.  No family history on file.  History  Substance Use Topics  . Smoking status: Current Everyday Smoker    Types: Cigarettes  . Smokeless tobacco: Not on file  . Alcohol Use: No    OB History    Grav Para Term Preterm Abortions TAB SAB Ect Mult Living                  Review of Systems  Constitutional: Negative for fever and chills.  Eyes: Positive for photophobia and pain. Negative for discharge, redness, itching and visual disturbance.  Genitourinary:       Post menopausal.  Neurological: Positive for headaches. Negative for dizziness.  All other systems reviewed and are negative.    Allergies  Prednisone; Nsaids; and Sulfa antibiotics  Home Medications   Current Outpatient Rx  Name Route Sig Dispense Refill  . GOODY HEADACHE PO Oral Take 1 packet by mouth every 6 (six) hours as needed. FOR PAIN    . BUTALBITAL-APAP-CAFFEINE 50-325-40 MG PO TABS Oral Take 1-2 tablets by mouth every 6 (six) hours as needed. Migraine headache    . CITALOPRAM HYDROBROMIDE 40 MG PO TABS Oral Take 40 mg by mouth daily.    Marland Kitchen CLONAZEPAM 1 MG PO TABS Oral Take 1  mg by mouth 2 (two) times daily as needed. For anxiety.    Marland Kitchen GABAPENTIN 300 MG PO CAPS Oral Take 1 capsule (300 mg total) by mouth 2 times daily at 12 noon and 4 pm. 14 capsule 0  . HYDROCHLOROTHIAZIDE 25 MG PO TABS Oral Take 25 mg by mouth daily.    Marland Kitchen HYDROCODONE-ACETAMINOPHEN 10-650 MG PO TABS Oral Take 1 tablet by mouth 2 (two) times daily as needed. For Pain    . LAMOTRIGINE 200 MG PO TABS Oral Take 200 mg by mouth daily.    . QUETIAPINE FUMARATE 200 MG PO TABS Oral Take 400 mg by mouth at bedtime.    Marland Kitchen HYDROCODONE-ACETAMINOPHEN 5-500 MG PO TABS  One tab po q 6 hrs prn pain 10 tablet 0    BP 97/71  Pulse 69  Temp 97.7 F (36.5 C) (Oral)  Resp 20  SpO2 100%  Physical Exam  Nursing note and vitals reviewed. Constitutional: She is oriented to person, place, and time. She appears well-developed and well-nourished. No distress.  HENT:  Head: Normocephalic and atraumatic.    Right Ear: External ear normal.  Left Ear: External ear normal.  Eyes: Conjunctivae and EOM are normal. Pupils are equal, round, and reactive to light. Right eye exhibits no discharge. Left eye exhibits  no discharge. No scleral icterus.  Neck: Normal range of motion.  Cardiovascular: Normal rate, regular rhythm and normal heart sounds.   Pulmonary/Chest: Effort normal and breath sounds normal.  Abdominal: Soft. She exhibits no distension. There is no tenderness.  Musculoskeletal: Normal range of motion.  Neurological: She is alert and oriented to person, place, and time. She has normal strength. No cranial nerve deficit or sensory deficit. She displays a negative Romberg sign. Coordination and gait normal. GCS eye subscore is 4. GCS verbal subscore is 5. GCS motor subscore is 6.  Reflex Scores:      Tricep reflexes are 2+ on the right side and 2+ on the left side.      Bicep reflexes are 2+ on the right side and 2+ on the left side.      Brachioradialis reflexes are 2+ on the right side and 2+ on the left side.       Patellar reflexes are 2+ on the right side and 2+ on the left side.      Achilles reflexes are 2+ on the right side and 2+ on the left side. Skin: Skin is warm and dry.  Psychiatric: She has a normal mood and affect. Judgment normal.    ED Course  Procedures (including critical care time)  Labs Reviewed - No data to display Ct Head Wo Contrast  09/10/2011  *RADIOLOGY REPORT*  Clinical Data: 46 year old female with headache.  CT HEAD WITHOUT CONTRAST  Technique:  Contiguous axial images were obtained from the base of the skull through the vertex without contrast.  Comparison: 09/08/2011  Findings: A remote left thalamic lacunar infarct is again noted.  No acute intracranial abnormalities are identified, including mass lesion or mass effect, hydrocephalus, extra-axial fluid collection, midline shift, hemorrhage, or acute infarction.  The visualized bony calvarium is unremarkable.  IMPRESSION: No evidence of acute intracranial abnormality.  Remote left thalamic lacunar infarct.  Original Report Authenticated By: Rosendo Gros, M.D.     1. Headache       MDM  [id number]-[id number]        Evalina Field, PA 09/10/11 1955

## 2011-09-10 NOTE — ED Notes (Signed)
Per EMS, pt from home, reports having chronic h/a x 1  Month, was seen here x 2 days.  No neuro deficits and stroke scale negative per EMS.  Pt ambulatory without difficulty.  Pt is also requesting to see a doctor not a PA.  Pt saw a PA from the last visit.

## 2011-09-10 NOTE — ED Notes (Signed)
Pt given bus pass ?

## 2011-09-10 NOTE — ED Notes (Signed)
Report received from [facility]  Pt is alert and oriented,  Pt refused to undress and put on gown,  Pt refused to see PA.  Pt given her medication of choice "Dilaudid"

## 2011-09-10 NOTE — ED Notes (Signed)
Pt reports new onset headaches starting the first of the month. Pt reports she was treated initially by her PCP outpatient, but medication has not improved pain. Pt reports constant week of headache with pain worse to left eye and changes in vision to left eye. Pt also reports photophobia and phonophobia.  Pt states pain goes across her forehead and around back of head, non-pulsatile and constant in nature.    Pt states Nubain helps for a short period of time.

## 2011-09-10 NOTE — ED Notes (Signed)
Pt states she has double up on her fioricet because of her increase in migraines pt states she is out her medication

## 2011-09-11 NOTE — ED Provider Notes (Signed)
Medical screening examination/treatment/procedure(s) were performed by non-physician practitioner and as supervising physician I was immediately available for consultation/collaboration.  Mikinzie Maciejewski, MD 09/11/11 1451 

## 2011-09-25 ENCOUNTER — Encounter (HOSPITAL_COMMUNITY): Payer: Self-pay | Admitting: Emergency Medicine

## 2011-09-25 ENCOUNTER — Emergency Department (HOSPITAL_COMMUNITY)
Admission: EM | Admit: 2011-09-25 | Discharge: 2011-09-26 | Disposition: A | Discharge status: Home / Self Care | Payer: Medicare Other | Attending: Emergency Medicine | Admitting: Emergency Medicine

## 2011-09-25 DIAGNOSIS — F319 Bipolar disorder, unspecified: Secondary | ICD-10-CM | POA: Insufficient documentation

## 2011-09-25 DIAGNOSIS — F411 Generalized anxiety disorder: Secondary | ICD-10-CM | POA: Insufficient documentation

## 2011-09-25 DIAGNOSIS — F17200 Nicotine dependence, unspecified, uncomplicated: Secondary | ICD-10-CM | POA: Insufficient documentation

## 2011-09-25 DIAGNOSIS — IMO0001 Reserved for inherently not codable concepts without codable children: Secondary | ICD-10-CM | POA: Insufficient documentation

## 2011-09-25 DIAGNOSIS — I10 Essential (primary) hypertension: Secondary | ICD-10-CM | POA: Insufficient documentation

## 2011-09-25 DIAGNOSIS — Z79899 Other long term (current) drug therapy: Secondary | ICD-10-CM | POA: Insufficient documentation

## 2011-09-25 DIAGNOSIS — G43909 Migraine, unspecified, not intractable, without status migrainosus: Secondary | ICD-10-CM | POA: Insufficient documentation

## 2011-09-25 DIAGNOSIS — Z8659 Personal history of other mental and behavioral disorders: Secondary | ICD-10-CM | POA: Insufficient documentation

## 2011-09-25 HISTORY — DX: Migraine, unspecified, not intractable, without status migrainosus: G43.909

## 2011-09-25 LAB — CBC WITH DIFFERENTIAL/PLATELET
Eosinophils Absolute: 0.1 10*3/uL (ref 0.0–0.7)
Eosinophils Relative: 3 % (ref 0–5)
HCT: 36.5 % (ref 36.0–46.0)
Hemoglobin: 12.3 g/dL (ref 12.0–15.0)
Lymphocytes Relative: 38 % (ref 12–46)
Lymphs Abs: 2 10*3/uL (ref 0.7–4.0)
MCH: 34.2 pg — ABNORMAL HIGH (ref 26.0–34.0)
MCV: 101.4 fL — ABNORMAL HIGH (ref 78.0–100.0)
Monocytes Absolute: 0.4 10*3/uL (ref 0.1–1.0)
Monocytes Relative: 8 % (ref 3–12)
Platelets: 171 10*3/uL (ref 150–400)
RBC: 3.6 MIL/uL — ABNORMAL LOW (ref 3.87–5.11)

## 2011-09-25 LAB — COMPREHENSIVE METABOLIC PANEL
BUN: 19 mg/dL (ref 6–23)
CO2: 29 mEq/L (ref 19–32)
Calcium: 8.5 mg/dL (ref 8.4–10.5)
Creatinine, Ser: 0.84 mg/dL (ref 0.50–1.10)
GFR calc Af Amer: >90 mL/min (ref >90)
GFR calc non Af Amer: 81 mL/min — ABNORMAL LOW (ref >90)
Glucose, Bld: 96 mg/dL (ref 70–99)
Total Protein: 6.8 g/dL (ref 6.0–8.3)

## 2011-09-25 LAB — LIPASE, BLOOD: Lipase: 45 U/L (ref 11–59)

## 2011-09-25 MED ORDER — DIPHENHYDRAMINE HCL 50 MG/ML IJ SOLN
50.0000 mg | Freq: Once | INTRAMUSCULAR | Status: AC
  Administered 2011-09-25: 50 mg via INTRAVENOUS
  Filled 2011-09-25: qty 1

## 2011-09-25 MED ORDER — METOCLOPRAMIDE HCL 5 MG/ML IJ SOLN
10.0000 mg | Freq: Once | INTRAMUSCULAR | Status: AC
  Administered 2011-09-25: 10 mg via INTRAVENOUS
  Filled 2011-09-25: qty 2

## 2011-09-25 MED ORDER — METOCLOPRAMIDE HCL 10 MG PO TABS: 10.0000 mg | ORAL_TABLET | Freq: Four times a day (QID) | ORAL | Status: DC

## 2011-09-25 MED ORDER — OXYCODONE-ACETAMINOPHEN 5-325 MG PO TABS: 2.0000 | ORAL_TABLET | ORAL | Status: DC | PRN

## 2011-09-25 MED ORDER — PROMETHAZINE HCL 25 MG/ML IJ SOLN
25.0000 mg | Freq: Once | INTRAMUSCULAR | Status: AC
  Administered 2011-09-25: 25 mg via INTRAVENOUS
  Filled 2011-09-25: qty 1

## 2011-09-25 MED ORDER — SODIUM CHLORIDE 0.9 % IV BOLUS (SEPSIS)
2000.0000 mL | Freq: Once | INTRAVENOUS | Status: AC
  Administered 2011-09-25: 2000 mL via INTRAVENOUS

## 2011-09-25 NOTE — ED Notes (Signed)
Pt states medicine given did not help her pain. Silverio Lay, MD made aware.

## 2011-09-25 NOTE — ED Notes (Signed)
Pt states she has migraine cluster headaches and has been dealing with this one off and on for 5 wks. States is on left side of head. PCP gives her a shot for her H/A and she has been to the office 6-7 for this H/A. States she has had 2 CTs. Nausea, photosensitivity, sound, and nausea is present as well.

## 2011-09-25 NOTE — ED Notes (Signed)
Pt request to be seen by MD, not a PA

## 2011-09-25 NOTE — ED Notes (Signed)
Pt encouraged to return to her room d/t unsteady gait, pt awaiting ride

## 2011-09-25 NOTE — ED Provider Notes (Signed)
History     CSN: [id number]  Arrival date & time 09/25/11  1816   First MD Initiated Contact with Patient 09/25/11 1934      Chief Complaint  Patient presents with  . Migraine    (Consider location/radiation/quality/duration/timing/severity/associated sxs/prior treatment) HPI Comments: [name] is a 46 y.o. Female history of migraines, asthma here presenting with severe headache. She's been having this headache for the last 5 weeks. The headache is on the left side it it's behind her left eye. It is associated with blurry vision and some mild photophobia and vomiting. She denies any neck pain, fever, or neck stiffness. She had a normal head CT done last week. She was seen in the ED a month ago and felt better after dilaudid. She is taking goody headache and neurontin and doesn't feel better. She also has mild epigastric pain with vomiting. No urinary symptoms.   The history is provided by the patient.    Past Medical History  Diagnosis Date  . Bipolar 1 disorder   . Schizophrenia   . Anxiety   . Fibromyalgia   . GERD (gastroesophageal reflux disease)   . Hypertension   . Migraine     Past Surgical History  Procedure Date  . Cesarean section     No family history on file.  History  Substance Use Topics  . Smoking status: Current Everyday Smoker -- 1.0 packs/day    Types: Cigarettes  . Smokeless tobacco: Never Used  . Alcohol Use: No    OB History    Grav Para Term Preterm Abortions TAB SAB Ect Mult Living                  Review of Systems  HENT:       Headache  Eyes: Positive for visual disturbance.  Gastrointestinal: Positive for vomiting.  Neurological: Positive for headaches.  All other systems reviewed and are negative.    Allergies  Prednisone; Nsaids; and Sulfa antibiotics  Home Medications   Current Outpatient Rx  Name Route Sig Dispense Refill  . ALBUTEROL SULFATE HFA 108 (90 BASE) MCG/ACT IN AERS Inhalation Inhale 2 puffs into the  lungs every 6 (six) hours as needed. For shortness of breath    . GOODY HEADACHE PO Oral Take 1 packet by mouth every 6 (six) hours as needed. FOR PAIN    . BISMUTH SUBSALICYLATE 262 MG/15ML PO SUSP Oral Take 15 mLs by mouth every 6 (six) hours as needed.    Marland Kitchen BUTALBITAL-APAP-CAFFEINE 50-325-40 MG PO TABS Oral Take 1-2 tablets by mouth every 6 (six) hours as needed. Migraine headache    . CITALOPRAM HYDROBROMIDE 40 MG PO TABS Oral Take 40 mg by mouth daily.    Marland Kitchen CLONAZEPAM 1 MG PO TABS Oral Take 1 mg by mouth 2 (two) times daily as needed. For anxiety.    Marland Kitchen GABAPENTIN 300 MG PO CAPS Oral Take 1 capsule (300 mg total) by mouth 2 times daily at 12 noon and 4 pm. 14 capsule 0  . HYDROCHLOROTHIAZIDE 25 MG PO TABS Oral Take 25 mg by mouth daily.    Marland Kitchen HYDROCODONE-ACETAMINOPHEN 10-650 MG PO TABS Oral Take 1 tablet by mouth 2 (two) times daily as needed. For Pain    . LAMOTRIGINE 200 MG PO TABS Oral Take 200 mg by mouth daily.    . QUETIAPINE FUMARATE 200 MG PO TABS Oral Take 400 mg by mouth at bedtime.    Marland Kitchen RANITIDINE HCL 150 MG PO TABS  Oral Take 150 mg by mouth 2 (two) times daily.      BP 106/71  Pulse 75  Temp 98.5 F (36.9 C) (Oral)  Resp 18  SpO2 100%  Physical Exam  Constitutional: She is oriented to person, place, and time.       Uncomfortable, vomiting.   HENT:  Head: Normocephalic.  Mouth/Throat: Oropharynx is clear and moist.  Eyes: Conjunctivae and EOM are normal. Pupils are equal, round, and reactive to light.       No photophobia.   Neck: Normal range of motion.       No meningeal signs  Cardiovascular: Normal rate, regular rhythm and normal heart sounds.   Pulmonary/Chest: Effort normal and breath sounds normal.  Abdominal: Soft. Bowel sounds are normal.  Musculoskeletal: Normal range of motion.       Nl strength and sensation.   Neurological: She is alert and oriented to person, place, and time.  Skin: Skin is warm.  Psychiatric: She has a normal mood and affect. Her  behavior is normal. Judgment and thought content normal.    ED Course  Procedures (including critical care time)  Labs Reviewed  CBC WITH DIFFERENTIAL - Abnormal; Notable for the following:    RBC 3.60 (*)     MCV 101.4 (*)     MCH 34.2 (*)     All other components within normal limits  COMPREHENSIVE METABOLIC PANEL - Abnormal; Notable for the following:    Total Bilirubin 0.1 (*)     GFR calc non Af Amer 81 (*)     All other components within normal limits  LIPASE, BLOOD   No results found.   No diagnosis found.    MDM  [name] is a 46 y.o. female hx of migraine here with another migraine. She has nl neuro exam and recent nl head CT. Not concerning for subarachnoid. Will treat her symptomatically.   11:01 PM Patient now feels well. Headache improved. Patient has hx of narcotic abuse. Will recommend continue the asa-tylenol-caffeine and will recommend reglan. Will only give several percocets for breakthrough pain.       Kristie Canal, MD 09/25/11 [phone]

## 2011-09-25 NOTE — ED Notes (Signed)
Pt noted to be ambulating in the hall.

## 2011-09-25 NOTE — ED Notes (Signed)
Pt states she has a migraine headache on her left side.  She is able to feel it into her left eye. Pt states that the pain is a constant aching, nagging pain. Sometimes it radiates to her forehead, neck and to her right side of her neck.  At times she will get a pinch on the top of her lower back, stating that it is extremely sharp.  Pt states she's had two cat scans in July. She has taken medicines with no relief.  States she is nauseous.

## 2011-09-26 ENCOUNTER — Emergency Department (HOSPITAL_COMMUNITY)
Admission: EM | Admit: 2011-09-26 | Discharge: 2011-09-26 | Discharge status: Against Medical Advice | Payer: Medicare Other | Attending: Emergency Medicine | Admitting: Emergency Medicine

## 2011-09-26 ENCOUNTER — Encounter (HOSPITAL_COMMUNITY): Payer: Self-pay | Admitting: Emergency Medicine

## 2011-09-26 DIAGNOSIS — IMO0001 Reserved for inherently not codable concepts without codable children: Secondary | ICD-10-CM | POA: Insufficient documentation

## 2011-09-26 DIAGNOSIS — F319 Bipolar disorder, unspecified: Secondary | ICD-10-CM | POA: Insufficient documentation

## 2011-09-26 DIAGNOSIS — R51 Headache: Secondary | ICD-10-CM

## 2011-09-26 DIAGNOSIS — R109 Unspecified abdominal pain: Secondary | ICD-10-CM | POA: Insufficient documentation

## 2011-09-26 DIAGNOSIS — F17200 Nicotine dependence, unspecified, uncomplicated: Secondary | ICD-10-CM | POA: Insufficient documentation

## 2011-09-26 DIAGNOSIS — F411 Generalized anxiety disorder: Secondary | ICD-10-CM | POA: Insufficient documentation

## 2011-09-26 DIAGNOSIS — K219 Gastro-esophageal reflux disease without esophagitis: Secondary | ICD-10-CM | POA: Insufficient documentation

## 2011-09-26 DIAGNOSIS — I10 Essential (primary) hypertension: Secondary | ICD-10-CM | POA: Insufficient documentation

## 2011-09-26 DIAGNOSIS — F209 Schizophrenia, unspecified: Secondary | ICD-10-CM | POA: Insufficient documentation

## 2011-09-26 DIAGNOSIS — R079 Chest pain, unspecified: Secondary | ICD-10-CM | POA: Insufficient documentation

## 2011-09-26 MED ORDER — DROPERIDOL 2.5 MG/ML IJ SOLN: 1.2500 mg | Freq: Once | INTRAMUSCULAR | Status: DC

## 2011-09-26 NOTE — ED Notes (Signed)
Pt walked out of room with EKG stickers still on and left AMA. Informed pt it was against medical advice to leave. Pt walked out of ED refusing to sign AMA form.

## 2011-09-26 NOTE — ED Notes (Signed)
Gave pt warm blanket. 

## 2011-09-26 NOTE — ED Notes (Signed)
Pt called from ED room 25, pt states she was just seen here and did not receive treatment for migraine. Pt states she expects to get same tx here as she would by here PCP. Pt takes Fioricet for migraines. Pt was d/c after receiving medication for HA. Pt has slurred speech at this time. Pt rambling, stating she has not money to get medications and is going to return to MD this week.

## 2011-09-26 NOTE — ED Notes (Signed)
Pt alert, returns to ED, states "i wasn't given anything", "i am hurting", resp even unlabored, skin pwd, speech clear, ambulates to triage

## 2011-09-26 NOTE — ED Notes (Addendum)
MD at bedside. 

## 2011-09-26 NOTE — ED Notes (Signed)
MD left bedside

## 2011-09-26 NOTE — ED Notes (Signed)
Gave pt a sandwich

## 2011-09-26 NOTE — ED Notes (Signed)
Pt reports being discharged recently, but could not fill out prescriptions because she has no money and checked back in because of the pain. Pt c/o migraine cluster on left side. Pt reports new onset of abdominal pain in the middle that feels like a "pulsating" pain.

## 2011-09-26 NOTE — ED Provider Notes (Addendum)
History     CSN: [id number]  Arrival date & time 09/26/11  0102   First MD Initiated Contact with Patient 09/26/11 [phone]      Chief Complaint  Patient presents with  . Headache    (Consider location/radiation/quality/duration/timing/severity/associated sxs/prior treatment) Patient is a 46 y.o. female presenting with headaches. The history is provided by the patient.  Headache  This is a chronic problem. Associated symptoms include nausea and vomiting. Pertinent negatives include no shortness of breath.   patient returns a headache. She's had multiple visits for the same in the ER in the last month. She states she's also been frequently seen her primary care Dr. She states that her doctor we'll give her a shot of medicine this including a pain medicine and is always helps. He was seen in ER for same earlier today and had a migraine cocktail. She states it helped some but the headache is back. No numbness weakness. She's previously been worked up.  Past Medical History  Diagnosis Date  . Bipolar 1 disorder   . Schizophrenia   . Anxiety   . Fibromyalgia   . GERD (gastroesophageal reflux disease)   . Hypertension   . Migraine     Past Surgical History  Procedure Date  . Cesarean section     No family history on file.  History  Substance Use Topics  . Smoking status: Current Everyday Smoker -- 1.0 packs/day    Types: Cigarettes  . Smokeless tobacco: Never Used  . Alcohol Use: No    OB History    Grav Para Term Preterm Abortions TAB SAB Ect Mult Living                  Review of Systems  Constitutional: Negative for activity change and appetite change.  HENT: Negative for neck stiffness.   Eyes: Positive for photophobia. Negative for pain.  Respiratory: Negative for chest tightness and shortness of breath.   Cardiovascular: Negative for chest pain and leg swelling.  Gastrointestinal: Positive for nausea, vomiting and abdominal pain. Negative for diarrhea.    Genitourinary: Negative for flank pain.  Musculoskeletal: Negative for back pain.  Skin: Negative for rash.  Neurological: Positive for headaches. Negative for weakness and numbness.  Psychiatric/Behavioral: Negative for behavioral problems.    Allergies  Prednisone; Nsaids; and Sulfa antibiotics  Home Medications   Current Outpatient Rx  Name Route Sig Dispense Refill  . ALBUTEROL SULFATE HFA 108 (90 BASE) MCG/ACT IN AERS Inhalation Inhale 2 puffs into the lungs every 6 (six) hours as needed. For shortness of breath    . GOODY HEADACHE PO Oral Take 1 packet by mouth every 6 (six) hours as needed. FOR PAIN    . BISMUTH SUBSALICYLATE 262 MG/15ML PO SUSP Oral Take 15 mLs by mouth every 6 (six) hours as needed. For upset stomach    . BUTALBITAL-APAP-CAFFEINE 50-325-40 MG PO TABS Oral Take 1-2 tablets by mouth every 6 (six) hours as needed. Migraine headache    . CITALOPRAM HYDROBROMIDE 40 MG PO TABS Oral Take 40 mg by mouth daily.    Marland Kitchen CLONAZEPAM 1 MG PO TABS Oral Take 1 mg by mouth 2 (two) times daily as needed. For anxiety.    Marland Kitchen GABAPENTIN 300 MG PO CAPS Oral Take 1 capsule (300 mg total) by mouth 2 times daily at 12 noon and 4 pm. 14 capsule 0  . HYDROCHLOROTHIAZIDE 25 MG PO TABS Oral Take 25 mg by mouth daily.    Marland Kitchen LAMOTRIGINE  200 MG PO TABS Oral Take 200 mg by mouth daily.    . QUETIAPINE FUMARATE 200 MG PO TABS Oral Take 400 mg by mouth at bedtime.    Marland Kitchen RANITIDINE HCL 150 MG PO TABS Oral Take 150 mg by mouth 2 (two) times daily.      BP 106/63  Pulse 57  Temp 98 F (36.7 C) (Oral)  Resp 20  SpO2 100%  Physical Exam  Nursing note and vitals reviewed. Constitutional: She is oriented to person, place, and time. She appears well-developed and well-nourished.  HENT:  Head: Normocephalic and atraumatic.  Eyes: EOM are normal. Pupils are equal, round, and reactive to light.  Neck: Normal range of motion. Neck supple.  Cardiovascular: Normal rate, regular rhythm and normal heart  sounds.   No murmur heard. Pulmonary/Chest: Effort normal and breath sounds normal. No respiratory distress. She has no wheezes. She has no rales.  Abdominal: Soft. Bowel sounds are normal. She exhibits no distension. There is no tenderness. There is no rebound and no guarding.  Musculoskeletal: Normal range of motion.  Neurological: She is alert and oriented to person, place, and time. No cranial nerve deficit.  Skin: Skin is warm and dry.  Psychiatric: She has a normal mood and affect. Her speech is normal.    ED Course  Procedures (including critical care time)  Labs Reviewed - No data to display No results found.   1. Headache       MDM  Patient presents with headache. Recently seen for the same. She has multiple visits this month for the same. She states the only thing that would work was pain medicines. She was informed she would not be getting any. She stated that there was nothing to do what she was going to leave. She became somewhat belligerent with staff and was escorted out. During the course of her visit she was offered droperidol, but the hospital is out of it. She refused Depakote because she states she can't take it. When asked why she cannot take it she just said there are lots of psych medicines she cannot take. She had been escorted out of the ER earlier by security after taking supplies. She came back after about an hour to be seen again.        Juliet Rude. Rubin Payor, MD 09/26/11 0804   Date: 11/26/2011  Rate: 59  Rhythm: sinus bradycardia  QRS Axis: normal  Intervals: normal  ST/T Wave abnormalities: nonspecific ST/T changes  Conduction Disutrbances:none  Narrative Interpretation:   Old EKG Reviewed: unchanged    Fatime Biswell R. Rubin Payor, MD 11/26/11 [phone]

## 2011-09-26 NOTE — ED Notes (Signed)
MD at bedside. 

## 2011-09-26 NOTE — ED Notes (Signed)
Went to bedside to discharge patient. Patient noted to have hospital gauze in her purse. Security called to bedside. Patient emptied purse and many 4 x 4s, 2 x 2s and tape were found that patient had taken from the drawer in the room. Supplies were retrieved and patient was escorted from property by security.

## 2011-09-26 NOTE — ED Notes (Addendum)
ED EKG placed on physician desk.

## 2011-10-19 ENCOUNTER — Emergency Department (HOSPITAL_COMMUNITY)
Admission: EM | Admit: 2011-10-19 | Discharge: 2011-10-20 | Disposition: A | Discharge status: Home / Self Care | Payer: Medicare Other | Attending: Emergency Medicine | Admitting: Emergency Medicine

## 2011-10-19 ENCOUNTER — Encounter (HOSPITAL_COMMUNITY): Payer: Self-pay | Admitting: Family Medicine

## 2011-10-19 DIAGNOSIS — F1210 Cannabis abuse, uncomplicated: Secondary | ICD-10-CM

## 2011-10-19 DIAGNOSIS — F319 Bipolar disorder, unspecified: Secondary | ICD-10-CM

## 2011-10-19 DIAGNOSIS — R51 Headache: Secondary | ICD-10-CM | POA: Insufficient documentation

## 2011-10-19 DIAGNOSIS — F10929 Alcohol use, unspecified with intoxication, unspecified: Secondary | ICD-10-CM

## 2011-10-19 DIAGNOSIS — I10 Essential (primary) hypertension: Secondary | ICD-10-CM | POA: Insufficient documentation

## 2011-10-19 DIAGNOSIS — F1010 Alcohol abuse, uncomplicated: Secondary | ICD-10-CM | POA: Insufficient documentation

## 2011-10-19 LAB — CBC
HCT: 39.6 % (ref 36.0–46.0)
Hemoglobin: 12.8 g/dL (ref 12.0–15.0)
MCH: 32.6 pg (ref 26.0–34.0)
MCHC: 32.3 g/dL (ref 30.0–36.0)
MCV: 100.8 fL — ABNORMAL HIGH (ref 78.0–100.0)
Platelets: 249 10*3/uL (ref 150–400)
RBC: 3.93 MIL/uL (ref 3.87–5.11)
RDW: 13.3 % (ref 11.5–15.5)
WBC: 7.4 10*3/uL (ref 4.0–10.5)

## 2011-10-19 LAB — COMPREHENSIVE METABOLIC PANEL
ALT: 11 U/L (ref 0–35)
AST: 20 U/L (ref 0–37)
Albumin: 4.1 g/dL (ref 3.5–5.2)
Alkaline Phosphatase: 83 U/L (ref 39–117)
BUN: 19 mg/dL (ref 6–23)
CO2: 25 mEq/L (ref 19–32)
Calcium: 9 mg/dL (ref 8.4–10.5)
Chloride: 104 mEq/L (ref 96–112)
Creatinine, Ser: 0.94 mg/dL (ref 0.50–1.10)
GFR calc Af Amer: 82 mL/min — ABNORMAL LOW (ref >90)
GFR calc non Af Amer: 71 mL/min — ABNORMAL LOW (ref >90)
Glucose, Bld: 91 mg/dL (ref 70–99)
Potassium: 4.3 mEq/L (ref 3.5–5.1)
Sodium: 141 mEq/L (ref 135–145)
Total Bilirubin: 0.2 mg/dL — ABNORMAL LOW (ref 0.3–1.2)
Total Protein: 7.3 g/dL (ref 6.0–8.3)

## 2011-10-19 LAB — RAPID URINE DRUG SCREEN, HOSP PERFORMED
Amphetamines: NOT DETECTED
Barbiturates: POSITIVE — AB
Benzodiazepines: NOT DETECTED
Cocaine: NOT DETECTED
Opiates: NOT DETECTED
Tetrahydrocannabinol: POSITIVE — AB

## 2011-10-19 LAB — URINALYSIS, ROUTINE W REFLEX MICROSCOPIC
Bilirubin Urine: NEGATIVE
Glucose, UA: NEGATIVE mg/dL
Hgb urine dipstick: NEGATIVE
Ketones, ur: NEGATIVE mg/dL
Nitrite: NEGATIVE
Protein, ur: NEGATIVE mg/dL
Specific Gravity, Urine: 1.013 (ref 1.005–1.030)
Urobilinogen, UA: 0.2 mg/dL (ref 0.0–1.0)
pH: 5 (ref 5.0–8.0)

## 2011-10-19 LAB — ETHANOL: Alcohol, Ethyl (B): 148 mg/dL — ABNORMAL HIGH (ref 0–11)

## 2011-10-19 MED ORDER — BUTALBITAL-APAP-CAFFEINE 50-325-40 MG PO TABS
1.0000 | ORAL_TABLET | Freq: Once | ORAL | Status: AC
  Administered 2011-10-19: 1 via ORAL

## 2011-10-19 MED ORDER — CLONAZEPAM 1 MG PO TABS
1.0000 mg | ORAL_TABLET | Freq: Two times a day (BID) | ORAL | Status: DC | PRN
  Administered 2011-10-20: 1 mg via ORAL
  Filled 2011-10-19: qty 1

## 2011-10-19 MED ORDER — ACETAMINOPHEN 325 MG PO TABS: 650.0000 mg | ORAL_TABLET | ORAL | Status: DC | PRN

## 2011-10-19 MED ORDER — OXYCODONE-ACETAMINOPHEN 5-325 MG PO TABS
1.0000 | ORAL_TABLET | Freq: Once | ORAL | Status: AC
  Administered 2011-10-20: 1 via ORAL
  Filled 2011-10-19: qty 1

## 2011-10-19 MED ORDER — CITALOPRAM HYDROBROMIDE 40 MG PO TABS
40.0000 mg | ORAL_TABLET | Freq: Every day | ORAL | Status: DC
  Administered 2011-10-20: 40 mg via ORAL
  Filled 2011-10-19: qty 1

## 2011-10-19 MED ORDER — NICOTINE 21 MG/24HR TD PT24
21.0000 mg | MEDICATED_PATCH | Freq: Every day | TRANSDERMAL | Status: DC
  Administered 2011-10-19 – 2011-10-20 (×2): 21 mg via TRANSDERMAL
  Filled 2011-10-19 (×2): qty 1

## 2011-10-19 MED ORDER — LAMOTRIGINE 200 MG PO TABS
200.0000 mg | ORAL_TABLET | Freq: Every day | ORAL | Status: DC
  Administered 2011-10-20: 200 mg via ORAL
  Filled 2011-10-19: qty 1

## 2011-10-19 MED ORDER — LORAZEPAM 1 MG PO TABS
1.0000 mg | ORAL_TABLET | Freq: Three times a day (TID) | ORAL | Status: DC | PRN
  Administered 2011-10-19 – 2011-10-20 (×2): 1 mg via ORAL
  Filled 2011-10-19 (×2): qty 1

## 2011-10-19 MED ORDER — NALBUPHINE HCL 10 MG/ML IJ SOLN: 10.0000 mg | INTRAMUSCULAR | Status: DC | PRN

## 2011-10-19 MED ORDER — QUETIAPINE FUMARATE 300 MG PO TABS
400.0000 mg | ORAL_TABLET | Freq: Every day | ORAL | Status: DC
  Administered 2011-10-20: 400 mg via ORAL
  Filled 2011-10-19: qty 1

## 2011-10-19 MED ORDER — GABAPENTIN 300 MG PO CAPS
300.0000 mg | ORAL_CAPSULE | Freq: Two times a day (BID) | ORAL | Status: DC
  Administered 2011-10-19 – 2011-10-20 (×3): 300 mg via ORAL
  Filled 2011-10-19 (×3): qty 1

## 2011-10-19 MED ORDER — BUTALBITAL-APAP-CAFFEINE 50-325-40 MG PO TABS
ORAL_TABLET | ORAL | Status: AC
  Filled 2011-10-19: qty 1

## 2011-10-19 NOTE — ED Notes (Signed)
Attempted to call report to psych ed. States RN will call back.
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
## 2011-10-19 NOTE — ED Notes (Signed)
Pt. Denies S/I, states that she would like to "hurt" her daughter and her boyfriend, "can't take the shit no more, feel best if they was in intensive care and wake the hell up". States that she said to them "you ain't gonna talk to me that way you mother F...."  Pt. Denies A/H, + for V/H, states that in her culture she has "visions".  Pt. Oriented to psych ed and states that she could really use something for her HA like the one that starts with a D.  (Pt. Confirmed she wants Dilaudid) for HA and chronic back pain)  EDP contacted and will return call.

## 2011-10-19 NOTE — ED Provider Notes (Signed)
Pt received onto behavioral unit.  Plan start home meds.  Will consult telepsych for recommendations.  Tobin Chad, MD 10/19/11 2025
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
## 2011-10-20 DIAGNOSIS — F1010 Alcohol abuse, uncomplicated: Secondary | ICD-10-CM

## 2011-10-20 DIAGNOSIS — F1210 Cannabis abuse, uncomplicated: Secondary | ICD-10-CM

## 2011-10-20 DIAGNOSIS — F3160 Bipolar disorder, current episode mixed, unspecified: Secondary | ICD-10-CM

## 2011-10-20 MED ORDER — BUTALBITAL-APAP-CAFFEINE 50-325-40 MG PO TABS: 2.0000 | ORAL_TABLET | Freq: Four times a day (QID) | ORAL | Status: DC | PRN

## 2011-10-20 MED ORDER — BUTALBITAL-APAP-CAFFEINE 50-325-40 MG PO TABS
1.0000 | ORAL_TABLET | Freq: Four times a day (QID) | ORAL | Status: DC | PRN
  Administered 2011-10-20: 1 via ORAL
  Filled 2011-10-20: qty 1

## 2011-10-20 MED ORDER — ALBUTEROL SULFATE HFA 108 (90 BASE) MCG/ACT IN AERS
1.0000 | INHALATION_SPRAY | Freq: Four times a day (QID) | RESPIRATORY_TRACT | Status: DC | PRN
Start: 2011-10-20 — End: 2011-10-21
  Administered 2011-10-20: 2 via RESPIRATORY_TRACT
  Filled 2011-10-20: qty 6.7

## 2011-10-20 MED ORDER — ONDANSETRON 4 MG PO TBDP
4.0000 mg | ORAL_TABLET | Freq: Three times a day (TID) | ORAL | Status: DC | PRN
  Administered 2011-10-20: 4 mg via ORAL
  Filled 2011-10-20: qty 1

## 2011-10-20 MED ORDER — HYDROCODONE-ACETAMINOPHEN 5-325 MG PO TABS
2.0000 | ORAL_TABLET | Freq: Four times a day (QID) | ORAL | Status: DC | PRN
  Administered 2011-10-20 (×2): 2 via ORAL
  Filled 2011-10-20 (×2): qty 2

## 2011-10-20 NOTE — Consult Note (Signed)
Reason for Consult: Bipolar disorder, most recent episode is mixed, alcohol intoxication, and cannabis abuse Referring Physician: Dr. Barbette Or [name] is an 46 y.o. female.  HPI: Patient was seen and chart reviewed. Patient was brought in by [facility] from her friend's home for psychiatric evaluation. Patient had an argument with her daughter and than she walked away from the home and went to the friend's home and called the [facility], talking with the counselor and telling them, she has been angry, has mood swings, and has conflict with her daughter and she's making homicidal threats towards her daughter. Patient counselor contacted the [facility] Department for a psychiatric evaluation. Patient has been diagnosed with bipolar disorder, anxiety disorder, fibromyalgia, hypertension, migraine headaches and back pain and has been receiving the prescription medication management from Dr. Chrissie Noa, and [address] medical clinic. Patient reportedly was seen a psychiatrist at [facility], but did not get along well and the stopped. She has plans of going back to the same facility to get psychiatric medication management from different psychiatrists and also planning to see a counselor on regular basis.   Patient denied current symptoms of for depression, anxiety, mania, anger outbursts, suicidal or homicidal ideation, intentions, or plans. Patient minimized her drug of for use by saying she drank only 2 beers and smoked one joint. Her urine drug screen was positive for cannabinoids and barbiturates. Her blood alcohol level is 148 on arrival. She has a previous psychiatric admission to the behavioral [facility] July 2010. Patient does not want to go back to home of her daughter, her boyfriend and their children, but has a plan of going to her friend's home who is supportive to her. Patient contract for safety and requested to be discharge to the  outpatient psychiatric services.   Patient was calm, cooperative, and has no abnormal psychomotor activity. Her stated mood is feeling better. Her affect was appropriate with her mood. She has normal rate, rhythm, and volume of speech and her thought processes linear and goal-directed. She has no suicidal, homicidal ideation or evidence of psychotic symptoms. Patient has fair insight, judgment and impulse control.  Past Medical History  Diagnosis Date  . Bipolar 1 disorder   . Schizophrenia   . Anxiety   . Fibromyalgia   . GERD (gastroesophageal reflux disease)   . Hypertension   . Migraine     Past Surgical History  Procedure Date  . Cesarean section     History reviewed. No pertinent family history.  Social History:  reports that she has been smoking Cigarettes.  She has been smoking about 1 pack per day. She has never used smokeless tobacco. She reports that she drinks alcohol. She reports that she does not use illicit drugs.  Allergies:  Allergies  Allergen Reactions  . Prednisone Shortness Of Breath and Swelling  . Nsaids Other (See Comments)    GERD  . Sulfa Antibiotics Swelling    Medications: I have reviewed the patient's current medications.  Results for orders placed during the hospital encounter of 10/19/11 (from the past 48 hour(s))  URINALYSIS, ROUTINE W REFLEX MICROSCOPIC     Status: Abnormal   Collection Time   10/19/11  3:49 PM      Component Value Range Comment   Color, Urine YELLOW  YELLOW    APPearance CLOUDY (*) CLEAR    Specific Gravity, Urine 1.013  1.005 - 1.030    pH 5.0  5.0 -  8.0    Glucose, UA NEGATIVE  NEGATIVE mg/dL    Hgb urine dipstick NEGATIVE  NEGATIVE    Bilirubin Urine NEGATIVE  NEGATIVE    Ketones, ur NEGATIVE  NEGATIVE mg/dL    Protein, ur NEGATIVE  NEGATIVE mg/dL    Urobilinogen, UA 0.2  0.0 - 1.0 mg/dL    Nitrite NEGATIVE  NEGATIVE    Leukocytes, UA SMALL (*) NEGATIVE   URINE RAPID DRUG SCREEN (HOSP PERFORMED)     Status:  Abnormal   Collection Time   10/19/11  3:49 PM      Component Value Range Comment   Opiates NONE DETECTED  NONE DETECTED    Cocaine NONE DETECTED  NONE DETECTED    Benzodiazepines NONE DETECTED  NONE DETECTED    Amphetamines NONE DETECTED  NONE DETECTED    Tetrahydrocannabinol POSITIVE (*) NONE DETECTED    Barbiturates POSITIVE (*) NONE DETECTED   URINE MICROSCOPIC-ADD ON     Status: Abnormal   Collection Time   10/19/11  3:49 PM      Component Value Range Comment   Squamous Epithelial / LPF FEW (*) RARE    WBC, UA 3-6  <3 WBC/hpf    Bacteria, UA FEW (*) RARE   CBC     Status: Abnormal   Collection Time   10/19/11  4:10 PM      Component Value Range Comment   WBC 7.4  4.0 - 10.5 K/uL    RBC 3.93  3.87 - 5.11 MIL/uL    Hemoglobin 12.8  12.0 - 15.0 g/dL    HCT 40.9  81.1 - 91.4 %    MCV 100.8 (*) 78.0 - 100.0 fL    MCH 32.6  26.0 - 34.0 pg    MCHC 32.3  30.0 - 36.0 g/dL    RDW 78.2  95.6 - 21.3 %    Platelets 249  150 - 400 K/uL   COMPREHENSIVE METABOLIC PANEL     Status: Abnormal   Collection Time   10/19/11  4:10 PM      Component Value Range Comment   Sodium 141  135 - 145 mEq/L    Potassium 4.3  3.5 - 5.1 mEq/L    Chloride 104  96 - 112 mEq/L    CO2 25  19 - 32 mEq/L    Glucose, Bld 91  70 - 99 mg/dL    BUN 19  6 - 23 mg/dL    Creatinine, Ser 0.86  0.50 - 1.10 mg/dL    Calcium 9.0  8.4 - 57.8 mg/dL    Total Protein 7.3  6.0 - 8.3 g/dL    Albumin 4.1  3.5 - 5.2 g/dL    AST 20  0 - 37 U/L    ALT 11  0 - 35 U/L    Alkaline Phosphatase 83  39 - 117 U/L    Total Bilirubin 0.2 (*) 0.3 - 1.2 mg/dL    GFR calc non Af Amer 71 (*) >90 mL/min    GFR calc Af Amer 82 (*) >90 mL/min   ETHANOL     Status: Abnormal   Collection Time   10/19/11  4:10 PM      Component Value Range Comment   Alcohol, Ethyl (B) 148 (*) 0 - 11 mg/dL     No results found.  No depression, No anxiety, No psychosis and Positive for anxiety, bad mood, bipolar, excessive alcohol consumption, illegal drug  usage, mood swings and interpersonal conflict with her  daughter(26) Blood pressure 106/66, pulse 66, temperature 98.2 F (36.8 C), temperature source Oral, resp. rate 18, SpO2 95.00%.   Assessment/Plan: Bipolar disorder, MRE is mixed Alcohol abuse vs dependence Cannabis abuse  Patient will be discharged to out [facility] for medication management and counseling. She will continue her home medications and no changes made during this visit. Patient home medications are Celexa 40 mg once daily, Neurontin 300 mg twice daily, Lamictal 200 mg daily, Seroquel 400 mg at bedtime.  Jozette Castrellon,[name] 10/20/2011, 5:01 PM

## 2011-10-20 NOTE — ED Provider Notes (Signed)
She was seen by the in-house psychiatrist, who recommended followup with out- patient treatment at the [facility]. Patient agrees to go home and is comfortable, but states that she wants some of the pain medicine that she got here, to use at home. She is referred to narcotics. I informed her that she needs to see her primary care Dr. for that type of treatment.  Flint Melter, MD 10/20/11 2146

## 2011-10-20 NOTE — ED Provider Notes (Signed)
She still has migraines. She is on lorcet and fioricet at home. She is requesting Norco. Will give norco prn for HA. Otherwise she is medically stable. Pending behavorial health placement.   Richardean Canal, MD 10/20/11 [phone]

## 2011-10-20 NOTE — ED Notes (Signed)
Discharged to waiting room.

## 2011-10-22 ENCOUNTER — Inpatient Hospital Stay (HOSPITAL_COMMUNITY)
Admission: EM | Admit: 2011-10-22 | Discharge: 2011-10-28 | DRG: 897 | Disposition: A | Discharge status: Home / Self Care | Payer: Medicare Other | Source: Ambulatory Visit | Attending: Psychiatry | Admitting: Psychiatry

## 2011-10-22 ENCOUNTER — Encounter (HOSPITAL_COMMUNITY): Payer: Self-pay | Admitting: Family Medicine

## 2011-10-22 ENCOUNTER — Emergency Department (HOSPITAL_COMMUNITY)
Admission: EM | Admit: 2011-10-22 | Discharge: 2011-10-22 | Disposition: A | Discharge status: Other Acute Inpatient Hospital | Payer: Medicare Other | Source: Home / Self Care | Attending: Emergency Medicine | Admitting: Emergency Medicine

## 2011-10-22 DIAGNOSIS — R45851 Suicidal ideations: Secondary | ICD-10-CM | POA: Insufficient documentation

## 2011-10-22 DIAGNOSIS — F1010 Alcohol abuse, uncomplicated: Secondary | ICD-10-CM

## 2011-10-22 DIAGNOSIS — R209 Unspecified disturbances of skin sensation: Secondary | ICD-10-CM | POA: Diagnosis present

## 2011-10-22 DIAGNOSIS — R51 Headache: Secondary | ICD-10-CM | POA: Diagnosis present

## 2011-10-22 DIAGNOSIS — F19939 Other psychoactive substance use, unspecified with withdrawal, unspecified: Principal | ICD-10-CM | POA: Diagnosis present

## 2011-10-22 DIAGNOSIS — I10 Essential (primary) hypertension: Secondary | ICD-10-CM | POA: Diagnosis present

## 2011-10-22 DIAGNOSIS — Z8669 Personal history of other diseases of the nervous system and sense organs: Secondary | ICD-10-CM

## 2011-10-22 DIAGNOSIS — M5442 Lumbago with sciatica, left side: Secondary | ICD-10-CM

## 2011-10-22 DIAGNOSIS — F209 Schizophrenia, unspecified: Secondary | ICD-10-CM | POA: Insufficient documentation

## 2011-10-22 DIAGNOSIS — K219 Gastro-esophageal reflux disease without esophagitis: Secondary | ICD-10-CM | POA: Diagnosis present

## 2011-10-22 DIAGNOSIS — F411 Generalized anxiety disorder: Secondary | ICD-10-CM | POA: Insufficient documentation

## 2011-10-22 DIAGNOSIS — G43109 Migraine with aura, not intractable, without status migrainosus: Secondary | ICD-10-CM

## 2011-10-22 DIAGNOSIS — F1320 Sedative, hypnotic or anxiolytic dependence, uncomplicated: Secondary | ICD-10-CM | POA: Diagnosis present

## 2011-10-22 DIAGNOSIS — Z79899 Other long term (current) drug therapy: Secondary | ICD-10-CM

## 2011-10-22 DIAGNOSIS — F319 Bipolar disorder, unspecified: Secondary | ICD-10-CM

## 2011-10-22 DIAGNOSIS — Z23 Encounter for immunization: Secondary | ICD-10-CM

## 2011-10-22 DIAGNOSIS — F1219 Cannabis abuse with unspecified cannabis-induced disorder: Secondary | ICD-10-CM

## 2011-10-22 DIAGNOSIS — IMO0001 Reserved for inherently not codable concepts without codable children: Secondary | ICD-10-CM | POA: Insufficient documentation

## 2011-10-22 DIAGNOSIS — M5430 Sciatica, unspecified side: Secondary | ICD-10-CM | POA: Diagnosis present

## 2011-10-22 LAB — COMPREHENSIVE METABOLIC PANEL
ALT: 16 U/L (ref 0–35)
AST: 22 U/L (ref 0–37)
Calcium: 9 mg/dL (ref 8.4–10.5)
Creatinine, Ser: 0.89 mg/dL (ref 0.50–1.10)
GFR calc Af Amer: 88 mL/min — ABNORMAL LOW (ref >90)
Glucose, Bld: 91 mg/dL (ref 70–99)
Sodium: 139 mEq/L (ref 135–145)
Total Protein: 7.2 g/dL (ref 6.0–8.3)

## 2011-10-22 LAB — CBC WITH DIFFERENTIAL/PLATELET
Basophils Absolute: 0 10*3/uL (ref 0.0–0.1)
Eosinophils Absolute: 0.1 10*3/uL (ref 0.0–0.7)
Eosinophils Relative: 2 % (ref 0–5)
MCH: 32.9 pg (ref 26.0–34.0)
MCV: 100.5 fL — ABNORMAL HIGH (ref 78.0–100.0)
Monocytes Absolute: 0.5 10*3/uL (ref 0.1–1.0)
Platelets: 198 10*3/uL (ref 150–400)
RDW: 13.2 % (ref 11.5–15.5)

## 2011-10-22 MED ORDER — ALBUTEROL SULFATE HFA 108 (90 BASE) MCG/ACT IN AERS
2.0000 | INHALATION_SPRAY | Freq: Four times a day (QID) | RESPIRATORY_TRACT | Status: DC | PRN
  Administered 2011-10-22: 2 via RESPIRATORY_TRACT
  Filled 2011-10-22: qty 6.7

## 2011-10-22 MED ORDER — LAMOTRIGINE 100 MG PO TABS
100.0000 mg | ORAL_TABLET | Freq: Every day | ORAL | Status: DC
  Filled 2011-10-22: qty 1

## 2011-10-22 MED ORDER — VERAPAMIL HCL 40 MG PO TABS
40.0000 mg | ORAL_TABLET | Freq: Every day | ORAL | Status: DC
  Administered 2011-10-22: 40 mg via ORAL
  Filled 2011-10-22: qty 1

## 2011-10-22 MED ORDER — KETOROLAC TROMETHAMINE 30 MG/ML IJ SOLN
60.0000 mg | Freq: Once | INTRAMUSCULAR | Status: AC
  Administered 2011-10-22: 60 mg via INTRAMUSCULAR
  Filled 2011-10-22: qty 2

## 2011-10-22 MED ORDER — HYDROCHLOROTHIAZIDE 25 MG PO TABS
25.0000 mg | ORAL_TABLET | Freq: Every day | ORAL | Status: DC
  Administered 2011-10-22: 25 mg via ORAL
  Filled 2011-10-22: qty 1

## 2011-10-22 MED ORDER — NICOTINE 21 MG/24HR TD PT24
21.0000 mg | MEDICATED_PATCH | Freq: Every day | TRANSDERMAL | Status: DC
  Administered 2011-10-22: 21 mg via TRANSDERMAL
  Filled 2011-10-22: qty 1

## 2011-10-22 MED ORDER — TRAMADOL HCL 50 MG PO TABS
50.0000 mg | ORAL_TABLET | Freq: Once | ORAL | Status: AC
  Administered 2011-10-22: 50 mg via ORAL
  Filled 2011-10-22: qty 1

## 2011-10-22 MED ORDER — SUMATRIPTAN SUCCINATE 100 MG PO TABS
100.0000 mg | ORAL_TABLET | ORAL | Status: DC | PRN
  Administered 2011-10-22: 100 mg via ORAL
  Filled 2011-10-22 (×2): qty 1

## 2011-10-22 MED ORDER — ZOLPIDEM TARTRATE 5 MG PO TABS
5.0000 mg | ORAL_TABLET | Freq: Every evening | ORAL | Status: DC | PRN
  Administered 2011-10-22: 5 mg via ORAL
  Filled 2011-10-22: qty 1

## 2011-10-22 MED ORDER — HYDROCODONE-ACETAMINOPHEN 5-325 MG PO TABS
1.0000 | ORAL_TABLET | Freq: Four times a day (QID) | ORAL | Status: DC | PRN
  Administered 2011-10-22: 2 via ORAL
  Filled 2011-10-22: qty 2

## 2011-10-22 MED ORDER — GABAPENTIN 300 MG PO CAPS
300.0000 mg | ORAL_CAPSULE | Freq: Two times a day (BID) | ORAL | Status: DC
  Administered 2011-10-22 (×2): 300 mg via ORAL
  Filled 2011-10-22 (×2): qty 1

## 2011-10-22 MED ORDER — CLONAZEPAM 0.5 MG PO TABS
1.0000 mg | ORAL_TABLET | Freq: Three times a day (TID) | ORAL | Status: DC | PRN
  Administered 2011-10-22 (×2): 1 mg via ORAL
  Filled 2011-10-22 (×2): qty 2

## 2011-10-22 MED ORDER — BUTALBITAL-APAP-CAFFEINE 50-325-40 MG PO TABS
1.0000 | ORAL_TABLET | Freq: Four times a day (QID) | ORAL | Status: DC | PRN
  Administered 2011-10-22: 1 via ORAL
  Filled 2011-10-22 (×2): qty 1

## 2011-10-22 MED ORDER — PROMETHAZINE HCL 25 MG RE SUPP
25.0000 mg | Freq: Four times a day (QID) | RECTAL | Status: DC | PRN
  Filled 2011-10-22: qty 1

## 2011-10-22 MED ORDER — HYDROCODONE-ACETAMINOPHEN 5-325 MG PO TABS
1.0000 | ORAL_TABLET | Freq: Four times a day (QID) | ORAL | Status: DC | PRN
  Administered 2011-10-22: 1 via ORAL
  Filled 2011-10-22: qty 1

## 2011-10-22 MED ORDER — PROMETHAZINE HCL 25 MG/ML IJ SOLN
25.0000 mg | Freq: Four times a day (QID) | INTRAMUSCULAR | Status: DC | PRN
  Administered 2011-10-22 (×2): 25 mg via INTRAMUSCULAR
  Filled 2011-10-22 (×2): qty 1

## 2011-10-22 MED ORDER — QUETIAPINE FUMARATE 200 MG PO TABS
400.0000 mg | ORAL_TABLET | Freq: Every day | ORAL | Status: DC
  Administered 2011-10-22: 400 mg via ORAL
  Filled 2011-10-22: qty 2

## 2011-10-22 MED ORDER — CITALOPRAM HYDROBROMIDE 10 MG PO TABS
40.0000 mg | ORAL_TABLET | Freq: Every day | ORAL | Status: DC
  Filled 2011-10-22: qty 3
  Filled 2011-10-22: qty 1

## 2011-10-22 NOTE — ED Notes (Addendum)
Pt states she has a hx of cluster headaches and bulging discs in her back and that she is in severe pain and requesting pain meds.

## 2011-10-22 NOTE — ED Provider Notes (Signed)
History   This chart was scribed for Gerhard Munch, MD by Melba Coon. The patient was seen in room TR04C/TR04C and the patient's care was started at 1:20PM.    CSN: [id number]  Arrival date & time 10/22/11  1144   First MD Initiated Contact with Patient 10/22/11 1228      Chief Complaint  Patient presents with  . Suicidal     HPI [name] is a 46 y.o. female who presents to the Emergency Department complaining of persistent suicidal ideation with an onset in the past week. Pt was at [facility] around onset and was admitted for 3 days. Pt states that she still has SI. Pt states that she wants to be with her family but can't. Pt also states that she "doesn't care if her heart stops". Pt states she has a psychiatrist and has been in psych facilities and taken classes. Pt states that she has been feeling depressed and want more help. Hx of suicidal attempts x10. Pt also c/o left-sided HA. Toradol and phenergan alleviate her HAs. No other complaints. Hx of migraines and asthma. No other pertinent medical symptoms.   Past Medical History  Diagnosis Date  . Bipolar 1 disorder   . Schizophrenia   . Anxiety   . Fibromyalgia   . GERD (gastroesophageal reflux disease)   . Hypertension   . Migraine     Past Surgical History  Procedure Date  . Cesarean section     No family history on file.  History  Substance Use Topics  . Smoking status: Current Everyday Smoker -- 1.0 packs/day    Types: Cigarettes  . Smokeless tobacco: Never Used  . Alcohol Use: Yes    OB History    Grav Para Term Preterm Abortions TAB SAB Ect Mult Living                  Review of Systems 10 Systems reviewed and all are negative for acute change except as noted in the HPI.   Allergies  Prednisone; Nsaids; and Sulfa antibiotics  Home Medications   Current Outpatient Rx  Name Route Sig Dispense Refill  . ALBUTEROL SULFATE HFA 108 (90 BASE) MCG/ACT IN AERS Inhalation Inhale 2 puffs  into the lungs every 6 (six) hours as needed. For shortness of breath    . GOODY HEADACHE PO Oral Take 1 packet by mouth every 6 (six) hours as needed. FOR PAIN    . BUTALBITAL-APAP-CAFFEINE 50-325-40 MG PO TABS Oral Take 1-2 tablets by mouth every 6 (six) hours as needed. Migraine headache    . CITALOPRAM HYDROBROMIDE 40 MG PO TABS Oral Take 40 mg by mouth daily.    Marland Kitchen CLONAZEPAM 1 MG PO TABS Oral Take 1 mg by mouth 3 (three) times daily as needed. For anxiety.    Marland Kitchen GABAPENTIN 300 MG PO CAPS Oral Take 300 mg by mouth 2 (two) times daily. At noon and 4pm.    . HYDROCHLOROTHIAZIDE 25 MG PO TABS Oral Take 25 mg by mouth daily.    Marland Kitchen HYDROCODONE-ACETAMINOPHEN 10-650 MG PO TABS Oral Take 1 tablet by mouth every 8 (eight) hours as needed. Pain.    Marland Kitchen LAMOTRIGINE 100 MG PO TABS Oral Take 100 mg by mouth daily.    Marland Kitchen PROMETHAZINE HCL 25 MG RE SUPP Rectal Place 25 mg rectally as needed. As directed for acute nausea.    Marland Kitchen QUETIAPINE FUMARATE 200 MG PO TABS Oral Take 400 mg by mouth at bedtime.    Marland Kitchen  SUMATRIPTAN SUCCINATE 100 MG PO TABS Oral Take 100 mg by mouth every 2 (two) hours as needed. Take 1 tablet by mouth as needed for migraine. May repeat once in 2 hours if needed.    Marland Kitchen VERAPAMIL HCL 40 MG PO TABS Oral Take 40 mg by mouth at bedtime.    Marland Kitchen ZOLPIDEM TARTRATE PO Oral Take by mouth.      BP 123/76  Pulse 90  Temp 98.7 F (37.1 C) (Oral)  Resp 16  SpO2 95%  Physical Exam  Nursing note and vitals reviewed. Constitutional: She is oriented to person, place, and time. She appears well-developed and well-nourished.  HENT:  Head: Normocephalic and atraumatic.  Eyes: EOM are normal. Pupils are equal, round, and reactive to light.  Neck: Normal range of motion. Neck supple.  Cardiovascular: Normal rate, normal heart sounds and intact distal pulses.   Pulmonary/Chest: Effort normal and breath sounds normal.  Abdominal: Bowel sounds are normal. She exhibits no distension. There is no tenderness.    Musculoskeletal: Normal range of motion. She exhibits no edema and no tenderness.  Neurological: She is alert and oriented to person, place, and time. She has normal strength. No cranial nerve deficit or sensory deficit.  Skin: Skin is warm and dry. No rash noted.  Psychiatric: She has a normal mood and affect.    ED Course  Procedures (including critical care time)    COORDINATION OF CARE:  1:24PM - Toradol, ultram, phenergan, drug screen panel, and blood w/u will be ordered for the pt. ACT team will be consulted.    Labs Reviewed  ETHANOL  CBC WITH DIFFERENTIAL  COMPREHENSIVE METABOLIC PANEL  URINE RAPID DRUG SCREEN (HOSP PERFORMED)  ACETAMINOPHEN LEVEL   No results found.   No diagnosis found.  1:51 PM  Patient harassing RN about med's, requesting narcotics. I informed her that the initial treatment regimen was the only option for a first attempt at analgesia pending psych eval.   MDM  I personally performed the services described in this documentation, which was scribed in my presence. The recorded information has been reviewed and considered.  This patient with history of depression, chronic back pain, recent admission for suicidal ideation now presents with increasing suicidal ideation, persistent thoughts of hurting herself, and a plan.  On my exam the patient is in no distress.  She has chronic back pain, but no evidence of new neurologic compromise.  We discussed at length analgesics, the need for additional evaluation and management of her pain in her psychiatric condition.  The patient is medically clear for further evaluation.    Gerhard Munch, MD 10/22/11 1424

## 2011-10-22 NOTE — BH Assessment (Signed)
[facility] Assessment Progress Note      Pt accepted to [facility] to the service of Dr Janine Ores, room 300-02.  Support paperwork signed and faxed to [facility], no precert needed.  Pt has Medicare.  ED staff notified.  Pt will transport by security.

## 2011-10-22 NOTE — ED Notes (Signed)
Per pt sts recently at [facility] for suicidal ideation. Was there for 3 days and left. sts still having suicidal thoughts. No plan.

## 2011-10-22 NOTE — ED Notes (Signed)
Pt states she cannot urinate bc she urinated before she came

## 2011-10-22 NOTE — ED Notes (Signed)
Pt continuing to request her klonopin at this time - discussed w/ pt the need to moderate sedative drugs w/ other medications so that no adverse reactions occur. Pt verbalizes understanding however continues to insist that she receive klonopin prior to being transferred to [facility] d/t her anxieties and phobias.

## 2011-10-22 NOTE — ED Notes (Signed)
Pt wanded by security. 

## 2011-10-22 NOTE — ED Notes (Signed)
Sitter at bedside.

## 2011-10-22 NOTE — ED Notes (Signed)
Patient notified of need for urine sample.  
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
## 2011-10-23 ENCOUNTER — Encounter (HOSPITAL_COMMUNITY): Payer: Self-pay | Admitting: Emergency Medicine

## 2011-10-23 DIAGNOSIS — F1010 Alcohol abuse, uncomplicated: Secondary | ICD-10-CM | POA: Diagnosis present

## 2011-10-23 DIAGNOSIS — F1219 Cannabis abuse with unspecified cannabis-induced disorder: Secondary | ICD-10-CM | POA: Diagnosis present

## 2011-10-23 DIAGNOSIS — F1910 Other psychoactive substance abuse, uncomplicated: Secondary | ICD-10-CM

## 2011-10-23 DIAGNOSIS — F1994 Other psychoactive substance use, unspecified with psychoactive substance-induced mood disorder: Secondary | ICD-10-CM

## 2011-10-23 DIAGNOSIS — F319 Bipolar disorder, unspecified: Secondary | ICD-10-CM | POA: Diagnosis present

## 2011-10-23 MED ORDER — THIAMINE HCL 100 MG/ML IJ SOLN
100.0000 mg | Freq: Once | INTRAMUSCULAR | Status: AC
  Administered 2011-10-23: 100 mg via INTRAMUSCULAR

## 2011-10-23 MED ORDER — ONDANSETRON 4 MG PO TBDP
4.0000 mg | ORAL_TABLET | Freq: Four times a day (QID) | ORAL | Status: DC | PRN
  Administered 2011-10-23 – 2011-10-24 (×3): 4 mg via ORAL
  Filled 2011-10-23: qty 1

## 2011-10-23 MED ORDER — MAGNESIUM HYDROXIDE 400 MG/5ML PO SUSP
30.0000 mL | Freq: Every day | ORAL | Status: DC | PRN
  Administered 2011-10-24: 30 mL via ORAL

## 2011-10-23 MED ORDER — LOPERAMIDE HCL 2 MG PO CAPS: 2.0000 mg | ORAL_CAPSULE | ORAL | Status: DC | PRN

## 2011-10-23 MED ORDER — GABAPENTIN 300 MG PO CAPS
300.0000 mg | ORAL_CAPSULE | Freq: Three times a day (TID) | ORAL | Status: DC
  Administered 2011-10-23 – 2011-10-28 (×17): 300 mg via ORAL
  Filled 2011-10-23 (×22): qty 1

## 2011-10-23 MED ORDER — ALBUTEROL SULFATE HFA 108 (90 BASE) MCG/ACT IN AERS
2.0000 | INHALATION_SPRAY | Freq: Four times a day (QID) | RESPIRATORY_TRACT | Status: DC | PRN
  Administered 2011-10-23 – 2011-10-25 (×4): 2 via RESPIRATORY_TRACT
  Filled 2011-10-23: qty 6.7

## 2011-10-23 MED ORDER — HYDROCHLOROTHIAZIDE 25 MG PO TABS
25.0000 mg | ORAL_TABLET | Freq: Every day | ORAL | Status: DC
  Administered 2011-10-23 – 2011-10-28 (×6): 25 mg via ORAL
  Filled 2011-10-23 (×8): qty 1

## 2011-10-23 MED ORDER — ADULT MULTIVITAMIN W/MINERALS CH
1.0000 | ORAL_TABLET | Freq: Every day | ORAL | Status: DC
Start: 2011-10-23 — End: 2011-10-28
  Administered 2011-10-24 – 2011-10-26 (×3): 1 via ORAL
  Filled 2011-10-23 (×8): qty 1

## 2011-10-23 MED ORDER — ACETAMINOPHEN 325 MG PO TABS
650.0000 mg | ORAL_TABLET | Freq: Four times a day (QID) | ORAL | Status: DC | PRN
  Administered 2011-10-24 – 2011-10-28 (×10): 650 mg via ORAL
  Filled 2011-10-23: qty 2

## 2011-10-23 MED ORDER — CLONAZEPAM 0.5 MG PO TABS
0.5000 mg | ORAL_TABLET | Freq: Three times a day (TID) | ORAL | Status: DC
  Administered 2011-10-23 – 2011-10-25 (×8): 0.5 mg via ORAL
  Filled 2011-10-23 (×8): qty 1

## 2011-10-23 MED ORDER — PROMETHAZINE HCL 25 MG RE SUPP: 25.0000 mg | Freq: Four times a day (QID) | RECTAL | Status: DC | PRN

## 2011-10-23 MED ORDER — VITAMIN B-1 100 MG PO TABS
100.0000 mg | ORAL_TABLET | Freq: Every day | ORAL | Status: DC
  Administered 2011-10-24 – 2011-10-26 (×3): 100 mg via ORAL
  Filled 2011-10-23 (×7): qty 1

## 2011-10-23 MED ORDER — ALUM & MAG HYDROXIDE-SIMETH 200-200-20 MG/5ML PO SUSP: 30.0000 mL | ORAL | Status: DC | PRN

## 2011-10-23 MED ORDER — VERAPAMIL HCL 40 MG PO TABS: 40.0000 mg | ORAL_TABLET | Freq: Every day | ORAL | Status: DC

## 2011-10-23 MED ORDER — CHLORDIAZEPOXIDE HCL 25 MG PO CAPS
25.0000 mg | ORAL_CAPSULE | Freq: Four times a day (QID) | ORAL | Status: DC | PRN
  Administered 2011-10-23 – 2011-10-25 (×4): 25 mg via ORAL
  Filled 2011-10-23 (×4): qty 1

## 2011-10-23 MED ORDER — QUETIAPINE FUMARATE ER 300 MG PO TB24
300.0000 mg | ORAL_TABLET | Freq: Every day | ORAL | Status: DC
  Filled 2011-10-23 (×2): qty 1

## 2011-10-23 MED ORDER — CITALOPRAM HYDROBROMIDE 40 MG PO TABS
40.0000 mg | ORAL_TABLET | Freq: Every day | ORAL | Status: DC
  Administered 2011-10-23 – 2011-10-28 (×6): 40 mg via ORAL
  Filled 2011-10-23 (×9): qty 1

## 2011-10-23 MED ORDER — NICOTINE 21 MG/24HR TD PT24
21.0000 mg | MEDICATED_PATCH | Freq: Every day | TRANSDERMAL | Status: DC
  Administered 2011-10-24 – 2011-10-28 (×6): 21 mg via TRANSDERMAL
  Filled 2011-10-23 (×9): qty 1

## 2011-10-23 MED ORDER — QUETIAPINE FUMARATE 400 MG PO TABS
400.0000 mg | ORAL_TABLET | Freq: Every day | ORAL | Status: DC
  Administered 2011-10-23 – 2011-10-25 (×3): 400 mg via ORAL
  Filled 2011-10-23 (×6): qty 1

## 2011-10-23 MED ORDER — HYDROXYZINE HCL 25 MG PO TABS
25.0000 mg | ORAL_TABLET | Freq: Four times a day (QID) | ORAL | Status: DC | PRN
  Administered 2011-10-24: 25 mg via ORAL

## 2011-10-23 MED ORDER — SUMATRIPTAN SUCCINATE 25 MG PO TABS
100.0000 mg | ORAL_TABLET | ORAL | Status: DC | PRN
  Administered 2011-10-23 – 2011-10-25 (×7): 100 mg via ORAL
  Filled 2011-10-23 (×7): qty 4

## 2011-10-23 MED ORDER — LAMOTRIGINE 100 MG PO TABS
100.0000 mg | ORAL_TABLET | Freq: Every day | ORAL | Status: DC
  Administered 2011-10-23 – 2011-10-28 (×6): 100 mg via ORAL
  Filled 2011-10-23 (×8): qty 1

## 2011-10-23 MED ORDER — CITALOPRAM HYDROBROMIDE 40 MG PO TABS
40.0000 mg | ORAL_TABLET | Freq: Every day | ORAL | Status: DC
  Administered 2011-10-23: 40 mg via ORAL
  Filled 2011-10-23 (×3): qty 1

## 2011-10-23 MED ORDER — TRAZODONE HCL 50 MG PO TABS
50.0000 mg | ORAL_TABLET | Freq: Every evening | ORAL | Status: DC | PRN
  Administered 2011-10-23 – 2011-10-27 (×4): 50 mg via ORAL
  Filled 2011-10-23 (×4): qty 1

## 2011-10-23 MED ORDER — VERAPAMIL HCL 40 MG PO TABS
40.0000 mg | ORAL_TABLET | Freq: Every day | ORAL | Status: DC
  Administered 2011-10-23 – 2011-10-25 (×3): 40 mg via ORAL
  Filled 2011-10-23 (×7): qty 1

## 2011-10-23 MED ORDER — TRAZODONE HCL 50 MG PO TABS: 50.0000 mg | ORAL_TABLET | Freq: Every evening | ORAL | Status: DC | PRN

## 2011-10-23 NOTE — Progress Notes (Signed)
Patient ID: [name], female   DOB: 01/19/1966, 46 y.o.   MRN: [id number]  Pt did not attend aftercare planning group.

## 2011-10-23 NOTE — H&P (Signed)
Psychiatric Admission Assessment Adult  Patient Identification:  [name] Date of Evaluation:  10/23/2011 46yo DWF CC: therapist called [employer] because she was verbally threatening to hurt daughter's BF was under the influence ETOH 148 & THC  History of Present Illness: Therapist had police pick patient up and bring to [facility].  She is very annoyed today at being on the SA unit-does NOT use drugs or ALCOHOL. Says that the substances documented were a one time use as she was angry.Primarily focused on her Pain.    Past Psychiatric History: She reports at least 20 admissions to [facility] since 1997. Lives with younger daughter this is first admission here.   Substance Abuse History: Denies   Social History:    reports that she has been smoking Cigarettes.  She has a 30 pack-year smoking history. She has never used smokeless tobacco. She reports that she drinks alcohol. She reports that she does not use illicit drugs. Finished 6th grade . Married and divorced twice. 2 daughters ages 64 & 53. Receives Disability for her Mental Illness.   Family Psych History: Niece is Bipolar and niece's child has ADHD.  Past Medical History:     Past Medical History  Diagnosis Date  . Bipolar 1 disorder   . Schizophrenia   . Anxiety   . Fibromyalgia   . GERD (gastroesophageal reflux disease)   . Hypertension   . Migraine        Past Surgical History  Procedure Date  . Cesarean section     Allergies:  Allergies  Allergen Reactions  . Prednisone Shortness Of Breath and Swelling  . Nsaids Other (See Comments)    GERD  . Sulfa Antibiotics Swelling    Current Medications:  Prior to Admission medications   Medication Sig Start Date End Date Taking? Authorizing Provider  albuterol (PROVENTIL HFA;VENTOLIN HFA) 108 (90 BASE) MCG/ACT inhaler Inhale 2 puffs into the lungs every 6 (six) hours as needed. For shortness of breath    Historical Provider, MD  Aspirin-Acetaminophen-Caffeine  (GOODY HEADACHE PO) Take 1 packet by mouth every 6 (six) hours as needed. FOR PAIN    Historical Provider, MD  butalbital-acetaminophen-caffeine (FIORICET, ESGIC) 50-325-40 MG per tablet Take 1 tablet by mouth 2 (two) times daily as needed. For migraines    Historical Provider, MD  citalopram (CELEXA) 40 MG tablet Take 40 mg by mouth daily.    Historical Provider, MD  clonazePAM (KLONOPIN) 1 MG tablet Take 1 mg by mouth 3 (three) times daily as needed. For anxiety.    Historical Provider, MD  gabapentin (NEURONTIN) 300 MG capsule Take 300 mg by mouth 2 (two) times daily. At noon and 4pm.    Historical Provider, MD  hydrochlorothiazide (HYDRODIURIL) 25 MG tablet Take 25 mg by mouth daily.    Historical Provider, MD  HYDROcodone-acetaminophen (LORCET) 10-650 MG per tablet Take 1 tablet by mouth every 8 (eight) hours as needed. Pain.    Historical Provider, MD  lamoTRIgine (LAMICTAL) 100 MG tablet Take 100 mg by mouth daily.    Historical Provider, MD  promethazine (PHENERGAN) 25 MG suppository Place 25 mg rectally as needed. As directed for acute nausea.    Historical Provider, MD  QUEtiapine (SEROQUEL) 200 MG tablet Take 400 mg by mouth at bedtime.    Historical Provider, MD  verapamil (CALAN) 40 MG tablet Take 40 mg by mouth at bedtime.    Historical Provider, MD  zolpidem (AMBIEN) 5 MG tablet Take 5 mg by mouth at  bedtime as needed. For sleep    Historical Provider, MD    Mental Status Examination/Evaluation: Objective:  Appearance: Fairly Groomed  Psychomotor Activity:  Normal  Eye Contact::  Good  Speech:  Clear and Coherent and Normal Rate  Volume:  Normal  Mood:  Some lability   Affect:  Congruent  Thought Process:    Orientation:  Full  Thought Content:  Clear rational goal oriented- wants pain meds   Suicidal Thoughts:  No  Homicidal Thoughts:  Yes.  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  AXIS IV economic problems, educational problems, housing problems, occupational problems, other psychosocial or environmental problems and problems with primary support group  AXIS V 51-60 moderate symptoms     Treatment Plan Summary: Admit for safety & stabilization  Detox as indicated  Adjust meds as indicated  Has outpatient provider for psych and pain managent Agree with H&P from ED

## 2011-10-23 NOTE — BHH Counselor (Signed)
Adult Comprehensive Assessment  Patient ID: [name], female   DOB: 20-Aug-1965, 46 y.o.   MRN: [id number]  Information Source: Information source: Patient  Current Stressors:  Educational / Learning stressors: 6 th grade, no GED Employment / Job issues: on disablity Family Relationships: much stress here neice has her mother and is contorling most of the choices with her care. Financial / Lack of resources (include bankruptcy): liminted income Housing / Lack of housing: homeless Physical health (include injuries & life threatening diseases): NA Social relationships: has some supports Substance abuse: denies abuse Bereavement / Loss: NA  Living/Environment/Situation:  Living Arrangements: Alone Living conditions (as described by patient or guardian): "ok" How long has patient lived in current situation?: few months, tring to get into shelters but they are full What is atmosphere in current home: Temporary  Family History:  Marital status: Single Does patient have children?: Yes How many children?: 2  (2 daughters) How is patient's relationship with their children?: close with one does not talk to the other  Childhood History:  By whom was/is the patient raised?: Both parents Additional childhood history information: Mother is being cared for by pt's neice and pt doe snot agree with her choices for her mother Description of patient's relationship with caregiver when they were a child: "good" Patient's description of current relationship with people who raised him/her: "good" Does patient have siblings?: Yes Number of Siblings: 1  (sister) Description of patient's current relationship with siblings: does not speak Did patient suffer any verbal/emotional/physical/sexual abuse as a child?: No Did patient suffer from severe childhood neglect?: No Has patient ever been sexually abused/assaulted/raped as an adolescent or adult?: No Was the patient ever a victim of a crime or a  disaster?: No Witnessed domestic violence?: No Has patient been effected by domestic violence as an adult?: No  Education:  Highest grade of school patient has completed: 6th grade Currently a student?: No Name of school: NA Learning disability?: No  Employment/Work Situation:   Employment situation: On disability Why is patient on disability: "mental health" How long has patient been on disability: 2 Patient's job has been impacted by current illness: Yes Describe how patient's job has been implacted: can't work What is the longest time patient has a held a job?: NA Where was the patient employed at that time?: NA Has patient ever been in the [employer]?: No Has patient ever served in Buyer, retail?: No  Financial Resources:   Surveyor, quantity resources: [employer];Food stamps Does patient have a representative payee or guardian?: No  Alcohol/Substance Abuse:   What has been your use of drugs/alcohol within the last 12 months?: pt denies abuse If attempted suicide, did drugs/alcohol play a role in this?: No Alcohol/Substance Abuse Treatment Hx: Denies past history If yes, describe treatment: NA Has alcohol/substance abuse ever caused legal problems?: No  Social Support System:   Conservation officer, nature Support System: Fair Museum/gallery exhibitions officer System: friends and 1 daughter Type of faith/religion: baptist How does patient's faith help to cope with current illness?: "try to attend" can't due to lack of transportation  Leisure/Recreation:   Leisure and Hobbies: Spend time with daughter  Strengths/Needs:   What things does the patient do well?: help others In what areas does patient struggle / problems for patient: depression, no place to stay  Discharge Plan:   Does patient have access to transportation?: No Plan for no access to transportation at discharge: bus when cna afford it Will patient be returning to same living situation  after discharge?: No Plan for living situation  after discharge: would like to move in with daughter but reports her daghters boyfriend is not ok with this Currently receiving community mental health services: Yes (From Whom) ([facility]) If no, would patient like referral for services when discharged?: Yes (What county?) (any reffereals in [place]) Does patient have financial barriers related to discharge medications?: Yes Patient description of barriers related to discharge medications: limnted income  Summary/Recommendations:   Summary and Recommendations (to be completed by the evaluator): Pt is a 46 yr old female. She is homeless and would like to go live with her daughter. She states that the daughters boyfriend tells the daughter that the pt will harm the daughter and wont let her stay there. Pt is still hopeful something can be worked out. Recommendations include crisis stabilization, case management, medication management, psycho-education groups to teach coping skills and group therapy.   Gevena Mart. 10/23/2011

## 2011-10-23 NOTE — Progress Notes (Signed)
Patient did not attend this evenings speaker AA meeting.  

## 2011-10-23 NOTE — Progress Notes (Signed)
Psychoeducational Group Note  Date:  10/23/2011 Time:  1515  Group Topic/Focus:  Healthy Communication:   The focus of this group is to discuss communication, barriers to communication, as well as healthy ways to communicate with others.  Participation Level:  Did Not Attend  Dalia Heading 10/23/2011, 6:33 PM

## 2011-10-23 NOTE — Progress Notes (Signed)
D. Pt pleasant on approach, but did not feel like getting out of bed to attend evening AA group.  Pt inquired when she would next receive her medication.  Denies SI/HI/hallucinaitons at this time.  No complaints voiced at this time.  A.  Support and encouragement offered R.  Pleased to get her Seroquel back on 400 mg as she was taking at home.  No acute distress noted.  Will continue to monitor.

## 2011-10-23 NOTE — ED Provider Notes (Signed)
Medical screening examination/treatment/procedure(s) were performed by non-physician practitioner and as supervising physician I was immediately available for consultation/collaboration.  Georgia Delsignore, MD 10/23/11 1743 

## 2011-10-23 NOTE — BHH Suicide Risk Assessment (Addendum)
Suicide Risk Assessment  Admission Assessment     Nursing information obtained from:  Patient Demographic factors:  Caucasian;Low socioeconomic status;Unemployed Current Mental Status:  See below Loss Factors:   unable to function well, disabled Historical Factors:   15 SI attemtps Risk Reduction Factors:  Living with another person, especially a relative;Positive therapeutic relationship  CLINICAL FACTORS:   Alcohol/Substance Abuse/Dependencies  COGNITIVE FEATURES THAT CONTRIBUTE TO RISK:  Closed-mindedness    SUICIDE RISK:   Mild:  Suicidal ideation of limited frequency, intensity, duration, and specificity.  There are no identifiable plans, no associated intent, mild dysphoria and related symptoms, good self-control (both objective and subjective assessment), few other risk factors, and identifiable protective factors, including available and accessible social support.  PLAN OF CARE: Mental Status Examination/Evaluation:  Objective: Appearance: Fairly Groomed   Psychomotor Activity: Normal   Eye Contact:: Good   Speech: Clear and Coherent and Normal Rate   Volume: Normal   Mood: sad  Affect: Congruent   Thought Process:   Orientation: Full   Thought Content: Clear rational goal oriented- wants pain meds   Suicidal Thoughts: No   Homicidal Thoughts: Yes. without intent/plan   Judgement: Impaired   Insight: Shallow   DIAGNOSIS:  AXIS I  alchol Abuse, cannabis abuse, r/o Substance Induced Mood Disorder   AXIS II  Deferred   AXIS III  See medical history.   AXIS IV  economic problems, educational problems, housing problems, occupational problems, other psychosocial or environmental problems and problems with primary support group   AXIS V  35     Treatment Plan Summary:  Admit for safety & stabilization  Detox as indicated  Adjust meds as indicated  Has outpatient provider for psych and pain managent   Wonda Cerise 10/23/2011, 3:51 PM

## 2011-10-23 NOTE — Progress Notes (Signed)
Patient ID: AOLANI PIGGOTT, female   DOB: September 24, 1965, 46 y.o.   MRN: [id number]   [facility] Group Notes:  (Counselor/Nursing/MHT/Case Management/Adjunct)  10/23/2011 1:15 PM  Type of Therapy:  Group Therapy, Dance/Movement Therapy   Participation Level:  Did Not Attend     Cassidi Long 10/23/2011. 2:22 PM

## 2011-10-23 NOTE — Progress Notes (Signed)
D- C/O disagreements with roommates. A- Irritable and demanding. R- Encouraged to settle differences between the two of them. Continues to be med seeking and agitated.

## 2011-10-23 NOTE — H&P (Signed)
  Pt was seen by me today and I agree with the key elements documented in H&P.  

## 2011-10-23 NOTE — Progress Notes (Signed)
[facility] Adult Inpatient Family/Significant Other Suicide Prevention Education  Suicide Prevention Education:  Education Completed; Amil Amen (daughter) [phone] has been identified by the patient as the family member/significant other with whom the patient will be residing, and identified as the person(s) who will aid the patient in the event of a mental health crisis (suicidal ideations/suicide attempt).  With written consent from the patient, the family member/significant other has been provided the following suicide prevention education, prior to the and/or following the discharge of the patient.  The suicide prevention education provided includes the following:  Suicide risk factors  Suicide prevention and interventions  National Suicide Hotline telephone number  [facility] assessment telephone number  [facility] Emergency Assistance 911  [facility] and/or Residential Mobile Crisis Unit telephone number  Request made of family/significant other to:  Remove weapons (e.g., guns, rifles, knives), all items previously/currently identified as safety concern.    Remove drugs/medications (over-the-counter, prescriptions, illicit drugs), all items previously/currently identified as a safety concern.  The family member/significant other verbalizes understanding of the suicide prevention education information provided.  The family member/significant other agrees to remove the items of safety concern listed above.  Daughter reports that the pt threatened to "kill" her daughter and that she does not want to be around her right now. Daughter states that the pt has been drinking and not taking her medications and that is why she threatened the daughter. Daughter knows that the pt has friends that she can stay with. Daughter wants the pt to stop drinking but states that the pt cant/wont stop. Daughter reports that the pt took out "pappers" saying the daughter was threatening her and  there is a court date pending. Daughter reports that the pt's memory and reflex's are slow and are not helping. And that they are "bringing her down" and she is "sleepy" and has no energy and slurred speech. Daughter reports that the pt is drinking 3-4 drinks a week. And that she becomes "violent" when she drinks.    [facility] 10/23/2011, 4:26 PM

## 2011-10-23 NOTE — Progress Notes (Signed)
D- Patient is out in milieu. No am group attendance.  A- Arica is focused on pain medication. Current scheduled meds reviewed and options offered.  Requested 500 hall programming and this was discussed with provider. Declined patient inventory. Denies SI. Irritable/demanding. R- Support and gentle redirection given. Continue current POC and evaluation of treatment goals. Continue 15' checks for safety.

## 2011-10-23 NOTE — Tx Team (Signed)
Initial Interdisciplinary Treatment Plan  PATIENT STRENGTHS: (choose at least two) Capable of independent living Communication skills Supportive family/friends  PATIENT STRESSORS: Chronic Pain   PROBLEM LIST: Problem List/Patient Goals Date to be addressed Date deferred Reason deferred Estimated date of resolution  Depression 10/23/11     Suicide Risk 10/23/11     Chronic Pain 10/23/11                                          DISCHARGE CRITERIA:  Improved stabilization in mood, thinking, and/or behavior Motivation to continue treatment in a less acute level of care Need for constant or close observation no longer present Verbal commitment to aftercare and medication compliance  PRELIMINARY DISCHARGE PLAN: Attend aftercare/continuing care group Return to previous living arrangement  PATIENT/FAMIILY INVOLVEMENT: This treatment plan has been presented to and reviewed with the patient, [name], and/or family member, .  The patient and family have been given the opportunity to ask questions and make suggestions.  [name] 10/23/2011, 12:19 AM

## 2011-10-23 NOTE — Progress Notes (Signed)
Patient ID: [name], female   DOB: [birth year], 46 y.o.   MRN: [id number]  Pt admitted voluntarily to [facility] with suicidal ideation with no plan. Pt totally focused on obtaining her pain medications throughout the admission process. Pt needing frequent redirection to stay on point. Pt appearing drowsy and with slurred speech; pt states she had been given her HS meds just PTA. Pt states she had had depression for years and now wants to get better. Pt lives with her daughter who she says is a great support system for her. Pt with dull, flat affect, endorsing depression. Pt verbally contracts for safety at this time. Pt oriented to the unit rules and regulations and is in agreement to such. Pt will be monitored Q 15 minutes for safety.

## 2011-10-24 DIAGNOSIS — F1010 Alcohol abuse, uncomplicated: Secondary | ICD-10-CM

## 2011-10-24 DIAGNOSIS — F39 Unspecified mood [affective] disorder: Secondary | ICD-10-CM

## 2011-10-24 MED ORDER — MAGNESIUM CITRATE PO SOLN
1.0000 | Freq: Once | ORAL | Status: AC
  Administered 2011-10-24: 1 via ORAL

## 2011-10-24 NOTE — Progress Notes (Signed)
Psychoeducational Group Note  Date:  10/24/2011 Time: 1015  Group Topic/Focus:  Crisis Planning:   The purpose of this group is to help patients create a crisis plan for use upon discharge or in the future, as needed.  Participation Level:  Did Not Attend  Participation Quality:  did not attend  Affect:  Irritable  Cognitive:  Appropriate  Insight:  None  Engagement in Group:  None  Additional Comments:    Cresenciano Lick 10/24/2011, 1:35 PM

## 2011-10-24 NOTE — Progress Notes (Signed)
Patient ID: JODETTE WIK, female   DOB: 11/06/1965, 46 y.o.   MRN: [id number]   [facility] Group Notes:  (Counselor/Nursing/MHT/Case Management/Adjunct)  10/24/2011 1:15 PM  Type of Therapy:  Group Therapy, Dance/Movement Therapy   Participation Level:  Did Not Attend   Cassidi Long 10/24/2011. 2:49 PM

## 2011-10-24 NOTE — Progress Notes (Signed)
Patient ID: [name], female   DOB: 28-Sep-1965, 46 y.o.   MRN: [id number]  Pt. attended and participated in aftercare planning group. Pt. verbally accepted information on suicide prevention, warning signs to look for with suicide and crisis line numbers to use. Pt. shared that she was feeling ok and that she was ready to return home.

## 2011-10-24 NOTE — Progress Notes (Signed)
Psychoeducational Group Note  Date:  10/24/2011 Time:  1515  Group Topic/Focus:  Conflict Resolution:   The focus of this group is to discuss the conflict resolution process and how it may be used upon discharge.  Participation Level:  Did Not Attend  Dalia Heading 10/24/2011, 4:09 PM

## 2011-10-24 NOTE — Progress Notes (Signed)
D.  Pt pleasant on approach.  Pt initially had what she described as severe head pain but did well after being medicated and was able to attend evening AA group.  Pt also reported no bowel movement in the last five days.  Pt was given MOM earlier on dayshift with no results.  Pt denies SI/HI/hallucinations at this time. Interacting appropriately within milieu.  A.  Doctor on call notified of constipation and orders received.  Support and encouragement offered.  R.  Pt pleased with order.  In dayroom watching TV with peers, no distress noted.

## 2011-10-24 NOTE — Progress Notes (Signed)
D-Patient out on unit interacting but unwilling to participate in group. A- No  o bservable distress when in milieu, but med seeking when communicating with staff. 72 hour request for d/c signed at 1210. Irritable and angry. Refused patient inventory. Denies SI/HI.  States "I need to go to the ED for a shot." R- Boundaries set for inappropriate  Language. Continue current POC and evaluation of goals. Continue 15' checks for safety.

## 2011-10-24 NOTE — Progress Notes (Signed)
[facility] MD Progress Note  10/24/2011 11:52 AM  Diagnosis:   Axis I: Alcohol Abuse, Mood Disorder NOS and Substance Abuse Axis II: Deferred Axis III:  Past Medical History  Diagnosis Date  . Bipolar 1 disorder   . Schizophrenia   . Anxiety   . Fibromyalgia   . GERD (gastroesophageal reflux disease)   . Hypertension   . Migraine    Subjective: [name] was lying in bed today before noon, and reports that she is depressed over her recent actions. She expresses much regret and remorse, but she seems to have an agenda to get discharged. She tries to minimize her mental illness and substance abuse. She wants to be discharged and reports that she has no thoughts of harm toward others or herself. She denies that she is having any auditory or visual hallucinations. She reports that her sleep has been good, and a rapid type is good as well.  ADL's:  Intact  Sleep: Good  Appetite:  Good  Suicidal Ideation:  Patient denies any thought, plan, or intent Homicidal Ideation:  Patient denies any thought, plan, or intent  AEB (as evidenced by):  Mental Status Examination/Evaluation: Objective:  Appearance: Casual  Eye Contact::  Fair  Speech:  Clear and Coherent  Volume:  Normal  Mood:  Dysphoric and Irritable  Affect:  Congruent  Thought Process:  Circumstantial  Orientation:  Full  Thought Content:  WDL  Suicidal Thoughts:  No  Homicidal Thoughts:  No  Memory:  Immediate;   Good Recent;   Fair Remote;   Fair  Judgement:  Poor  Insight:  Lacking  Psychomotor Activity:  Normal  Concentration:  Good  Recall:  Good  Akathisia:  No  Handed:    AIMS (if indicated):     Assets:  Resilience  Sleep:  Number of Hours: 6    Vital Signs:Blood pressure 111/72, pulse 80, temperature 97.9 F (36.6 C), temperature source Oral, resp. rate 20, height 5\' 6"  (1.676 m), weight 69.854 kg (154 lb). Current Medications: Current Facility-Administered Medications  Medication Dose Route Frequency  Provider Last Rate Last Dose  . acetaminophen (TYLENOL) tablet 650 mg  650 mg Oral Q6H PRN Mickeal Skinner, MD   650 mg at 10/24/11 0647  . albuterol (PROVENTIL HFA;VENTOLIN HFA) 108 (90 BASE) MCG/ACT inhaler 2 puff  2 puff Inhalation Q6H PRN Mickeal Skinner, MD   2 puff at 10/24/11 0752  . alum & mag hydroxide-simeth (MAALOX/MYLANTA) 200-200-20 MG/5ML suspension 30 mL  30 mL Oral Q4H PRN Mickeal Skinner, MD      . chlordiazePOXIDE (LIBRIUM) capsule 25 mg  25 mg Oral Q6H PRN Mickeal Skinner, MD   25 mg at 10/24/11 0647  . citalopram (CELEXA) tablet 40 mg  40 mg Oral Daily Mickeal Skinner, MD   40 mg at 10/24/11 0750  . clonazePAM (KLONOPIN) tablet 0.5 mg  0.5 mg Oral TID PC Mickeal Skinner, MD   0.5 mg at 10/24/11 0752  . gabapentin (NEURONTIN) capsule 300 mg  300 mg Oral TID Mickeal Skinner, MD   300 mg at 10/24/11 0750  . hydrochlorothiazide (HYDRODIURIL) tablet 25 mg  25 mg Oral Daily Mickeal Skinner, MD   25 mg at 10/24/11 0750  . hydrOXYzine (ATARAX/VISTARIL) tablet 25 mg  25 mg Oral Q6H PRN Mickeal Skinner, MD      . lamoTRIgine (LAMICTAL) tablet 100 mg  100 mg Oral Daily Mickeal Skinner, MD   100 mg at 10/24/11 0750  . loperamide (IMODIUM) capsule 2-4 mg  2-4 mg Oral PRN Mickeal Skinner, MD      . magnesium hydroxide (MILK OF MAGNESIA) suspension 30 mL  30 mL Oral Daily PRN Mickeal Skinner, MD      . multivitamin with minerals tablet 1 tablet  1 tablet Oral Daily Mickeal Skinner, MD   1 tablet at 10/24/11 0749  . nicotine (NICODERM CQ - dosed in mg/24 hours) patch 21 mg  21 mg Transdermal Daily Mickie D. Adams, PA   21 mg at 10/24/11 0750  . ondansetron (ZOFRAN-ODT) disintegrating tablet 4 mg  4 mg Oral Q6H PRN Mickeal Skinner, MD   4 mg at 10/23/11 1154  . promethazine (PHENERGAN) suppository 25 mg  25 mg Rectal Q6H PRN Mickeal Skinner, MD      . QUEtiapine (SEROQUEL) tablet 400 mg  400 mg Oral QHS Mickie D. Adams, PA   400 mg at 10/23/11 2123  . SUMAtriptan (IMITREX) tablet 100 mg  100 mg Oral Q2H PRN  Mickeal Skinner, MD   100 mg at 10/24/11 0650  . thiamine (VITAMIN B-1) tablet 100 mg  100 mg Oral Daily Mickeal Skinner, MD   100 mg at 10/24/11 0750  . traZODone (DESYREL) tablet 50 mg  50 mg Oral QHS PRN Mickeal Skinner, MD   50 mg at 10/23/11 2124  . verapamil (CALAN) tablet 40 mg  40 mg Oral QHS Mickeal Skinner, MD   40 mg at 10/23/11 2124  . DISCONTD: citalopram (CELEXA) tablet 40 mg  40 mg Oral Daily Mickie D. Adams, PA   40 mg at 10/23/11 1712  . DISCONTD: QUEtiapine (SEROQUEL XR) 24 hr tablet 300 mg  300 mg Oral QHS Mickeal Skinner, MD        Lab Results:  Results for orders placed during the hospital encounter of 10/22/11 (from the past 48 hour(s))  ETHANOL     Status: Normal   Collection Time   10/22/11 12:52 PM      Component Value Range Comment   Alcohol, Ethyl (B) <11  0 - 11 mg/dL   CBC WITH DIFFERENTIAL     Status: Abnormal   Collection Time   10/22/11 12:52 PM      Component Value Range Comment   WBC 7.1  4.0 - 10.5 K/uL    RBC 4.01  3.87 - 5.11 MIL/uL    Hemoglobin 13.2  12.0 - 15.0 g/dL    HCT 09.8  11.9 - 14.7 %    MCV 100.5 (*) 78.0 - 100.0 fL    MCH 32.9  26.0 - 34.0 pg    MCHC 32.8  30.0 - 36.0 g/dL    RDW 82.9  56.2 - 13.0 %    Platelets 198  150 - 400 K/uL    Neutrophils Relative 61  43 - 77 %    Neutro Abs 4.4  1.7 - 7.7 K/uL    Lymphocytes Relative 29  12 - 46 %    Lymphs Abs 2.1  0.7 - 4.0 K/uL    Monocytes Relative 7  3 - 12 %    Monocytes Absolute 0.5  0.1 - 1.0 K/uL    Eosinophils Relative 2  0 - 5 %    Eosinophils Absolute 0.1  0.0 - 0.7 K/uL    Basophils Relative 0  0 - 1 %    Basophils Absolute 0.0  0.0 - 0.1 K/uL   COMPREHENSIVE METABOLIC PANEL     Status: Abnormal   Collection Time   10/22/11 12:52 PM  Component Value Range Comment   Sodium 139  135 - 145 mEq/L    Potassium 4.0  3.5 - 5.1 mEq/L    Chloride 102  96 - 112 mEq/L    CO2 26  19 - 32 mEq/L    Glucose, Bld 91  70 - 99 mg/dL    BUN 23  6 - 23 mg/dL    Creatinine, Ser 0.34  0.50 -  1.10 mg/dL    Calcium 9.0  8.4 - 74.2 mg/dL    Total Protein 7.2  6.0 - 8.3 g/dL    Albumin 4.1  3.5 - 5.2 g/dL    AST 22  0 - 37 U/L    ALT 16  0 - 35 U/L    Alkaline Phosphatase 92  39 - 117 U/L    Total Bilirubin 0.2 (*) 0.3 - 1.2 mg/dL    GFR calc non Af Amer 76 (*) >90 mL/min    GFR calc Af Amer 88 (*) >90 mL/min   ACETAMINOPHEN LEVEL     Status: Normal   Collection Time   10/22/11 12:52 PM      Component Value Range Comment   Acetaminophen (Tylenol), Serum <15.0  10 - 30 ug/mL     Physical Findings: AIMS: Facial and Oral Movements Muscles of Facial Expression: None, normal Lips and Perioral Area: None, normal Jaw: None, normal Tongue: None, normal,Extremity Movements Upper (arms, wrists, hands, fingers): None, normal Lower (legs, knees, ankles, toes): None, normal, Trunk Movements Neck, shoulders, hips: None, normal, Overall Severity Severity of abnormal movements (highest score from questions above): None, normal Incapacitation due to abnormal movements: None, normal Patient's awareness of abnormal movements (rate only patient's report): No Awareness, Dental Status Current problems with teeth and/or dentures?: No Does patient usually wear dentures?: No  CIWA:  CIWA-Ar Total: 3  COWS:     Treatment Plan Summary: Daily contact with patient to assess and evaluate symptoms and progress in treatment Medication management  Plan: We will continue her current plan of care which includes Klonopin 0.5 mg 3 times daily. She reports that she will increase that back to 1 mg once she is discharged. She is offered to sign a 72 hour request for discharge, and we will honor that if appropriate. She will return to followup with her previous providers.  Tineka Uriegas 10/24/2011, 11:52 AM

## 2011-10-25 MED ORDER — VITAMIN B-1 100 MG PO TABS
100.0000 mg | ORAL_TABLET | Freq: Every day | ORAL | Status: DC
  Filled 2011-10-25: qty 1

## 2011-10-25 MED ORDER — LOPERAMIDE HCL 2 MG PO CAPS: 2.0000 mg | ORAL_CAPSULE | ORAL | Status: AC | PRN

## 2011-10-25 MED ORDER — VERAPAMIL HCL 80 MG PO TABS
40.0000 mg | ORAL_TABLET | Freq: Every day | ORAL | Status: DC
  Filled 2011-10-25: qty 7

## 2011-10-25 MED ORDER — LAMOTRIGINE 100 MG PO TABS
100.0000 mg | ORAL_TABLET | Freq: Every day | ORAL | Status: DC
  Filled 2011-10-25: qty 14

## 2011-10-25 MED ORDER — CHLORDIAZEPOXIDE HCL 25 MG PO CAPS: 25.0000 mg | ORAL_CAPSULE | Freq: Every day | ORAL | Status: DC

## 2011-10-25 MED ORDER — CHLORDIAZEPOXIDE HCL 25 MG PO CAPS: 25.0000 mg | ORAL_CAPSULE | ORAL | Status: DC

## 2011-10-25 MED ORDER — CHLORDIAZEPOXIDE HCL 25 MG PO CAPS: 25.0000 mg | ORAL_CAPSULE | Freq: Four times a day (QID) | ORAL | Status: DC | PRN

## 2011-10-25 MED ORDER — HYDROXYZINE HCL 50 MG PO TABS
50.0000 mg | ORAL_TABLET | Freq: Three times a day (TID) | ORAL | Status: DC | PRN
  Administered 2011-10-25 – 2011-10-26 (×2): 50 mg via ORAL
  Filled 2011-10-25: qty 1

## 2011-10-25 MED ORDER — KETOROLAC TROMETHAMINE 30 MG/ML IJ SOLN
30.0000 mg | Freq: Once | INTRAMUSCULAR | Status: AC
  Administered 2011-10-25: 30 mg via INTRAMUSCULAR
  Filled 2011-10-25 (×2): qty 1

## 2011-10-25 MED ORDER — CHLORDIAZEPOXIDE HCL 25 MG PO CAPS
25.0000 mg | ORAL_CAPSULE | Freq: Four times a day (QID) | ORAL | Status: DC
  Administered 2011-10-25: 25 mg via ORAL
  Filled 2011-10-25 (×2): qty 1

## 2011-10-25 MED ORDER — CHLORDIAZEPOXIDE HCL 25 MG PO CAPS: 25.0000 mg | ORAL_CAPSULE | Freq: Three times a day (TID) | ORAL | Status: DC

## 2011-10-25 MED ORDER — HYDROCHLOROTHIAZIDE 25 MG PO TABS
25.0000 mg | ORAL_TABLET | Freq: Every day | ORAL | Status: DC
  Filled 2011-10-25: qty 14

## 2011-10-25 MED ORDER — ADULT MULTIVITAMIN W/MINERALS CH
1.0000 | ORAL_TABLET | Freq: Every day | ORAL | Status: DC
  Filled 2011-10-25 (×2): qty 1

## 2011-10-25 MED ORDER — GABAPENTIN 300 MG PO CAPS
300.0000 mg | ORAL_CAPSULE | Freq: Three times a day (TID) | ORAL | Status: DC
  Filled 2011-10-25: qty 42

## 2011-10-25 MED ORDER — HYDROXYZINE HCL 25 MG PO TABS: 25.0000 mg | ORAL_TABLET | Freq: Four times a day (QID) | ORAL | Status: DC | PRN

## 2011-10-25 MED ORDER — ONDANSETRON 4 MG PO TBDP
4.0000 mg | ORAL_TABLET | Freq: Four times a day (QID) | ORAL | Status: AC | PRN
  Administered 2011-10-25: 4 mg via ORAL
  Filled 2011-10-25: qty 1

## 2011-10-25 MED ORDER — CITALOPRAM HYDROBROMIDE 40 MG PO TABS
40.0000 mg | ORAL_TABLET | Freq: Every day | ORAL | Status: DC
  Filled 2011-10-25: qty 14

## 2011-10-25 MED ORDER — TRAZODONE HCL 50 MG PO TABS
50.0000 mg | ORAL_TABLET | Freq: Every evening | ORAL | Status: DC | PRN
  Filled 2011-10-25: qty 14

## 2011-10-25 MED ORDER — QUETIAPINE FUMARATE 400 MG PO TABS
400.0000 mg | ORAL_TABLET | Freq: Every day | ORAL | Status: DC
  Filled 2011-10-25: qty 14

## 2011-10-25 MED ORDER — THIAMINE HCL 100 MG/ML IJ SOLN: 100.0000 mg | Freq: Once | INTRAMUSCULAR | Status: DC

## 2011-10-25 MED ORDER — CHLORDIAZEPOXIDE HCL 25 MG PO CAPS
50.0000 mg | ORAL_CAPSULE | Freq: Once | ORAL | Status: AC
  Administered 2011-10-25: 50 mg via ORAL
  Filled 2011-10-25: qty 2

## 2011-10-25 NOTE — Treatment Plan (Signed)
Interdisciplinary Treatment Plan Update (Adult)  Date: 10/25/2011  Time Reviewed: 10:17 AM   Progress in Treatment: Attending groups: Yes Participating in groups: Yes Taking medication as prescribed: Yes Tolerating medication: Yes   Family/Significant othe contact made:Yes  Patient understands diagnosis:  Yes  As evidenced by asking for help with stabilization of crises Discussing patient identified problems/goals with staff:  Yes  See below Medical problems stabilized or resolved:  Yes Denies suicidal/homicidal ideation: Yes  In tx team Issues/concerns per patient self-inventory:  Yes  Pain a10 Other:  New problem(s) identified: N/A  Reason for Continuation of Hospitalization: Unclear of where pt will stay post d/c  Interventions implemented related to continuation of hospitalization: work with pt to identify safe place to stay,  Confirm [facility]'s standing with [facility], and get a follow up appointment  Possible med changes to increase sleep, which Barrett says is "poor"  Additional comments:  Estimated length of stay:1-2 days  Discharge Plan:  New goal(s): N/A  Review of initial/current patient goals per problem list:   1.  Goal(s): Eliminate SI  Met:  Yes  Target date:9/9  As evidenced ON:GEXB report in Tx team  2.  Goal (s):Stabilize mood  Met:  Yes  Target date:9/9  As evidenced MW:[id number] and hopelessness both 1's  3.  Goal(s): Identify safe place to live post d/c  Met:  No  Target date:9/9  As evidenced by: confirmation by CM.  [name] is currently saying she will stay at the [facility] on [address], and will follow up with [facility] on Wed.  4.  Goal(s):  Met:  Yes  Target date:  As evidenced by:  Attendees: Patient: [name]  10/25/2011 10:17 AM  Family:     Physician:  Verne Spurr 10/25/2011 10:17 AM   Nursing: [place]well Miners   10/25/2011 10:17 AM   Case Manager:  Richelle Ito, LCSW 10/25/2011 10:17 AM   Counselor:   Ronda Fairly, LCSWA 10/25/2011 10:17 AM   Other:     Other:     Other:     Other:      Scribe for Treatment Team:   Ida Rogue, 10/25/2011 10:17 AM

## 2011-10-25 NOTE — Progress Notes (Signed)
Psychoeducational Group Note  Date:  10/25/2011 Time:  1100  Group Topic/Focus:  Self Care:   The focus of this group is to help patients understand the importance of self-care in order to improve or restore emotional, physical, spiritual, interpersonal, and financial health.  Participation Level:  Active  Participation Quality:  Appropriate, Attentive and Sharing  Affect:  Appropriate  Cognitive:  Appropriate  Insight:  Good  Engagement in Group:  Good  Additional Comments:  Pt was very appropriate and sharing while attending group. Pt shared that she never take vacations and rarely receive  Massages. Pt also stated that she used to journal to communicate things to her therapist but has not wrote in her journal for a while now.   Sharyn Lull 10/25/2011, 11:56 AM

## 2011-10-25 NOTE — Progress Notes (Signed)
[facility] Group Notes:  (Counselor/Nursing/MHT/Case Management/Adjunct)  10/25/2011   Type of Therapy:  Group Therapy at 1:15 to 2:30 PM  Participation Level:  Active  Participation Quality:  Monopolizing while present  Affect:  Irritable  Cognitive:  Oriented  Insight:  Limited  Engagement in Group:  Limited  Engagement in Therapy:  None  Modes of Intervention:  Problem-solving and Support  Summary of Progress/Problems:  Group discussion focused on what patient's see as their own obstacles to recovery.  Patient shared frustrations regarding potential discharge process and complained about mixed messages from staff before leaving group 30 minutes into it.      Clide Dales 10/25/2011,

## 2011-10-25 NOTE — Discharge Planning (Signed)
[name] presented in AM group with plans to d/c today.  She stated she and her daughter got into a big fight "because she accused me of something that I did not do" and Conchita left the house.  She then drank and used cannabis, and called for help.  Stated her plan was to follow up with ACT team services at [facility].  It was not until treatment team that she admitted she was not returning to daughter's home, and then talked about staying with friends.  When she was told we would like to verify where she was staying, she admitted to not having any numbers, but said she would provide them.  Later came to me and asked me to place her in an ALF.  When I told her that could not happen for several days, she said she would stay in a motel because she has money on her card.  In the meantime, I called [facility], who confirmed that they had seen her in the past as an ACT team client, but not currently.  They would be glad to assess her for services, including SAIOP and ACT, and gave an appointment of 11:00 on Wed.  Went back to pt to present this, and she became angry as she is adamant that she has an ACT team and I am calling her a liar.

## 2011-10-25 NOTE — Progress Notes (Signed)
Patient did attend the evening speaker AA meeting.  

## 2011-10-25 NOTE — Progress Notes (Signed)
D. Pt. Reports anxiety.  Denies SI/HI and denies A/V hallucinations.  Pt. Intrusive. A.  Antianxiety medication given. R.  Pt.  Reported decrease in anxiety.

## 2011-10-25 NOTE — Progress Notes (Signed)
[facility] In Patient Progress Note  10/25/2011 12:38 PM [name] February 10, 1966 [id number]  Diagnosis:  Axis I: Alcohol/Substance Abuse/Dependencies   ADL's:  Intact  Sleep:  Yes,  AEB:  Appetite: enormous!  Suicidal Ideation: No suicidal ideation, no plan, no intent, no means.  Homicidal Ideation:  No homicidal ideation, no plan, no intent, no means.  Subjective: [name] met with the treatment team this morning stating she was ready to be discharged.  She reports that she is established with [facility] team and has an appointment with a psychiatrist this week.  She voices frustration that she is on the substance abuse floor reporting that she doesn't have a substance abuse problem.   height is 5\' 6"  (1.676 m) and weight is 69.854 kg (154 lb). Her oral temperature is 98.3 F (36.8 C). Her blood pressure is 102/71 and her pulse is 76. Her respiration is 20.   Objective: Her speech is clear but circumstantial and she misrepresented her relationship with [facility]. [name] is minimizing her history of mental illness, and has signed a 72 hour request for discharge.  Her story changes consistently through out the meeting with her needing her phone for contact numbers regarding a place to stay.  Mental Status: Level of Consciousness:  Alert Orientation: x 3 General Appearance : Casual NAD Behavior:  cooperative Eye Contact:  Fair Motor Behavior:  Normal Speech:  Normal Mood:  Euthymic Affect:  Appropriate Anxiety Level:  Moderate Thought Process:  Circumstantial Thought Content:  WNL Perception:  Normal Judgment:  Poor Insight:  Absent Cognition:  At least average Sleep:  Number of Hours: 6.5   Lab Results: No results found for this or any previous visit (from the past 48 hour(s)). Labs are reviewed. Physical Findings: AIMS: Facial and Oral Movements Muscles of Facial Expression: None, normal Lips and Perioral Area: None, normal Jaw: None, normal Tongue: None,  normal,Extremity Movements Upper (arms, wrists, hands, fingers): None, normal Lower (legs, knees, ankles, toes): None, normal, Trunk Movements Neck, shoulders, hips: None, normal, Overall Severity Severity of abnormal movements (highest score from questions above): None, normal Incapacitation due to abnormal movements: None, normal Patient's awareness of abnormal movements (rate only patient's report): No Awareness, Dental Status Current problems with teeth and/or dentures?: No Does patient usually wear dentures?: No  CIWA:  CIWA-Ar Total: 5  COWS:    Medication:   . citalopram  40 mg Oral Daily  . clonazePAM  0.5 mg Oral TID PC  . gabapentin  300 mg Oral TID  . hydrochlorothiazide  25 mg Oral Daily  . lamoTRIgine  100 mg Oral Daily  . magnesium citrate  1 Bottle Oral Once  . multivitamin with minerals  1 tablet Oral Daily  . nicotine  21 mg Transdermal Daily  . QUEtiapine  400 mg Oral QHS  . thiamine  100 mg Oral Daily  . verapamil  40 mg Oral QHS  . DISCONTD: citalopram  40 mg Oral Daily  . DISCONTD: gabapentin  300 mg Oral TID  . DISCONTD: hydrochlorothiazide  25 mg Oral Daily  . DISCONTD: lamoTRIgine  100 mg Oral Daily  . DISCONTD: QUEtiapine  400 mg Oral QHS  . DISCONTD: verapamil  40 mg Oral QHS  Treatment Plan Summary: Daily contact with patient to assess and evaluate symptoms and progress in treatment Medication management  Plan: 1.Continue PRNs 2. Discontinue clonazepam. 3. Initiate Librium detox protocol to cover for benzodiazepine withdrawal. 4. Continue Celexa 40mg  po qd for anxiety. 5.  Continue Gabapentin 300mg  po TID for pain. 6. Continue HCTZ for hypertension 25mg  po each AM. 7. Continue Lamotrigine 100mg  po at hs. For depression and mood disorder. 8.Continue Seroquel 400mg  qhs for depression and insomnia. 9. Continue Calan 40mg  po at hs for hypertension. 10. Initiate Hydroxyzine 50mg  po TID for anxiety. After obtaining consent this provider spoke to  Dr.Williams at her Urgent Care on [facility] in [place].  Dr. Mayford Knife verified that he is currently providing care for Yatziri due to her inability to maintain a psychiatric provider.  He felt that her pain was largely psychosomatic in nature and has limited her to a certain amount of meds.  He also has been treating her with Toradol for pain with acceptable results.  He felt that was appropriate to use here for her pain control as it was non narcotic. She does have an element of anxiety and Dr. Mayford Knife felt that using the Vistaril and Librium was reasonable for her.     Housing arrangements, follow up with Psychiatry at [facility] and establishing relationship with ACT services will be verified by CM.      Anticipate discharge tomorrow if arrangements can be made. [name]. [name] PAC 10/25/2011, 12:38 PM

## 2011-10-26 MED ORDER — KETOROLAC TROMETHAMINE 30 MG/ML IJ SOLN
INTRAMUSCULAR | Status: AC
  Filled 2011-10-26: qty 1

## 2011-10-26 MED ORDER — LORAZEPAM 1 MG PO TABS
1.0000 mg | ORAL_TABLET | Freq: Two times a day (BID) | ORAL | Status: DC
  Administered 2011-10-26 – 2011-10-28 (×4): 1 mg via ORAL
  Filled 2011-10-26 (×4): qty 1

## 2011-10-26 MED ORDER — KETOROLAC TROMETHAMINE 30 MG/ML IJ SOLN
15.0000 mg | Freq: Once | INTRAMUSCULAR | Status: AC
  Administered 2011-10-26: 15 mg via INTRAMUSCULAR

## 2011-10-26 MED ORDER — QUETIAPINE FUMARATE 300 MG PO TABS
300.0000 mg | ORAL_TABLET | Freq: Two times a day (BID) | ORAL | Status: DC
  Administered 2011-10-26 – 2011-10-28 (×5): 300 mg via ORAL
  Filled 2011-10-26 (×9): qty 1

## 2011-10-26 MED ORDER — SUMATRIPTAN SUCCINATE 25 MG PO TABS
25.0000 mg | ORAL_TABLET | Freq: Once | ORAL | Status: AC
  Administered 2011-10-26: 25 mg via ORAL
  Filled 2011-10-26 (×2): qty 1

## 2011-10-26 NOTE — Progress Notes (Signed)
Pt resting comfortably in bed. Respirations even, unlabored. Q15 min monitoring for safety.  

## 2011-10-26 NOTE — Progress Notes (Signed)
[facility] MD Progress Note  10/26/2011 12:22 PM  S/O: Patient seen and evaluated. Chart reviewed. Patient stated that her mood was "fine". Her affect was not mood congruent and was irritable and hypomanic. She denied any current thoughts of self injurious behavior, suicidal ideation or homicidal ideation. There were no auditory or visual hallucinations, paranoia, delusional thought processes, or mania noted.  Thought process was linear and goal directed.  Psychomotor agitation was noted. Speech was pressured and increased volume. Eye contact was good. Judgment and insight are limited.  Patient has been up and engaged on the unit.  No acute safety concerns reported from team.  Pt demanding to be discharged, but was started yesterday on librium taper to detox off clonazepam.  Pt in need of further psychiatric stabilization.  Sleep:  Number of Hours: 6.75    Vital Signs:Blood pressure 87/57, pulse 72, temperature 97.3 F (36.3 C), temperature source Oral, resp. rate 18, height 5\' 6"  (1.676 m), weight 69.854 kg (154 lb).  Current Medications:    . chlordiazePOXIDE  25 mg Oral QID   Followed by  . chlordiazePOXIDE  25 mg Oral TID   Followed by  . chlordiazePOXIDE  25 mg Oral BH-qamhs   Followed by  . chlordiazePOXIDE  25 mg Oral Daily  . chlordiazePOXIDE  50 mg Oral Once  . citalopram  40 mg Oral Daily  . gabapentin  300 mg Oral TID  . hydrochlorothiazide  25 mg Oral Daily  . ketorolac  15 mg Intramuscular Once  . ketorolac  30 mg Intramuscular Once  . ketorolac      . lamoTRIgine  100 mg Oral Daily  . multivitamin with minerals  1 tablet Oral Daily  . nicotine  21 mg Transdermal Daily  . QUEtiapine  300 mg Oral BID  . thiamine  100 mg Oral Daily  . verapamil  40 mg Oral QHS  . DISCONTD: clonazePAM  0.5 mg Oral TID PC  . DISCONTD: multivitamin with minerals  1 tablet Oral Daily  . DISCONTD: QUEtiapine  400 mg Oral QHS  . DISCONTD: thiamine  100 mg Intramuscular Once  . DISCONTD: thiamine   100 mg Oral Daily    Lab Results: No results found for this or any previous visit (from the past 48 hour(s)).  Physical Findings: AIMS: Facial and Oral Movements Muscles of Facial Expression: None, normal Lips and Perioral Area: None, normal Jaw: None, normal Tongue: None, normal,Extremity Movements Upper (arms, wrists, hands, fingers): None, normal Lower (legs, knees, ankles, toes): None, normal, Trunk Movements Neck, shoulders, hips: None, normal, Overall Severity Severity of abnormal movements (highest score from questions above): None, normal Incapacitation due to abnormal movements: None, normal Patient's awareness of abnormal movements (rate only patient's report): No Awareness, Dental Status Current problems with teeth and/or dentures?: No Does patient usually wear dentures?: No  CIWA:  CIWA-Ar Total: 0   A/P: Benzodiazepine W/D Disorder; Alcohol Use Disorder;  Cannabis Use Disorder; BPAD; HAs; Migraines, per Hx  Pt seen and evaluated in treatment team.  Reviewed short term and long term goals, medications, current treatment in the hospital and acute/chronic safety.  Pt denied any current thoughts of self harm, suicidal ideation or homicidal ideation.  Contracted for safety on the unit.  VS reviewed with team.  Pt demanding to leave, but was just started on a librium taper yesterday to come off clonazepam.  Pt in need of continued Tx for detox and mood stabilization.  Increase Seroquel to 300mg  po bid for acute  stability of mood.  Continue other meds as noted above.  Pt accepted to [facility] residential Tx on the 23rd, but remains ambivalent about Tx. Wants to f/u with [facility] for oupt Tx.  Discussed with team. Pt may be able to return to her daughter's s/p discharge before going to [facility].  Pt also requesting Imitrex for current HA, 10/10.  Nevertheless, no reported N/V/photophobia or change in behavior at lunch.  Will follow.  Lupe Carney 10/26/2011, 12:22  PM

## 2011-10-26 NOTE — Progress Notes (Signed)
Patient's daughter Amil Amen [phone]) reports she and her boyfriend, Junior are both comfortable with patient discharging to their home and remaining there until she enter's [facility] later this month.  Clide Dales 10/26/2011 12:43 PM

## 2011-10-26 NOTE — Progress Notes (Addendum)
D:  Patient with frequent requests for medications today.  Has not attended groups.  States she has a headache of 10 out of 10.  Did not get any relief from Tylenol.  One time dose of Imitrex was ordered.   A:  Imitrex given per order.  Patient instructed to follow up regarding effectiveness.   R:  Cooperative, but needy today.  Did have a decrease in the level of pain with Imitrex.  Patient reports relief of headache from Imitrex dose given earlier.

## 2011-10-26 NOTE — Progress Notes (Signed)
Psychoeducational Group Note  Date:  10/26/2011 Time:  1100  Group Topic/Focus:  Recovery Goals:   The focus of this group is to identify appropriate goals for recovery and establish a plan to achieve them.  Participation Level:  Active  Participation Quality:  Appropriate, Attentive and Sharing  Affect:  Appropriate  Cognitive:  Appropriate  Insight:  Good  Engagement in Group:  Good  Additional Comments:  Pt was appropriate majority of group and was willing to complete the worksheet that was used during group. Pt refused to share her goals stating that it was something negative about the [facility] facility.   Sharyn Lull 10/26/2011, 1:23 PM

## 2011-10-26 NOTE — [facility] Counselor (Signed)
[facility] Group Notes:  (Counselor/Nursing/MHT/Case Management/Adjunct)  10/26/2011 3:26 PM  Type of Therapy:  Group Therapy at 1:15 to 2:30  Participation Level:  Active  Participation Quality:  Intrusive and Monopolizing  Affect:  Anxious and Irritable  Cognitive:  Oriented  Insight:  Limited  Engagement in Group:  Good  Engagement in Therapy:  Unknown   Modes of Intervention:  Limit-setting and Support  Summary of Progress/Problems: [name] came in late to group presentation by Interior and spatial designer of  [employer] ([employer]). She was intrusive and inappropriate during session, as she could not refrain from commenting on any thing she identified with.  Multiple limits were set by both [facility] and writer without patient response.    Clide Dales 10/26/2011, 3:30 PM

## 2011-10-26 NOTE — Progress Notes (Signed)
10/26/2011         Time: 1500      Group Topic/Focus: The focus of the group is on enhancing the patients' ability to utilize positive relaxation strategies by practicing several that can be used at discharge.  Participation Level: Did not attend  Participation Quality: Not Applicable  Affect: Not Applicable  Cognitive: Not Applicable   Additional Comments: Patient refused group.   [name] 10/26/2011 3:46 PM  

## 2011-10-26 NOTE — Progress Notes (Signed)
Patient resting quietly with eyes closed. Respirations even and unlabored. No distress noted. Q 15 minute check continues to maintain safety   

## 2011-10-26 NOTE — Progress Notes (Signed)
[facility] Group Notes:  (Counselor/Nursing/MHT/Case Management/Adjunct)  10/26/2011 5:18 PM  Type of Therapy:  Psychoeducational Skills  Participation Level:  Did Not Attend  Summary of Progress/Problems: [name] did not attend psycho education group that focused in using quality time with support systems/individuals to engage in healthy coping skills and stregthen the healthy support system.    Wandra Scot 10/26/2011, 5:18 PM

## 2011-10-27 DIAGNOSIS — M5442 Lumbago with sciatica, left side: Secondary | ICD-10-CM

## 2011-10-27 DIAGNOSIS — G43109 Migraine with aura, not intractable, without status migrainosus: Secondary | ICD-10-CM

## 2011-10-27 DIAGNOSIS — Z8669 Personal history of other diseases of the nervous system and sense organs: Secondary | ICD-10-CM

## 2011-10-27 MED ORDER — KETOROLAC TROMETHAMINE 15 MG/ML IJ SOLN
15.0000 mg | Freq: Once | INTRAMUSCULAR | Status: AC
  Administered 2011-10-27: 15 mg via INTRAMUSCULAR
  Filled 2011-10-27: qty 1

## 2011-10-27 MED ORDER — SUMATRIPTAN SUCCINATE 25 MG PO TABS
25.0000 mg | ORAL_TABLET | Freq: Once | ORAL | Status: AC
  Administered 2011-10-27: 25 mg via ORAL
  Filled 2011-10-27 (×2): qty 1

## 2011-10-27 MED ORDER — KETOROLAC TROMETHAMINE 15 MG/ML IJ SOLN: 15.0000 mg | Freq: Once | INTRAMUSCULAR | Status: DC

## 2011-10-27 NOTE — Progress Notes (Signed)
[facility] Group Notes:  (Counselor/Nursing/MHT/Case Management/Adjunct)  10/27/2011   Type of Therapy:  Group Therapy at 1:15 to 2:30 PM  Participation Level:  Did Not Attend   Clide Dales 10/27/2011, 2:49 PM

## 2011-10-27 NOTE — Progress Notes (Signed)
Psychoeducational Group Note  Date:  10/27/2011 Time:  1100  Group Topic/Focus:  Coping With Mental Health Crisis:   The purpose of this group is to help patients identify strategies for coping with mental health crisis.  Group discusses possible causes of crisis and ways to manage them effectively.  Participation Level:  Active  Participation Quality:  Intrusive, Redirectable and Sharing  Affect:  Anxious and Irritable  Cognitive:  Appropriate  Insight:  Good  Engagement in Group:  Good  Additional Comments:  Pt was inappropriate and had to be redirected several times during group. Pt was very intrusive and stated that she would not get her workbook because she had completed it.    Sharyn Lull 10/27/2011, 1:00 PM

## 2011-10-27 NOTE — Progress Notes (Signed)
[facility] MD Progress Note  10/27/2011 12:04 PM Diagnosis:  Axis I: Alcohol/Substance Abuse/Dependencies            Axis II: deferred                      Axis III: Headache; Backpain; Sciatic Nerve Pain;Hx of Cluster headache with Migraines;                       Bulging disc L4/L5.  ADL's:  Intact  Sleep: Fair  Appetite:  Good  Suicidal Ideation:  Plan:  none Intent:  denies Means:  denies Homicidal Ideation:  Plan:  denies Intent:  denies Means:  denies  S:" Im upset bc the plan is to ween me off klonopin, but I need my Klonopin and I am not gonna stop it when I'm discharged.  I go crazy out of my mind. My live is hectic, I got to have klonopin to stabalize me. My life is hectic.  I have been on it since 1997."  Pt has multiple somatic complaints- Headache with nausea and accompanying light/smell/sound sensitivity.  Back pain  With left leg sciatica. Per pt she has hx of Cluster headache with migraines and bulging disk @ L4/L5.  O: Patient alert, anxious, but in no acute distress.  Noted to be squinting in interview room.    ROS: Neuro: + frontal Headache, + light sensitivity; Back pain; left leg sciatica Gastric: + nausea, - vomitting, - constipation, -diarrhea Resp: deferred All other systems deferred.  AEB (as evidenced by):  Mental Status Examination/Evaluation: Objective:  Appearance: Fairly Groomed  Eye Contact::  Good  Speech:  Clear and Coherent and Normal Rate  Volume:  Normal  Mood:  Anxious  Affect:  Depressed  Thought Process:  Goal Directed and Linear  Orientation:  Full  Thought Content:  WNL; Somatization  Suicidal Thoughts:  No  Homicidal Thoughts:  No  Memory:  Immediate;   Good  Judgement:  Impaired  Insight:  Lacking  Psychomotor Activity:  Normal  Concentration:  Fair  Recall:  Fair  Akathisia:  No  Handed:  Right  AIMS (if indicated):     Assets:  Social Support  Sleep:  Number of Hours: 6.75    Vital Signs:Blood pressure 105/69, pulse  73, temperature 97.5 F (36.4 C), temperature source Oral, resp. rate 18, height 5\' 6"  (1.676 m), weight 69.854 kg (154 lb). Current Medications: Current Facility-Administered Medications  Medication Dose Route Frequency Provider Last Rate Last Dose  . acetaminophen (TYLENOL) tablet 650 mg  650 mg Oral Q6H PRN Mickeal Skinner, MD   650 mg at 10/27/11 1610  . albuterol (PROVENTIL HFA;VENTOLIN HFA) 108 (90 BASE) MCG/ACT inhaler 2 puff  2 puff Inhalation Q6H PRN Mickeal Skinner, MD   2 puff at 10/25/11 0757  . alum & mag hydroxide-simeth (MAALOX/MYLANTA) 200-200-20 MG/5ML suspension 30 mL  30 mL Oral Q4H PRN Mickeal Skinner, MD      . citalopram (CELEXA) tablet 40 mg  40 mg Oral Daily Mickeal Skinner, MD   40 mg at 10/27/11 0810  . gabapentin (NEURONTIN) capsule 300 mg  300 mg Oral TID Mickeal Skinner, MD   300 mg at 10/27/11 1149  . hydrochlorothiazide (HYDRODIURIL) tablet 25 mg  25 mg Oral Daily Mickeal Skinner, MD   25 mg at 10/27/11 0811  . ketorolac (TORADOL) 30 MG/ML injection           . lamoTRIgine (LAMICTAL) tablet 100  mg  100 mg Oral Daily Mickeal Skinner, MD   100 mg at 10/27/11 0810  . loperamide (IMODIUM) capsule 2-4 mg  2-4 mg Oral PRN Verne Spurr, PA-C      . LORazepam (ATIVAN) tablet 1 mg  1 mg Oral BID Alyson Kuroski-Mazzei, DO   1 mg at 10/27/11 0811  . magnesium hydroxide (MILK OF MAGNESIA) suspension 30 mL  30 mL Oral Daily PRN Mickeal Skinner, MD   30 mL at 10/24/11 1704  . multivitamin with minerals tablet 1 tablet  1 tablet Oral Daily Mickeal Skinner, MD   1 tablet at 10/26/11 0759  . nicotine (NICODERM CQ - dosed in mg/24 hours) patch 21 mg  21 mg Transdermal Daily Mickie D. Adams, PA   21 mg at 10/27/11 0810  . ondansetron (ZOFRAN-ODT) disintegrating tablet 4 mg  4 mg Oral Q6H PRN Verne Spurr, PA-C   4 mg at 10/25/11 2042  . QUEtiapine (SEROQUEL) tablet 300 mg  300 mg Oral BID Alyson Kuroski-Mazzei, DO   300 mg at 10/27/11 1149  . SUMAtriptan (IMITREX) tablet 25 mg  25 mg Oral  Once [employer], DO   25 mg at 10/26/11 1418  . SUMAtriptan (IMITREX) tablet 25 mg  25 mg Oral Once Norval Gable, NP      . thiamine (VITAMIN B-1) tablet 100 mg  100 mg Oral Daily Mickeal Skinner, MD   100 mg at 10/26/11 0759  . traZODone (DESYREL) tablet 50 mg  50 mg Oral QHS PRN Mickeal Skinner, MD   50 mg at 10/26/11 2207  . verapamil (CALAN) tablet 40 mg  40 mg Oral QHS Mickeal Skinner, MD   40 mg at 10/25/11 2138  . DISCONTD: chlordiazePOXIDE (LIBRIUM) capsule 25 mg  25 mg Oral Q6H PRN Verne Spurr, PA-C      . DISCONTD: chlordiazePOXIDE (LIBRIUM) capsule 25 mg  25 mg Oral QID Verne Spurr, PA-C   25 mg at 10/25/11 1724  . DISCONTD: chlordiazePOXIDE (LIBRIUM) capsule 25 mg  25 mg Oral TID Verne Spurr, PA-C      . DISCONTD: chlordiazePOXIDE (LIBRIUM) capsule 25 mg  25 mg Oral BH-qamhs Verne Spurr, PA-C      . DISCONTD: chlordiazePOXIDE (LIBRIUM) capsule 25 mg  25 mg Oral Daily Verne Spurr, PA-C      . DISCONTD: hydrOXYzine (ATARAX/VISTARIL) tablet 50 mg  50 mg Oral TID PRN Verne Spurr, PA-C   50 mg at 10/26/11 0608  . DISCONTD: QUEtiapine (SEROQUEL) tablet 400 mg  400 mg Oral QHS Mickie D. Adams, PA   400 mg at 10/25/11 2138    Lab Results: None within the past 48 hours.  Physical Findings: AIMS: Facial and Oral Movements Muscles of Facial Expression: None, normal Lips and Perioral Area: None, normal Jaw: None, normal Tongue: None, normal,Extremity Movements Upper (arms, wrists, hands, fingers): None, normal Lower (legs, knees, ankles, toes): None, normal, Trunk Movements- none Neck, shoulders, hips: None, normal,  Overall Severity Severity of abnormal movements (highest score from questions above): None, normal Incapacitation due to abnormal movements: None, normal Patient's awareness of abnormal movements (rate only patient's report): N/A CIWA:  CIWA-Ar Total: 0  COWS:     Treatment Plan Summary: 1. Administer 25 mg imitrex tablet x 1 for c/o migraine  with aura. 2. Continue current plan of detox pt off of Klonopin 3. Reevaluate patient 10/28/11 for discharge 4. Continue encouraging group therapy. 5.Therapeutic Mileu and continuous assessment to evaluate symptoms and progress in treatment 6. Continue discharge planning.  1620pm: ROS: Neuro- Pt c/o unrelieved lower back pain with numbness & tingling left lower extremity. Pt states she is attempting to reduce wt bearing on left leg and shifting much of weight to right extremity during ambulation. Patient c/o pain 8/10, sharp intermittently.  Assessment: Neuro:Range of motion decreased to left lower extremity with difficulty abducting left leg. Pt observed decreasing weight  Bearing to left leg.  Treatment: Will administer 15 mg toradol IM X 1.  Norval Gable FNP-BC 10/27/2011, 12:04 PM

## 2011-10-27 NOTE — Progress Notes (Signed)
Read and reviewed. 

## 2011-10-27 NOTE — Discharge Planning (Signed)
Pt came to morning aftercare planning group. Wanted to d/c today. Pt said that she would call [facility] to reschedule appt and daughter has agreed to pick her up and let her stay at home until [facility] admission later in the month. Pt made aware that [facility] will not admit her if she is taking klonopin. Doctor not willing to d/c patient today due to librium taper.  Pt knew this yesterday, but did not mention in AM group.  Likely d/c Friday.

## 2011-10-27 NOTE — Progress Notes (Signed)
Patient ID: [name], female   DOB: 05-30-1965, 46 y.o.   MRN: [id number] She has been up and to groups interacting with peers and staff. She has c/o a headache and back pain today. Has had prn   For her headache and she stated that it had decreased to 5.  At this time she continues to c/o  Back pain . She did go lay down after  lunch and appears to be sleeping. NP stated that she would see her later for her back pain.

## 2011-10-28 DIAGNOSIS — F1210 Cannabis abuse, uncomplicated: Secondary | ICD-10-CM

## 2011-10-28 DIAGNOSIS — F319 Bipolar disorder, unspecified: Secondary | ICD-10-CM

## 2011-10-28 MED ORDER — ASPIRIN-ACETAMINOPHEN-CAFFEINE 250-250-65 MG PO TABS
2.0000 | ORAL_TABLET | Freq: Four times a day (QID) | ORAL | Status: DC | PRN
  Administered 2011-10-28: 2 via ORAL

## 2011-10-28 MED ORDER — CITALOPRAM HYDROBROMIDE 40 MG PO TABS: 40.0000 mg | ORAL_TABLET | Freq: Every day | ORAL | Status: DC

## 2011-10-28 MED ORDER — TRAZODONE HCL 50 MG PO TABS: 50.0000 mg | ORAL_TABLET | Freq: Every evening | ORAL | Status: DC | PRN

## 2011-10-28 MED ORDER — GABAPENTIN 300 MG PO CAPS: 300.0000 mg | ORAL_CAPSULE | Freq: Two times a day (BID) | ORAL | Status: DC

## 2011-10-28 MED ORDER — LAMOTRIGINE 100 MG PO TABS: 100.0000 mg | ORAL_TABLET | Freq: Every day | ORAL | Status: DC

## 2011-10-28 MED ORDER — QUETIAPINE FUMARATE 300 MG PO TABS: 300.0000 mg | ORAL_TABLET | Freq: Two times a day (BID) | ORAL | Status: AC

## 2011-10-28 MED ORDER — VERAPAMIL HCL 40 MG PO TABS: 40.0000 mg | ORAL_TABLET | Freq: Every day | ORAL | Status: DC

## 2011-10-28 MED ORDER — HYDROCHLOROTHIAZIDE 25 MG PO TABS: 25.0000 mg | ORAL_TABLET | Freq: Every day | ORAL | Status: DC

## 2011-10-28 NOTE — Progress Notes (Signed)
D:  Patient discharged to home.  Denies suicidal ideation or depressive symptoms.  Rates depression and hopelessness at a 1 on a scale of 0 to 10.  Continues to complain of headache and chronic pain in her back.  She was escorted out to the search room to retrieve her belongings and then to the front lobby.  She was given a bus pass and said that she would be catching the bus soon, but she wanted to charge her phone for a short time before leaving.  A:  Reviewed all discharge instructions, medications, and follow up care.  Patient was given prescriptions, but no sample of medications.   R:  Patient verbalized understanding of all discharge instructions.  When I read off the list of medications she was supposed to stop taking, she replied, "Oh my God, they have got to be out of their minds."  She states she will go back to taking Klonopin and all other prescribed medications.

## 2011-10-28 NOTE — Progress Notes (Signed)
Patient ID: [name], female   DOB: [birth year], 46 y.o.   MRN: [id number] D: Pt. In bed all evening, had to be awaken for meds. Pt. Irritable, forwards little. "feel all right" "ready to get outta here, go home." Pt. Reports she will have supports system at home. A: Writer introduced self to pt. Encouraged pt. To attend to attend group. Staff will monitor q20min for safety. R: Pt. Did not attend group. Meds given without incident. Pt. Is safe on the unit.

## 2011-10-28 NOTE — Progress Notes (Signed)
[facility] Case Management Discharge Plan:  Will you be returning to the same living situation after discharge: Yes,  yes At discharge, do you have transportation home?:Yes,  family or bus Do you have the ability to pay for your medications:Yes,  [facility]  Interagency Information:     Release of information consent forms completed and in the chart;  Patient's signature needed at discharge.  Patient to Follow up at:  Follow-up Information    Follow up with [facility]. On 11/01/2011. (12:00 They will pick you up at your daughter's house around 11:00)    Contact information:   [address]  [place]  [phone]  FAX: [phone]      Follow up with [facility]. (Call them to find out when you can start with groups)    Contact information:   [address]  [place]  [phone]         Patient denies SI/HI:   Yes,  yes    Safety Planning and Suicide Prevention discussed:  Yes,  yes  Barrier to discharge identified:No.  Summary and Recommendations:   [name] 10/28/2011, 10:53 AM

## 2011-10-28 NOTE — [facility] Suicide Risk Assessment (Signed)
Suicide Risk Assessment  Discharge Assessment      Demographic factors:  Caucasian;Low socioeconomic status;Unemployed  Pt denied any SI/HI/thoughts of self harm or acute psychiatric issues in treatment team with clinical, nursing and medical team present.  Current Mental Status by Physician: Patient seen and evaluated. Chart reviewed. Patient stated that her mood was "good". Her affect was mood congruent and euthymic.  Less pressurred on increased dose or Seroquel. She denied any current thoughts of self injurious behavior, suicidal ideation or homicidal ideation. Has been less agitated on unit with current meds. There were no auditory or visual hallucinations, paranoia, delusional thought processes, or mania noted.  Thought process was linear and goal directed.  No psychomotor agitation or retardation was noted. Speech was normal rate, tone and volume. Eye contact was good. Judgment and insight are limited.  Pt may return to clonazepam use s/p discharge even against team's advice and taper off of the medication.  Pt very unwilling to "give that up" in light of her "hectic" life. Patient has been up and limitedly engaged on the unit.  No acute safety concerns reported from team.    Loss Factors:   unable to function well, disabled  Historical Factors:   15 SI attempts in past  Risk Reduction Factors:  Living with another person, especially a relative;Positive therapeutic relationship; willing to f/u with [facility] and [facility]  Discharge Diagnoses: Benzodiazepine W/D Disorder, resolved; Alcohol Use Disorder; Cannabis Use Disorder; BPAD; HAs; Migraines, per Hx   Past Medical History  Diagnosis Date  . Bipolar 1 disorder   . Schizophrenia   . Anxiety   . Fibromyalgia   . GERD (gastroesophageal reflux disease)   . Hypertension   . Migraine     Cognitive Features That Contribute To Risk: limited insight; impulsivity.  VS:  Filed Vitals:   10/28/11 0601  BP: 105/75  Pulse: 71    Temp:   Resp:     Suicide Risk: Pt viewed as a chronic increased risk of harm to self in light of her past hx and risk factors.  No acute safety concerns on the unit.  Pt contracting for safety and is stable for discharge.  Plan Of Care/Follow-up recommendations: Pt seen and evaluated in treatment team. Chart reviewed.  Pt stable for and requesting discharge home. Pt contracting for safety and does not currently meet [place] involuntary commitment criteria for continued hospitalization against her will.  Mental health treatment, medication management and continued sobriety will mitigate against the potential increased risk of harm to self and others.  Discussed the importance of recovery further with pt, as well as, tools to move forward in a healthy & safe manner. Discussed with the team.  Please see orders, follow up appointments per AVS ([facility] and [facility]) and full discharge summary.  Recommend follow up with AA.  Diet: Heart Healthy.  Activity: As tolerated.     Lupe Carney 10/28/2011, 12:04 PM

## 2011-10-28 NOTE — Progress Notes (Signed)
[facility] Case Management Discharge Plan:  Will you be returning to the same living situation after discharge: Yes,  lives with daughter/grandchildren At discharge, do you have transportation home?:Yes,  daughter Do you have the ability to pay for your medications:Yes,  medicare  Release of information consent forms completed and in the chart;  Patient's signature needed at discharge.  Patient to Follow up at:  Follow-up Information    Follow up with [facility]. On 11/01/2011. (12:00 They will pick you up at your daughter's house around 11:00)    Contact information:   [address]  [place]  [phone]  FAX: [phone]      Follow up with [facility]. (Call them to find out when you can start with groups)    Contact information:   [address]  [place]  [phone]         Patient denies SI/HI:   Yes,  yes    Safety Planning and Suicide Prevention discussed:  Yes,  yes  Barrier to discharge identified:No.  Summary and Recommendations:   [name], [name] 10/28/2011, 12:22 PM

## 2011-10-29 NOTE — Progress Notes (Signed)
Patient Discharge Instructions:  After Visit Summary (AVS):   Faxed to:  10/29/2011 Psychiatric Admission Assessment Note:   Faxed to:  10/29/2011 Suicide Risk Assessment - Discharge Assessment:   Faxed to:  10/29/2011 Faxed/Sent to the Next Level Care provider:  10/29/2011  Faxed to [facility] @ [phone]  Wandra Scot, 10/29/2011, 4:28 PM

## 2011-11-05 ENCOUNTER — Ambulatory Visit
Admission: RE | Admit: 2011-11-05 | Discharge: 2011-11-05 | Disposition: A | Discharge status: Home / Self Care | Payer: Medicare Other | Source: Ambulatory Visit | Attending: Specialist | Admitting: Specialist

## 2011-11-05 ENCOUNTER — Other Ambulatory Visit: Payer: Self-pay | Admitting: Specialist

## 2011-11-05 DIAGNOSIS — W19XXXA Unspecified fall, initial encounter: Secondary | ICD-10-CM

## 2011-11-05 DIAGNOSIS — M79672 Pain in left foot: Secondary | ICD-10-CM

## 2011-11-06 ENCOUNTER — Emergency Department (HOSPITAL_COMMUNITY)
Admission: EM | Admit: 2011-11-06 | Discharge: 2011-11-06 | Disposition: A | Discharge status: Home / Self Care | Payer: Medicare Other | Attending: Emergency Medicine | Admitting: Emergency Medicine

## 2011-11-06 ENCOUNTER — Encounter (HOSPITAL_COMMUNITY): Payer: Self-pay | Admitting: Physical Medicine and Rehabilitation

## 2011-11-06 ENCOUNTER — Emergency Department (HOSPITAL_COMMUNITY): Payer: Medicare Other

## 2011-11-06 DIAGNOSIS — K219 Gastro-esophageal reflux disease without esophagitis: Secondary | ICD-10-CM | POA: Insufficient documentation

## 2011-11-06 DIAGNOSIS — Y9289 Other specified places as the place of occurrence of the external cause: Secondary | ICD-10-CM | POA: Insufficient documentation

## 2011-11-06 DIAGNOSIS — T148XXA Other injury of unspecified body region, initial encounter: Secondary | ICD-10-CM

## 2011-11-06 DIAGNOSIS — I10 Essential (primary) hypertension: Secondary | ICD-10-CM | POA: Insufficient documentation

## 2011-11-06 DIAGNOSIS — S92912A Unspecified fracture of left toe(s), initial encounter for closed fracture: Secondary | ICD-10-CM

## 2011-11-06 DIAGNOSIS — W108XXA Fall (on) (from) other stairs and steps, initial encounter: Secondary | ICD-10-CM | POA: Insufficient documentation

## 2011-11-06 DIAGNOSIS — S92919A Unspecified fracture of unspecified toe(s), initial encounter for closed fracture: Secondary | ICD-10-CM | POA: Insufficient documentation

## 2011-11-06 DIAGNOSIS — F17200 Nicotine dependence, unspecified, uncomplicated: Secondary | ICD-10-CM | POA: Insufficient documentation

## 2011-11-06 DIAGNOSIS — F319 Bipolar disorder, unspecified: Secondary | ICD-10-CM | POA: Insufficient documentation

## 2011-11-06 DIAGNOSIS — F209 Schizophrenia, unspecified: Secondary | ICD-10-CM | POA: Insufficient documentation

## 2011-11-06 MED ORDER — TRAMADOL HCL 50 MG PO TABS
50.0000 mg | ORAL_TABLET | Freq: Once | ORAL | Status: AC
  Administered 2011-11-06: 50 mg via ORAL
  Filled 2011-11-06: qty 1

## 2011-11-06 MED ORDER — IBUPROFEN 400 MG PO TABS
800.0000 mg | ORAL_TABLET | Freq: Once | ORAL | Status: AC
  Administered 2011-11-06: 800 mg via ORAL
  Filled 2011-11-06: qty 2

## 2011-11-06 MED ORDER — TRAMADOL HCL 50 MG PO TABS: 50.0000 mg | ORAL_TABLET | Freq: Four times a day (QID) | ORAL | Status: DC | PRN

## 2011-11-06 MED ORDER — IBUPROFEN 800 MG PO TABS: 800.0000 mg | ORAL_TABLET | Freq: Three times a day (TID) | ORAL | Status: DC | PRN

## 2011-11-06 NOTE — ED Notes (Signed)
Pt refused ice to area.

## 2011-11-06 NOTE — ED Notes (Signed)
Pt presents to department for evaluation of L foot swelling and 4th toe pain. States she fell getting off of schoolbus. Now states increased pain and swelling. 8/10 pain at the time, becomes worse with movement. She is alert and oriented x4. No signs of distress noted.

## 2011-11-06 NOTE — ED Provider Notes (Signed)
History  This chart was scribed for Cyndra Numbers, MD by Shari Heritage. The patient was seen in room TR10C/TR10C. Patient's care was started at 1215.     CSN: [id number]  Arrival date & time 11/06/11  1122   First MD Initiated Contact with Patient 11/06/11 1215      Chief Complaint  Patient presents with  . Toe Pain   The history is provided by the patient. No language interpreter was used.    ERVA KOKE is a 46 y.o. female who presents to the Emergency Department complaining of moderate to severe, constant, non-radiating left foot pain and swelling; and mild to moderate back pain resulting from a fall that occurred yesterday. Patient states that she missed 3 steps and fell while getting off a bus yesterday. Patient states that she went to her PCP's office yesterday, but he did not provide any narcotic pain medication. She did have an X-ray of her foot taken at [facility]. Patient says that she has taken Goody's Powder and Percogesic with mild pain relief. She has not applied ice to the area. Patient has a history of chronic lower back pain with bulging disc, bipolar 1 disorder, schizophrenia, anxiety, fibromyalgia, GERD, HTN and migraine. She is a current every day smoker. Patient states that her daughter drove her to the ED.  Past Medical History  Diagnosis Date  . Bipolar 1 disorder   . Schizophrenia   . Anxiety   . Fibromyalgia   . GERD (gastroesophageal reflux disease)   . Hypertension   . Migraine     Past Surgical History  Procedure Date  . Cesarean section     No family history on file.  History  Substance Use Topics  . Smoking status: Current Every Day Smoker -- 2.0 packs/day for 15 years    Types: Cigarettes  . Smokeless tobacco: Never Used  . Alcohol Use: Yes    OB History    Grav Para Term Preterm Abortions TAB SAB Ect Mult Living                  Review of Systems  Constitutional: Negative.   HENT: Negative.   Eyes: Negative.     Respiratory: Negative.   Cardiovascular: Negative.   Gastrointestinal: Negative.   Genitourinary: Negative.   Musculoskeletal: Positive for myalgias.  Skin: Negative.   Neurological: Negative.   Hematological: Negative.   Psychiatric/Behavioral: Negative.     Allergies  Prednisone; Nsaids; and Sulfa antibiotics  Home Medications   Current Outpatient Rx  Name Route Sig Dispense Refill  . ALBUTEROL SULFATE HFA 108 (90 BASE) MCG/ACT IN AERS Inhalation Inhale 2 puffs into the lungs every 6 (six) hours as needed. For shortness of breath    . CITALOPRAM HYDROBROMIDE 40 MG PO TABS Oral Take 1 tablet (40 mg total) by mouth daily. For depression/anxiety 30 tablet 0  . GABAPENTIN 300 MG PO CAPS Oral Take 1 capsule (300 mg total) by mouth 2 (two) times daily. At noon and 4pm for pain. 60 capsule 0  . HYDROCHLOROTHIAZIDE 25 MG PO TABS Oral Take 1 tablet (25 mg total) by mouth daily. For HTN 30 tablet 0  . LAMOTRIGINE 100 MG PO TABS Oral Take 1 tablet (100 mg total) by mouth daily. For mood stability. 30 tablet 0  . QUETIAPINE FUMARATE 300 MG PO TABS Oral Take 1 tablet (300 mg total) by mouth 2 (two) times daily. For mood stability 60 tablet 0  . TRAZODONE HCL 50 MG PO  TABS Oral Take 1 tablet (50 mg total) by mouth at bedtime as needed for sleep. 30 tablet 0    BP 108/65  Pulse 73  Temp 98.2 F (36.8 C) (Oral)  Resp 18  Ht 5\' 6"  (1.676 m)  Wt 157 lb (71.215 kg)  BMI 25.34 kg/m2  SpO2 97%  Physical Exam GEN: Well-developed, well-nourished female in no acute distress HEENT: Atraumatic, normocephalic. Oropharynx clear without erythema CV: regular rate and rhythm. PULM: No respiratory distress.  GU: deferred Neuro: cranial nerves grossly 2-12 intact, no abnormalities of strength or sensation, A and O x 3 MSK: Swollen left 4th toe with slight ecchymosis and swelling. No deformity. Otherwise normal. Skin: No rashes petechiae, purpura, or jaundice. 4x4 in bruise to her left buttock.   Psych: no abnormality of mood    ED Course  Procedures (including critical care time) DIAGNOSTIC STUDIES: Oxygen Saturation is 97% on room air, adequate by my interpretation.    COORDINATION OF CARE: 12:42pm- Patient informed of current plan for treatment and evaluation and agrees with plan at this time.    Labs Reviewed - No data to display  Dg Foot Complete Left  11/05/2011  *RADIOLOGY REPORT*  Clinical Data: Larey Seat 4 days ago with pain  LEFT FOOT - COMPLETE 3+ VIEW  Comparison: None.  Findings: The only area that appears questionable for possible fracture is the distal aspect of the proximal phalanx of the left fourth toe.  There is very slight cortical irregularity and a nondisplaced fracture at that site cannot be excluded.  Clinical correlation is recommended.  There is mild degenerative change at the left first MTP joint.  A bony density just above the talar beak appears well corticated and most likely old.  A small plantar calcaneal degenerative spur is noted.  IMPRESSION: Cannot exclude subtle fracture of the distal aspect of the proximal phalanx of the left fourth toe.  Correlate clinically.   Original Report Authenticated By: Juline Patch, M.D.      1. Fracture of toe of left foot   2. Hematoma       MDM  Patient was evaluated by myself. Plain film of the left foot was performed and reviewed by myself no evidence of fracture. Patient also had some ecchymosis on the buttock from her fall down some steps. Patient was neurologically intact with no incontinence or new numbness, tingling, or paresthesias. Patient had actually seen her primary care physician yesterday. She reports that this individual is out of town. Patient was not placed on pain medications at that time for this injury. Patient has minor injury and we discussed that I did not feel that narcotic pain medications are indicated. She was given prescription for ibuprofen tramadol. Patient also was provided with a postop  boot for comfort as well as instructions to use ice for her symptoms. Patient had no findings to suggest need for further imaging today. She was discharged in good condition.      I personally performed the services described in this documentation, which was scribed in my presence. The recorded information has been reviewed and considered.      Cyndra Numbers, MD 11/07/11 1108

## 2011-11-06 NOTE — ED Notes (Signed)
Patient C/O pain in her left fourth toe. Also C/O pain in her low back. States that she slipped getting off of the bus. Left 4th toe is noted to be bruised and swollen.  Movement is painful.  Low back pain is mainly on her left flank and mid back. Back pain shoots down her left leg.  A large bruise is noted on her left buttock cheek and in her right, medial, upper arm.

## 2011-12-06 NOTE — Discharge Summary (Signed)
Physician Discharge Summary Note  Patient:  [name] is an 46 y.o., female MRN:  [id number] DOB:  [birth year] Patient phone:  [phone] (home)  Patient address:   [address] [place] [address],   Date of Admission:  10/22/2011 Date of Discharge: 10/28/11  Reason for Admission: see H&P.  Principal Problem:  *Bipolar affective disorder Active Problems:  Alcohol abuse  Cannabis abuse with cannabis-induced disorder  History of cluster headache  Migraine headache with aura  Lumbago with sciatica of left side  Discharge Diagnoses: Benzodiazepine W/D Disorder, resolved; Alcohol Use Disorder; Cannabis Use Disorder; BPAD; HAs; Migraines, per Hx   Past Medical History   Diagnosis  Date   .  Bipolar 1 disorder    .  Schizophrenia    .  Anxiety    .  Fibromyalgia    .  GERD (gastroesophageal reflux disease)    .  Hypertension    .  Migraine     Level of Care:  OP  Hospital Course:  Pt admitted for crisis stabilization, detox and treatment. Pt attended all therapeutic groups, was active in her treatment planning process and agreed to her current medication regimen for further stability during recovery.  There were no acute issues during treatment and she was open to further residential substance abuse Tx.  All labs were reviewed with her in great detail and medical needs were addressed.  No acute safety issues were noted on the unit. Medications were reviewed with pt and medication education was provided. Mental health treatment, medication management and continued sobriety will mitigate against any increased risk of harm to self and/or others.  Discussed the importance of recovery with pt, as well as, tools to move forward in a healthy & safe manner using the 12 Step Process.    Consults:  None  Significant Diagnostic Studies:  labs: see labs.  Discharge Vitals:   Blood pressure 105/75, pulse 71, temperature 97.4 F (36.3 C), temperature source Oral, resp. rate 18,  height 5\' 6"  (1.676 m), weight 69.854 kg (154 lb).  Physical Findings: AIMS: Facial and Oral Movements Muscles of Facial Expression: None, normal Lips and Perioral Area: None, normal Jaw: None, normal Tongue: None, normal,Extremity Movements Upper (arms, wrists, hands, fingers): None, normal Lower (legs, knees, ankles, toes): None, normal, Trunk Movements Neck, shoulders, hips: None, normal, Overall Severity Severity of abnormal movements (highest score from questions above): None, normal Incapacitation due to abnormal movements: None, normal Patient's awareness of abnormal movements (rate only patient's report): No Awareness, Dental Status Current problems with teeth and/or dentures?: No Does patient usually wear dentures?: No   Mental Status Exam: See Mental Status Examination and Suicide Risk Assessment completed by Attending Physician prior to discharge.  Discharge destination:  Home  Is patient on multiple antipsychotic therapies at discharge:  No   Has Patient had three or more failed trials of antipsychotic monotherapy by history:  No  Recommended Plan for Multiple Antipsychotic Therapies: NA  Discharge Orders    Future Orders Please Complete By Expires   Diet - low sodium heart healthy          Medication List     As of 12/06/2011 10:18 PM    STOP taking these medications         butalbital-acetaminophen-caffeine 50-325-40 MG per tablet   Commonly known as: FIORICET, ESGIC      clonazePAM 1 MG tablet   Commonly known as: KLONOPIN      GOODY HEADACHE PO  HYDROcodone-acetaminophen 10-650 MG per tablet   Commonly known as: LORCET      promethazine 25 MG suppository   Commonly known as: PHENERGAN      QUEtiapine 200 MG tablet   Commonly known as: SEROQUEL      verapamil 40 MG tablet   Commonly known as: CALAN      zolpidem 5 MG tablet   Commonly known as: AMBIEN      TAKE these medications      Indication    albuterol 108 (90 BASE) MCG/ACT  inhaler   Commonly known as: PROVENTIL HFA;VENTOLIN HFA   Inhale 2 puffs into the lungs every 6 (six) hours as needed. For shortness of breath       citalopram 40 MG tablet   Commonly known as: CELEXA   Take 1 tablet (40 mg total) by mouth daily. For depression/anxiety       gabapentin 300 MG capsule   Commonly known as: NEURONTIN   Take 1 capsule (300 mg total) by mouth 2 (two) times daily. At noon and 4pm for pain.       hydrochlorothiazide 25 MG tablet   Commonly known as: HYDRODIURIL   Take 1 tablet (25 mg total) by mouth daily. For HTN       lamoTRIgine 100 MG tablet   Commonly known as: LAMICTAL   Take 1 tablet (100 mg total) by mouth daily. For mood stability.       traZODone 50 MG tablet   Commonly known as: DESYREL   Take 1 tablet (50 mg total) by mouth at bedtime as needed for sleep.            Follow-up Information    [facility] with [facility]. On 11/01/2011. (12:00 They will pick you up at your daughter's house around 11:00)    Contact information:   [address]  [place]  [phone]  FAX: [phone]      Follow up with [facility]. (Call them to find out when you can start with groups)    Contact information:   [address]  [place]  [[phone]] [phone]        Suicide Risk: Pt viewed as a chronic increased risk of harm to self in light of her past hx and risk factors. No acute safety concerns on the unit. Pt contracting for safety and is stable for discharge.   Plan Of Care/Follow-up recommendations: Pt seen and evaluated in treatment team. Chart reviewed. Pt stable for and requesting discharge home. Pt contracting for safety and does not currently meet [place] involuntary commitment criteria for continued hospitalization against her will. Mental health treatment, medication management and continued sobriety will mitigate against the potential increased risk of harm to self and others. Discussed the importance of recovery further with pt, as well as,  tools to move forward in a healthy & safe manner. Recommend follow up with AA. Diet: Heart Healthy. Activity: As tolerated.    Signed: Lupe Carney 12/06/2011, 10:18 PM

## 2012-01-06 ENCOUNTER — Emergency Department (HOSPITAL_COMMUNITY)
Admission: EM | Admit: 2012-01-06 | Discharge: 2012-01-06 | Disposition: A | Discharge status: Home / Self Care | Payer: Medicare Other | Attending: Emergency Medicine | Admitting: Emergency Medicine

## 2012-01-06 ENCOUNTER — Encounter (HOSPITAL_COMMUNITY): Payer: Self-pay | Admitting: Physical Medicine and Rehabilitation

## 2012-01-06 DIAGNOSIS — IMO0001 Reserved for inherently not codable concepts without codable children: Secondary | ICD-10-CM | POA: Insufficient documentation

## 2012-01-06 DIAGNOSIS — I10 Essential (primary) hypertension: Secondary | ICD-10-CM | POA: Insufficient documentation

## 2012-01-06 DIAGNOSIS — F209 Schizophrenia, unspecified: Secondary | ICD-10-CM | POA: Insufficient documentation

## 2012-01-06 DIAGNOSIS — F411 Generalized anxiety disorder: Secondary | ICD-10-CM | POA: Insufficient documentation

## 2012-01-06 DIAGNOSIS — F17200 Nicotine dependence, unspecified, uncomplicated: Secondary | ICD-10-CM | POA: Insufficient documentation

## 2012-01-06 DIAGNOSIS — F319 Bipolar disorder, unspecified: Secondary | ICD-10-CM | POA: Insufficient documentation

## 2012-01-06 DIAGNOSIS — G43909 Migraine, unspecified, not intractable, without status migrainosus: Secondary | ICD-10-CM

## 2012-01-06 DIAGNOSIS — Z79899 Other long term (current) drug therapy: Secondary | ICD-10-CM | POA: Insufficient documentation

## 2012-01-06 DIAGNOSIS — R110 Nausea: Secondary | ICD-10-CM | POA: Insufficient documentation

## 2012-01-06 DIAGNOSIS — Z8739 Personal history of other diseases of the musculoskeletal system and connective tissue: Secondary | ICD-10-CM | POA: Insufficient documentation

## 2012-01-06 DIAGNOSIS — H53149 Visual discomfort, unspecified: Secondary | ICD-10-CM | POA: Insufficient documentation

## 2012-01-06 DIAGNOSIS — K219 Gastro-esophageal reflux disease without esophagitis: Secondary | ICD-10-CM | POA: Insufficient documentation

## 2012-01-06 MED ORDER — METOCLOPRAMIDE HCL 5 MG/ML IJ SOLN
10.0000 mg | Freq: Once | INTRAMUSCULAR | Status: AC
  Administered 2012-01-06: 10 mg via INTRAMUSCULAR
  Filled 2012-01-06: qty 2

## 2012-01-06 MED ORDER — HYDROCODONE-ACETAMINOPHEN 5-325 MG PO TABS: 2.0000 | ORAL_TABLET | Freq: Once | ORAL | Status: DC

## 2012-01-06 MED ORDER — HYDROCODONE-ACETAMINOPHEN 5-325 MG PO TABS
2.0000 | ORAL_TABLET | Freq: Once | ORAL | Status: AC
  Administered 2012-01-06: 2 via ORAL
  Filled 2012-01-06: qty 2

## 2012-01-06 MED ORDER — BUTALBITAL-APAP-CAFFEINE 50-325-40 MG PO TABS: 1.0000 | ORAL_TABLET | Freq: Four times a day (QID) | ORAL | Status: DC | PRN

## 2012-01-06 MED ORDER — DIPHENHYDRAMINE HCL 50 MG/ML IJ SOLN
25.0000 mg | Freq: Once | INTRAMUSCULAR | Status: AC
  Administered 2012-01-06: 25 mg via INTRAMUSCULAR
  Filled 2012-01-06: qty 1

## 2012-01-06 NOTE — ED Notes (Signed)
Patient is also complaining of blurred vision/double vision onset 2 days ago that last approx 1 hour.  She has clear speech.  No noted weakness in her arms or legs.  She has hx of cva,  Left sided weakness

## 2012-01-06 NOTE — ED Provider Notes (Signed)
Medical screening examination/treatment/procedure(s) were performed by non-physician practitioner and as supervising physician I was immediately available for consultation/collaboration.   Edker Punt, MD 01/06/12 1701 

## 2012-01-06 NOTE — ED Notes (Signed)
Pt presents to department for evaluation of headache. Onset yesterday. States, blurred vision, photosensitivity and nausea. History of migraine headaches. 10/10 "throbbing" headache upon arrival. Pt is conscious alert and oriented x4.

## 2012-01-06 NOTE — ED Notes (Signed)
Pt states she is "having a migraine that's been going on a couple of days" and her meds aren't providing relief. Also has herniated disk on left side that "protrudes out and it's also been bothering me a lot." States she has been dx cluster migraines. States she has at home PO med for nausea and is nauseous right now with the migraine. States "when someone talks to me it's like their voice is in my head and it's screaming." Also states she had to leave church one day because of a migraine. "They're trying a headache awareness center to go to." Sees someone at [facility].

## 2012-01-06 NOTE — ED Provider Notes (Signed)
History     CSN: [id number]  Arrival date & time 01/06/12  1216   First MD Initiated Contact with Patient 01/06/12 1425      Chief Complaint  Patient presents with  . Headache    (Consider location/radiation/quality/duration/timing/severity/associated sxs/prior treatment) The history is provided by the patient and medical records.   [name] is a 46 y.o. female presents to the emergency department complaining of headache. Patient has a history of migraine headaches and cluster headaches. She states her cluster headaches are always on one side of her face and her migraine headaches are more generalized. She states this headache is much more like a generalized migraine headache. She takes Fioricet for her migraine headaches, the last time she took one was yesterday with only minor relief. She states the headache began 2 days ago, began gradually, has been persistent and gradually worsening.   Patient states she has associated nausea, photosensitivity, sound sensitivity and blurry vision. Sounds and light makes the headache worse, the Fioricet sometimes makes the headache better.  She states she does not normally have blurry vision with her migraines. She is a history of CVA with minor left-sided deficit. Chest reports a one hour episode of double vision which occurred 2 days ago resolved spontaneously. She denies any weakness, numbness, tingling, gait disturbance, syncope.   Patient also denies fever, chills, chest pain, shortness of breath, abdominal pain, vomiting, diarrhea.  Patient sees a physician at [facility] and neurologic for migraine and cluster headache management.  Past Medical History  Diagnosis Date  . Bipolar 1 disorder   . Schizophrenia   . Anxiety   . Fibromyalgia   . GERD (gastroesophageal reflux disease)   . Hypertension   . Migraine     Past Surgical History  Procedure Date  . Cesarean section     No family history on file.  History  Substance Use  Topics  . Smoking status: Current Every Day Smoker -- 2.0 packs/day for 15 years    Types: Cigarettes  . Smokeless tobacco: Never Used  . Alcohol Use: Yes    OB History    Grav Para Term Preterm Abortions TAB SAB Ect Mult Living                  Review of Systems  Constitutional: Negative for fever, diaphoresis, appetite change, fatigue and unexpected weight change.  HENT: Negative for ear pain, congestion, mouth sores, neck pain, neck stiffness and postnasal drip.   Eyes: Positive for photophobia and visual disturbance.  Respiratory: Negative for cough, chest tightness, shortness of breath and wheezing.   Cardiovascular: Negative for chest pain.  Gastrointestinal: Positive for nausea. Negative for vomiting, abdominal pain, diarrhea and constipation.  Genitourinary: Negative for dysuria, urgency, frequency and hematuria.  Skin: Negative for rash.  Neurological: Positive for headaches. Negative for dizziness, seizures, syncope, facial asymmetry, speech difficulty, weakness, light-headedness and numbness.  Psychiatric/Behavioral: Negative for sleep disturbance. The patient is not nervous/anxious.   All other systems reviewed and are negative.    Allergies  Prednisone; Claritin; Nsaids; and Sulfa antibiotics  Home Medications   Current Outpatient Rx  Name  Route  Sig  Dispense  Refill  . BUTALBITAL-APAP-CAFF-COD 50-325-40-30 MG PO CAPS   Oral   Take 1 capsule by mouth every 4 (four) hours as needed.         Marland Kitchen CLONAZEPAM 1 MG PO TABS   Oral   Take 1 mg by mouth daily.         Marland Kitchen  GABAPENTIN 300 MG PO CAPS   Oral   Take 1 capsule (300 mg total) by mouth 2 (two) times daily. At noon and 4pm for pain.   60 capsule   0   . HYDROCHLOROTHIAZIDE 25 MG PO TABS   Oral   Take 1 tablet (25 mg total) by mouth daily. For HTN   30 tablet   0   . LAMOTRIGINE 100 MG PO TABS   Oral   Take 1 tablet (100 mg total) by mouth daily. For mood stability.   30 tablet   0   .  PAROXETINE HCL 20 MG PO TABS   Oral   Take 20 mg by mouth daily.         Marland Kitchen BUTALBITAL-APAP-CAFFEINE 50-325-40 MG PO TABS   Oral   Take 1-2 tablets by mouth every 6 (six) hours as needed for headache.   20 tablet   0     BP 121/65  Pulse 75  Temp 98.3 F (36.8 C) (Oral)  Resp 16  SpO2 98%  Physical Exam  Nursing note and vitals reviewed. Constitutional: She is oriented to person, place, and time. She appears well-developed and well-nourished. No distress.  HENT:  Head: Normocephalic and atraumatic.  Right Ear: Tympanic membrane, external ear and ear canal normal.  Left Ear: Tympanic membrane, external ear and ear canal normal.  Nose: Nose normal.  Mouth/Throat: Uvula is midline, oropharynx is clear and moist and mucous membranes are normal. No uvula swelling. No oropharyngeal exudate.  Eyes: Conjunctivae normal and EOM are normal. Pupils are equal, round, and reactive to light. No scleral icterus.  Neck: Normal range of motion. Neck supple.  Cardiovascular: Normal rate, regular rhythm, normal heart sounds and intact distal pulses.  Exam reveals no gallop and no friction rub.   No murmur heard. Pulmonary/Chest: Effort normal and breath sounds normal. No respiratory distress. She has no wheezes. She exhibits no tenderness.  Abdominal: Soft. Bowel sounds are normal. She exhibits no distension and no mass. There is no tenderness. There is no rebound and no guarding.  Musculoskeletal: Normal range of motion. She exhibits no edema and no tenderness.  Lymphadenopathy:    She has no cervical adenopathy.  Neurological: She is alert and oriented to person, place, and time. She has normal reflexes. No cranial nerve deficit. She exhibits normal muscle tone. Coordination normal.       Speech is clear and goal oriented, follows commands Major Cranial nerves without deficit, no facial droop Normal strength in upper and lower extremities bilaterally including dorsiflexion and plantar  flexion, strong and equal grip strength Sensation normal to light and sharp touch Moves extremities without ataxia, coordination intact Normal finger to nose and rapid alternating movements Neg romberg, no pronator drift Normal gait and balance  Skin: Skin is warm and dry. She is not diaphoretic.  Psychiatric: She has a normal mood and affect.    ED Course  Procedures (including critical care time)  Labs Reviewed - No data to display No results found.  Visual Acuity: Both eyes: 20/25  Right eye: 20/30  Left eye: 20/40   1. Migraine headache       MDM  DAWNN NAM presents with headache.   Patient is alert, oriented x4, NAD, nontoxic and nonseptic appearing, neurologically intact without deficits. Pt HA treated and improved while in ED.  Presentation is like pts typical HA and non concerning for [facility], ICH, Meningitis, or temporal arteritis. Pt is afebrile with no focal neuro  deficits, nuchal rigidity. Patient visual deficit resolved after her headache treatment.  Visual acuity screening normal for patient.  Patient in your legs without difficulty, states she feels much better and is ready to go home.  I discussed further treatment at home for her migraine. We'll discharge home with a refill of her Fioricet.  I have also discussed reasons to return immediately to the ER.  Patient expresses understanding and agrees with plan.  1. Medications: Fioricet 2. Treatment: Rest, drink plenty of fluids, Tylenol for additional pain relief 3. Follow Up: With your neurologist about today's visit         Dierdre Forth, PA-C 01/06/12 1658

## 2012-01-06 NOTE — ED Notes (Signed)
Both eyes: 20/25 Right eye: 20/30 Left eye: 20/40

## 2012-01-07 ENCOUNTER — Emergency Department (HOSPITAL_COMMUNITY)
Admission: EM | Admit: 2012-01-07 | Discharge: 2012-01-07 | Disposition: A | Discharge status: Home / Self Care | Payer: Medicare Other | Attending: Emergency Medicine | Admitting: Emergency Medicine

## 2012-01-07 ENCOUNTER — Encounter (HOSPITAL_COMMUNITY): Payer: Self-pay | Admitting: Emergency Medicine

## 2012-01-07 DIAGNOSIS — F209 Schizophrenia, unspecified: Secondary | ICD-10-CM | POA: Insufficient documentation

## 2012-01-07 DIAGNOSIS — Z8739 Personal history of other diseases of the musculoskeletal system and connective tissue: Secondary | ICD-10-CM | POA: Insufficient documentation

## 2012-01-07 DIAGNOSIS — Z8669 Personal history of other diseases of the nervous system and sense organs: Secondary | ICD-10-CM | POA: Insufficient documentation

## 2012-01-07 DIAGNOSIS — F17200 Nicotine dependence, unspecified, uncomplicated: Secondary | ICD-10-CM | POA: Insufficient documentation

## 2012-01-07 DIAGNOSIS — Z79899 Other long term (current) drug therapy: Secondary | ICD-10-CM | POA: Insufficient documentation

## 2012-01-07 DIAGNOSIS — F319 Bipolar disorder, unspecified: Secondary | ICD-10-CM | POA: Insufficient documentation

## 2012-01-07 DIAGNOSIS — I10 Essential (primary) hypertension: Secondary | ICD-10-CM | POA: Insufficient documentation

## 2012-01-07 DIAGNOSIS — Z8719 Personal history of other diseases of the digestive system: Secondary | ICD-10-CM | POA: Insufficient documentation

## 2012-01-07 DIAGNOSIS — G43909 Migraine, unspecified, not intractable, without status migrainosus: Secondary | ICD-10-CM | POA: Insufficient documentation

## 2012-01-07 DIAGNOSIS — H53149 Visual discomfort, unspecified: Secondary | ICD-10-CM | POA: Insufficient documentation

## 2012-01-07 DIAGNOSIS — F411 Generalized anxiety disorder: Secondary | ICD-10-CM | POA: Insufficient documentation

## 2012-01-07 MED ORDER — TRAMADOL HCL 50 MG PO TABS: 50.0000 mg | ORAL_TABLET | Freq: Once | ORAL | Status: DC

## 2012-01-07 MED ORDER — DIPHENHYDRAMINE HCL 25 MG PO CAPS
25.0000 mg | ORAL_CAPSULE | Freq: Once | ORAL | Status: AC
  Administered 2012-01-07: 25 mg via ORAL
  Filled 2012-01-07: qty 1

## 2012-01-07 MED ORDER — ONDANSETRON 4 MG PO TBDP
4.0000 mg | ORAL_TABLET | Freq: Once | ORAL | Status: DC
  Filled 2012-01-07: qty 1

## 2012-01-07 MED ORDER — TRAMADOL HCL 50 MG PO TABS
50.0000 mg | ORAL_TABLET | Freq: Once | ORAL | Status: AC
  Administered 2012-01-07: 50 mg via ORAL
  Filled 2012-01-07: qty 1

## 2012-01-07 MED ORDER — ONDANSETRON HCL 4 MG/2ML IJ SOLN: 4.0000 mg | Freq: Once | INTRAMUSCULAR | Status: DC

## 2012-01-07 MED ORDER — METOCLOPRAMIDE HCL 10 MG PO TABS
10.0000 mg | ORAL_TABLET | Freq: Once | ORAL | Status: AC
  Administered 2012-01-07: 10 mg via ORAL
  Filled 2012-01-07: qty 1

## 2012-01-07 MED ORDER — ONDANSETRON 4 MG PO TBDP: 4.0000 mg | ORAL_TABLET | Freq: Once | ORAL | Status: DC

## 2012-01-07 MED ORDER — BUTALBITAL-APAP-CAFFEINE 50-325-40 MG PO TABS
1.0000 | ORAL_TABLET | Freq: Once | ORAL | Status: AC
  Administered 2012-01-07: 1 via ORAL
  Filled 2012-01-07: qty 1

## 2012-01-07 NOTE — ED Notes (Signed)
Patient was seen at [facility] yesterday for the same.  Patient was given a headache cocktail with no improvement.  Patient reports double vision and abdominal pain/nausea.  Patient has not vomited.

## 2012-01-07 NOTE — ED Provider Notes (Signed)
History     CSN: [id number]  Arrival date & time 01/07/12  1248   First MD Initiated Contact with Patient 01/07/12 1317      Chief Complaint  Patient presents with  . Migraine    (Consider location/radiation/quality/duration/timing/severity/associated sxs/prior treatment) HPI Comments: Patient presents with complaint of migraine headache. She states the headache is frontal, 9/10, and like her other migraines. Associated symptoms include photophobia. Of note, patient was seen at [facility] yesterday for same complaint and states she was give vicodin. At that visit she was also instructed to follow-up with Neurology and take Fioricet. Patient states that she will not be able to see a Neurologist for a while, so she decided to come back. Of note patient has been seen here for multiple pain complaints, sometimes returning every 2-4 days. Denies fever or chills. Denies vomiting, diarrhea, or abdominal pain, but reports nausea. Denies neck stiffness.  The history is provided by the patient. No language interpreter was used.    Past Medical History  Diagnosis Date  . Bipolar 1 disorder   . Schizophrenia   . Anxiety   . Fibromyalgia   . GERD (gastroesophageal reflux disease)   . Hypertension   . Migraine     Past Surgical History  Procedure Date  . Cesarean section     History reviewed. No pertinent family history.  History  Substance Use Topics  . Smoking status: Current Every Day Smoker -- 2.0 packs/day for 15 years    Types: Cigarettes  . Smokeless tobacco: Never Used  . Alcohol Use: Yes    OB History    Grav Para Term Preterm Abortions TAB SAB Ect Mult Living                  Review of Systems  Constitutional: Negative for fever and chills.  Eyes: Positive for photophobia.  Gastrointestinal: Negative for nausea, vomiting, abdominal pain and diarrhea.  Neurological: Positive for headaches.    Allergies  Prednisone; Claritin; Nsaids; and Sulfa  antibiotics  Home Medications   Current Outpatient Rx  Name  Route  Sig  Dispense  Refill  . BUTALBITAL-APAP-CAFFEINE 50-325-40 MG PO TABS   Oral   Take 1-2 tablets by mouth every 6 (six) hours as needed for headache.   20 tablet   0   . CLONAZEPAM 1 MG PO TABS   Oral   Take 1 mg by mouth daily.         Marland Kitchen GABAPENTIN 300 MG PO CAPS   Oral   Take 1 capsule (300 mg total) by mouth 2 (two) times daily. At noon and 4pm for pain.   60 capsule   0   . HYDROCHLOROTHIAZIDE 25 MG PO TABS   Oral   Take 1 tablet (25 mg total) by mouth daily. For HTN   30 tablet   0   . LAMOTRIGINE 100 MG PO TABS   Oral   Take 1 tablet (100 mg total) by mouth daily. For mood stability.   30 tablet   0   . PAROXETINE HCL 20 MG PO TABS   Oral   Take 20 mg by mouth daily.         Marland Kitchen PROMETHAZINE HCL 25 MG RE SUPP   Rectal   Place 25 mg rectally every 6 (six) hours as needed. For nausea.           BP 132/75  Pulse 66  Temp 97.7 F (36.5 C) (Oral)  Resp 20  Ht 5\' 6"  (1.676 m)  Wt 162 lb (73.483 kg)  BMI 26.15 kg/m2  SpO2 99%  Physical Exam  Nursing note and vitals reviewed. Constitutional: She is oriented to person, place, and time. She appears well-developed and well-nourished.  HENT:  Head: Normocephalic and atraumatic.  Mouth/Throat: Oropharynx is clear and moist.  Eyes: Conjunctivae normal and EOM are normal. Pupils are equal, round, and reactive to light. No scleral icterus.  Neck: Normal range of motion. Neck supple.       No neck stiffness or meningeal signs.  Cardiovascular: Normal rate, regular rhythm and normal heart sounds.   Pulmonary/Chest: Effort normal and breath sounds normal.  Abdominal: Soft. Bowel sounds are normal. There is no tenderness.  Neurological: She is alert and oriented to person, place, and time. No cranial nerve deficit.       Cranial nerves II-XII grossly intact.  Skin: Skin is warm and dry.    ED Course  Procedures (including critical care  time)  Labs Reviewed - No data to display No results found.   1. Migraine       MDM  Patient presented with complaint of migraine. Declined migraine cocktail stating that "it does not work". Requesting Vicodin that she states she was given at visit yesterday, but I was unable to see that this was done in the records. Given Fioricet, metoclopramide, tramadol, and benadryl. Patient educated on narcotic non-use for migraine headaches. Patient seen here on numerous episodes for migraines and chronic pain, there is a serious component of drug seeking in the patient's behavior. Pain improved with Fioricet and discharged with return precautions. No red flags for meningitis or subarachnoid hemorraghe.        Pixie Casino, PA-C 01/07/12 1617

## 2012-01-07 NOTE — ED Notes (Signed)
Pt states migraine pain x3 days with light sensitivity and sensitivity to sound.  Pt states she went to [facility] yesterday and received a "cocktail of medicine that had made me nauseated and have pain in my stomach."  Pt unable to tolerate food and fluids. Pt states "the Vicodin helped my pain more than the cocktail. I just need some relief."

## 2012-01-07 NOTE — ED Notes (Signed)
Pt discharged home via self; pt given and explained discharge instructions, stated understanding; pt given bus pass prior to discharge; stable at discharge

## 2012-01-07 NOTE — ED Provider Notes (Signed)
Medical screening examination/treatment/procedure(s) were performed by non-physician practitioner and as supervising physician I was immediately available for consultation/collaboration.  Umaima Scholten T Rhyanna Sorce, MD 01/07/12 1645 

## 2012-01-12 ENCOUNTER — Emergency Department (HOSPITAL_COMMUNITY)
Admission: EM | Admit: 2012-01-12 | Discharge: 2012-01-12 | Disposition: A | Discharge status: Home / Self Care | Payer: Medicare Other | Attending: Emergency Medicine | Admitting: Emergency Medicine

## 2012-01-12 ENCOUNTER — Encounter (HOSPITAL_COMMUNITY): Payer: Self-pay

## 2012-01-12 DIAGNOSIS — R51 Headache: Secondary | ICD-10-CM | POA: Insufficient documentation

## 2012-01-12 DIAGNOSIS — F411 Generalized anxiety disorder: Secondary | ICD-10-CM | POA: Insufficient documentation

## 2012-01-12 DIAGNOSIS — I10 Essential (primary) hypertension: Secondary | ICD-10-CM | POA: Insufficient documentation

## 2012-01-12 DIAGNOSIS — Z79899 Other long term (current) drug therapy: Secondary | ICD-10-CM | POA: Insufficient documentation

## 2012-01-12 DIAGNOSIS — F17200 Nicotine dependence, unspecified, uncomplicated: Secondary | ICD-10-CM | POA: Insufficient documentation

## 2012-01-12 DIAGNOSIS — F319 Bipolar disorder, unspecified: Secondary | ICD-10-CM | POA: Insufficient documentation

## 2012-01-12 DIAGNOSIS — F209 Schizophrenia, unspecified: Secondary | ICD-10-CM | POA: Insufficient documentation

## 2012-01-12 DIAGNOSIS — M549 Dorsalgia, unspecified: Secondary | ICD-10-CM | POA: Insufficient documentation

## 2012-01-12 DIAGNOSIS — G8929 Other chronic pain: Secondary | ICD-10-CM | POA: Insufficient documentation

## 2012-01-12 DIAGNOSIS — IMO0001 Reserved for inherently not codable concepts without codable children: Secondary | ICD-10-CM | POA: Insufficient documentation

## 2012-01-12 DIAGNOSIS — K219 Gastro-esophageal reflux disease without esophagitis: Secondary | ICD-10-CM | POA: Insufficient documentation

## 2012-01-12 MED ORDER — HYDROMORPHONE HCL PF 1 MG/ML IJ SOLN
1.0000 mg | Freq: Once | INTRAMUSCULAR | Status: AC
  Administered 2012-01-12: 1 mg via INTRAMUSCULAR
  Filled 2012-01-12: qty 1

## 2012-01-12 MED ORDER — BUTALBITAL-APAP-CAFF-COD 50-325-40-30 MG PO CAPS: 1.0000 | ORAL_CAPSULE | ORAL | Status: DC | PRN

## 2012-01-12 NOTE — ED Notes (Signed)
MD at bedside. 

## 2012-01-12 NOTE — ED Provider Notes (Signed)
History     CSN: [id number]  Arrival date & time 01/12/12  4098   First MD Initiated Contact with Patient 01/12/12 1008      Chief Complaint  Patient presents with  . Medication Refill   headache  (Consider location/radiation/quality/duration/timing/severity/associated sxs/prior treatment) HPI This 46 year old female has been followed by [facility] for chronic headaches at least 2 weeks out of every month she has a headache, her current headache has been present for the last few days, her last headache before that was within the last week that lasted a few days as well, she is typical throbbing global headache with nausea without vomiting. She is typical slight blurred vision in both eyes 24 hours a day which is her headaches which has not changed her pad of years, she is no change in speech swallowing or understanding and no lateralizing or focal weakness numbness or incoordination. She wants a prescription for Fioricet with codeine and wants a narcotic injection and states she denies your neurologist today and her neurologist is refer her to a pain clinic which Pt has not done yet.  There is no change in his typical chronic headache compared to her usual chronic headaches. She has been using her Fioricet prescriptions up more frequently than she is supposed to use them as prescribed by her neurologist. She has chronic stable back pain and has not her problem today Past Medical History  Diagnosis Date  . Bipolar 1 disorder   . Schizophrenia   . Anxiety   . Fibromyalgia   . GERD (gastroesophageal reflux disease)   . Hypertension   . Migraine     Past Surgical History  Procedure Date  . Cesarean section     No family history on file.  History  Substance Use Topics  . Smoking status: Current Every Day Smoker -- 2.0 packs/day for 15 years    Types: Cigarettes  . Smokeless tobacco: Never Used  . Alcohol Use: Yes    OB History    Grav Para Term Preterm  Abortions TAB SAB Ect Mult Living                  Review of Systems 10 Systems reviewed and are negative for acute change except as noted in the HPI. Allergies  Prednisone; Claritin; Nsaids; and Sulfa antibiotics  Home Medications   Current Outpatient Rx  Name  Route  Sig  Dispense  Refill  . BUTALBITAL-APAP-CAFFEINE 50-325-40 MG PO TABS   Oral   Take 1-2 tablets by mouth every 6 (six) hours as needed for headache.   20 tablet   0   . CLONAZEPAM 1 MG PO TABS   Oral   Take 1 mg by mouth daily.         Marland Kitchen GABAPENTIN 300 MG PO CAPS   Oral   Take 1 capsule (300 mg total) by mouth 2 (two) times daily. At noon and 4pm for pain.   60 capsule   0   . HYDROCHLOROTHIAZIDE 25 MG PO TABS   Oral   Take 1 tablet (25 mg total) by mouth daily. For HTN   30 tablet   0   . LAMOTRIGINE 100 MG PO TABS   Oral   Take 1 tablet (100 mg total) by mouth daily. For mood stability.   30 tablet   0   . PAROXETINE HCL 20 MG PO TABS   Oral   Take 20 mg by mouth daily.         Marland Kitchen  PROMETHAZINE HCL 25 MG RE SUPP   Rectal   Place 25 mg rectally every 6 (six) hours as needed. For nausea.         Marland Kitchen BUTALBITAL-APAP-CAFF-COD 50-325-40-30 MG PO CAPS   Oral   Take 1 capsule by mouth every 4 (four) hours as needed for headache.   6 capsule   0     BP 129/75  Pulse 63  Temp 98.2 F (36.8 C) (Oral)  Resp 16  SpO2 100%  Physical Exam  Nursing note and vitals reviewed. Constitutional:       Awake, alert, nontoxic appearance with baseline speech for patient.  HENT:  Head: Atraumatic.  Mouth/Throat: No oropharyngeal exudate.  Eyes: EOM are normal. Pupils are equal, round, and reactive to light. Right eye exhibits no discharge. Left eye exhibits no discharge.  Neck: Neck supple.  Cardiovascular: Normal rate and regular rhythm.   No murmur heard. Pulmonary/Chest: Effort normal and breath sounds normal. No stridor. No respiratory distress. She has no wheezes. She has no rales. She  exhibits no tenderness.  Abdominal: Soft. Bowel sounds are normal. She exhibits no mass. There is no tenderness. There is no rebound.  Musculoskeletal: She exhibits no tenderness.       Baseline ROM, moves extremities with no obvious new focal weakness.  Lymphadenopathy:    She has no cervical adenopathy.  Neurological:       Awake, alert, cooperative and aware of situation; motor strength bilaterally; sensation normal to light touch bilaterally; peripheral visual fields full to confrontation; no facial asymmetry; tongue midline; major cranial nerves appear intact; no pronator drift, normal finger to nose bilaterally, baseline gait without new ataxia.  Skin: No rash noted.  Psychiatric: She has a normal mood and affect.    ED Course  Procedures (including critical care time) D/w [facility] await callback from [email or web address]  [facility] recs Pt can have one time narcotic shot in ED and small Rx for Fioricet w/codeine for today.1125 Labs Reviewed - No data to display No results found.   1. Headache   2. Chronic headaches   3. Chronic pain   4. Chronic back pain       MDM   Pt stable in ED with no significant deterioration in condition.  Patient / Family / Caregiver informed of clinical course, understand medical decision-making process, and agree with plan. I doubt any other EMC precluding discharge at this time including, but not necessarily limited to the following:SAH, SBI.       Hurman Horn, MD 01/13/12 2003

## 2012-01-12 NOTE — Discharge Instructions (Signed)
You are having a headache. No specific cause was found today for your headache. It may have been a migraine or other cause of headache. Stress, anxiety, fatigue, and depression are common triggers for headaches. Your headache today does not appear to be life-threatening or require hospitalization, but often the exact cause of headaches is not determined in the emergency department. Therefore, follow-up with your doctor is very important to find out what may have caused your headache, and whether or not you need any further diagnostic testing or treatment. Sometimes headaches can appear benign (not harmful), but then more serious symptoms can develop which should prompt an immediate re-evaluation by your doctor or the emergency department. °SEEK MEDICAL ATTENTION IF: °You develop possible problems with medications prescribed.  °The medications don't resolve your headache, if it recurs , or if you have multiple episodes of vomiting or can't take fluids. °You have a change from the usual headache. °RETURN IMMEDIATELY IF you develop a sudden, severe headache or confusion, become poorly responsive or faint, develop a fever above 100.4F or problem breathing, have a change in speech, vision, swallowing, or understanding, or develop new weakness, numbness, tingling, incoordination, or have a seizure. ° °Chronic Pain Discharge Instructions  °Emergency care providers appreciate that many patients coming to us are in severe pain and we wish to address their pain in the safest, most responsible manner.  It is important to recognize however, that the proper treatment of chronic pain differs from that of the pain of injuries and acute illnesses.  Our goal is to provide quality, safe, personalized care and we thank you for giving us the opportunity to serve you. °The use of narcotics and related agents for chronic pain syndromes may lead to additional physical and psychological problems.  Nearly as many people die from  prescription narcotics each year as die from car crashes.  Additionally, this risk is increased if such prescriptions are obtained from a variety of sources.  Therefore, only your primary care physician or a pain management specialist is able to safely treat such syndromes with narcotic medications long-term.   ° °Documentation revealing such prescriptions have been sought from multiple sources may prohibit us from providing a refill or different narcotic medication.  Your name may be checked first through the [place] Controlled Substances Reporting System.  This database is a record of controlled substance medication prescriptions that the patient has received.  This has been established by [facility] in an effort to eliminate the dangerous, and often life threatening, practice of obtaining multiple prescriptions from different medical providers.  ° °If you have a chronic pain syndrome (i.e. chronic headaches, recurrent back or neck pain, dental pain, abdominal or pelvis pain without a specific diagnosis, or neuropathic pain such as fibromyalgia) or recurrent visits for the same condition without an acute diagnosis, you may be treated with non-narcotics and other non-addictive medicines.  Allergic reactions or negative side effects that may be reported by a patient to such medications will not typically lead to the use of a narcotic analgesic or other controlled substance as an alternative. °  °Patients managing chronic pain with a personal physician should have provisions in place for breakthrough pain.  If you are in crisis, you should call your physician.  If your physician directs you to the emergency department, please have the doctor call and speak to our attending physician concerning your care. °  °When patients come to the Emergency Department (ED) with acute medical conditions in which the   Emergency Department physician feels appropriate to prescribe narcotic or sedating pain medication, the  physician will prescribe these in very limited quantities.  The amount of these medications will last only until you can see your primary care physician in his/her office.  Any patient who returns to the ED seeking refills should expect only non-narcotic pain medications.  ° °In the event of an acute medical condition exists and the emergency physician feels it is necessary that the patient be given a narcotic or sedating medication -  a responsible adult driver should be present in the room prior to the medication being given by the nurse. °  °Prescriptions for narcotic or sedating medications that have been lost, stolen or expired will not be refilled in the Emergency Department.   ° °Patients who have chronic pain may receive non-narcotic prescriptions until seen by their primary care physician.  It is every patient’s personal responsibility to maintain active prescriptions with his or her primary care physician or specialist. °

## 2012-01-12 NOTE — ED Notes (Signed)
Pt. Is here with a migraine headache and would like to have her medication refilled.  She takes Fioricet with Codeine.  Her Dr. Madelyn Flavors could not see her today.  The headache began 3 days ago.  She has requested something for pain before she leaves.  She does not want the Migraine Cocktail, it made her sick last time from her waist down.  She also would  Like a shot and she is allergic to Toradol. It does not help.  Last time she received 2 Vicodin but the injection makes her feel so much better.   She also has diarheea that began today.  1 episode.

## 2012-02-16 HISTORY — PX: MOUTH SURGERY: SHX715

## 2012-03-07 ENCOUNTER — Emergency Department (HOSPITAL_COMMUNITY)
Admission: EM | Admit: 2012-03-07 | Discharge: 2012-03-07 | Disposition: A | Discharge status: Home / Self Care | Payer: Medicare Other | Attending: Emergency Medicine | Admitting: Emergency Medicine

## 2012-03-07 ENCOUNTER — Encounter (HOSPITAL_COMMUNITY): Payer: Self-pay | Admitting: Emergency Medicine

## 2012-03-07 DIAGNOSIS — G8929 Other chronic pain: Secondary | ICD-10-CM | POA: Insufficient documentation

## 2012-03-07 DIAGNOSIS — R51 Headache: Secondary | ICD-10-CM | POA: Insufficient documentation

## 2012-03-07 DIAGNOSIS — Z8739 Personal history of other diseases of the musculoskeletal system and connective tissue: Secondary | ICD-10-CM | POA: Insufficient documentation

## 2012-03-07 DIAGNOSIS — M545 Low back pain, unspecified: Secondary | ICD-10-CM | POA: Insufficient documentation

## 2012-03-07 DIAGNOSIS — I10 Essential (primary) hypertension: Secondary | ICD-10-CM | POA: Insufficient documentation

## 2012-03-07 DIAGNOSIS — R110 Nausea: Secondary | ICD-10-CM | POA: Insufficient documentation

## 2012-03-07 DIAGNOSIS — F319 Bipolar disorder, unspecified: Secondary | ICD-10-CM | POA: Insufficient documentation

## 2012-03-07 DIAGNOSIS — Z8719 Personal history of other diseases of the digestive system: Secondary | ICD-10-CM | POA: Insufficient documentation

## 2012-03-07 DIAGNOSIS — F209 Schizophrenia, unspecified: Secondary | ICD-10-CM | POA: Insufficient documentation

## 2012-03-07 DIAGNOSIS — H53149 Visual discomfort, unspecified: Secondary | ICD-10-CM | POA: Insufficient documentation

## 2012-03-07 DIAGNOSIS — Z79899 Other long term (current) drug therapy: Secondary | ICD-10-CM | POA: Insufficient documentation

## 2012-03-07 DIAGNOSIS — F411 Generalized anxiety disorder: Secondary | ICD-10-CM | POA: Insufficient documentation

## 2012-03-07 DIAGNOSIS — Z7982 Long term (current) use of aspirin: Secondary | ICD-10-CM | POA: Insufficient documentation

## 2012-03-07 DIAGNOSIS — Z8679 Personal history of other diseases of the circulatory system: Secondary | ICD-10-CM | POA: Insufficient documentation

## 2012-03-07 DIAGNOSIS — F17200 Nicotine dependence, unspecified, uncomplicated: Secondary | ICD-10-CM | POA: Insufficient documentation

## 2012-03-07 DIAGNOSIS — H538 Other visual disturbances: Secondary | ICD-10-CM | POA: Insufficient documentation

## 2012-03-07 MED ORDER — PROMETHAZINE HCL 25 MG PO TABS: 25.0000 mg | ORAL_TABLET | Freq: Four times a day (QID) | ORAL | Status: DC | PRN

## 2012-03-07 MED ORDER — ONDANSETRON 4 MG PO TBDP
8.0000 mg | ORAL_TABLET | Freq: Once | ORAL | Status: AC
  Administered 2012-03-07: 8 mg via ORAL
  Filled 2012-03-07: qty 2

## 2012-03-07 MED ORDER — HYDROMORPHONE HCL PF 1 MG/ML IJ SOLN
1.0000 mg | Freq: Once | INTRAMUSCULAR | Status: AC
  Administered 2012-03-07: 1 mg via INTRAMUSCULAR
  Filled 2012-03-07: qty 1

## 2012-03-07 MED ORDER — OXYCODONE-ACETAMINOPHEN 5-325 MG PO TABS: 2.0000 | ORAL_TABLET | ORAL | Status: DC | PRN

## 2012-03-07 NOTE — ED Provider Notes (Signed)
History     CSN: [id number]  Arrival date & time 03/07/12  1054   First MD Initiated Contact with Patient 03/07/12 1307      Chief Complaint  Patient presents with  . Headache  . Back Pain    (Consider location/radiation/quality/duration/timing/severity/associated sxs/prior treatment) HPI.... bifrontal headache with nausea for 3 days. Patient has a long  History of headaches.   Also complains of associated herniated disc with radiation down the left leg. This is chronic. Patient has appointment with the [facility] pain clinic on February 7.  Nothing makes symptoms better or worse. Severity is moderate.  No fever, chills, stiff neck  Past Medical History  Diagnosis Date  . Bipolar 1 disorder   . Schizophrenia   . Anxiety   . Fibromyalgia   . GERD (gastroesophageal reflux disease)   . Hypertension   . Migraine     Past Surgical History  Procedure Date  . Cesarean section     History reviewed. No pertinent family history.  History  Substance Use Topics  . Smoking status: Current Every Day Smoker -- 2.0 packs/day for 15 years    Types: Cigarettes  . Smokeless tobacco: Never Used  . Alcohol Use: Yes    OB History    Grav Para Term Preterm Abortions TAB SAB Ect Mult Living                  Review of Systems  All other systems reviewed and are negative.    Allergies  Prednisone; Claritin; Nsaids; and Sulfa antibiotics  Home Medications   Current Outpatient Rx  Name  Route  Sig  Dispense  Refill  . GOODY HEADACHE PO   Oral   Take 1 packet by mouth daily as needed. For headache and body pain.         Marland Kitchen CLONAZEPAM 1 MG PO TABS   Oral   Take 1 mg by mouth daily.         Marland Kitchen GABAPENTIN 300 MG PO CAPS   Oral   Take 1 capsule (300 mg total) by mouth 2 (two) times daily. At noon and 4pm for pain.   60 capsule   0   . HYDROCHLOROTHIAZIDE 25 MG PO TABS   Oral   Take 1 tablet (25 mg total) by mouth daily. For HTN   30 tablet   0   . LAMOTRIGINE 100 MG PO  TABS   Oral   Take 1 tablet (100 mg total) by mouth daily. For mood stability.   30 tablet   0   . PAROXETINE HCL 20 MG PO TABS   Oral   Take 20 mg by mouth daily.         Marland Kitchen PROMETHAZINE HCL 25 MG RE SUPP   Rectal   Place 25 mg rectally every 6 (six) hours as needed. For nausea.         . OXYCODONE-ACETAMINOPHEN 5-325 MG PO TABS   Oral   Take 2 tablets by mouth every 4 (four) hours as needed for pain.   10 tablet   0   . PROMETHAZINE HCL 25 MG PO TABS   Oral   Take 1 tablet (25 mg total) by mouth every 6 (six) hours as needed for nausea.   10 tablet   0     BP 139/70  Pulse 65  Temp 98.1 F (36.7 C) (Oral)  Resp 18  SpO2 99%  Physical Exam  Nursing note and vitals reviewed. Constitutional:  She is oriented to person, place, and time. She appears well-developed and well-nourished.  HENT:  Head: Normocephalic and atraumatic.  Eyes: Conjunctivae normal and EOM are normal. Pupils are equal, round, and reactive to light.  Neck: Normal range of motion. Neck supple.  Cardiovascular: Normal rate, regular rhythm and normal heart sounds.   Pulmonary/Chest: Effort normal and breath sounds normal.  Abdominal: Soft. Bowel sounds are normal.  Musculoskeletal: Normal range of motion.  Neurological: She is alert and oriented to person, place, and time.  Skin: Skin is warm and dry.  Psychiatric: She has a normal mood and affect.    ED Course  Procedures (including critical care time)  Labs Reviewed - No data to display No results found.   1. Headache       MDM  No neuro deficits. Rx Dilaudid 1 mg IM and Zofran 8 mg ODT.   Discharge meds Percocet #10 and Phenergan 25 mg #10        Donnetta Hutching, MD 03/07/12 1415

## 2012-03-07 NOTE — ED Notes (Signed)
Pt here for migraine HA with photophobia and nausea x 3 days; pt sts hx of similar; pt sts lower back pain from chronic pain and denies new injury

## 2012-03-07 NOTE — ED Notes (Signed)
Patient is also reporting numbness in her arms and her hands intermittently

## 2012-03-07 NOTE — ED Notes (Signed)
Patient has hx of cluster migraines.  She states she has had headache for 3 days.  She has posterior headache and left sided temporal headache.  Patient states her eyes are painful as well.  Patient states her vision is blurred,  She is sensitive to light and noise,  She states the headache will wake her up in the middle of the night.  Patient is also complaining of pain in her lower back and into her left leg.  Patient states she has herniated disk in her back.  Patient reports she took goody's powder today w/o relief.  Patient states she does not have any migraine meds at this time.  Patient is scheduled to go to pain clinic for her headaches on 02-07.

## 2012-03-13 ENCOUNTER — Emergency Department (HOSPITAL_COMMUNITY)
Admission: EM | Admit: 2012-03-13 | Discharge: 2012-03-13 | Disposition: A | Discharge status: Home / Self Care | Payer: Medicare Other | Attending: Emergency Medicine | Admitting: Emergency Medicine

## 2012-03-13 ENCOUNTER — Encounter (HOSPITAL_COMMUNITY): Payer: Self-pay

## 2012-03-13 DIAGNOSIS — I10 Essential (primary) hypertension: Secondary | ICD-10-CM | POA: Insufficient documentation

## 2012-03-13 DIAGNOSIS — F319 Bipolar disorder, unspecified: Secondary | ICD-10-CM | POA: Insufficient documentation

## 2012-03-13 DIAGNOSIS — G43909 Migraine, unspecified, not intractable, without status migrainosus: Secondary | ICD-10-CM | POA: Insufficient documentation

## 2012-03-13 DIAGNOSIS — Z79899 Other long term (current) drug therapy: Secondary | ICD-10-CM | POA: Insufficient documentation

## 2012-03-13 DIAGNOSIS — F17200 Nicotine dependence, unspecified, uncomplicated: Secondary | ICD-10-CM | POA: Insufficient documentation

## 2012-03-13 DIAGNOSIS — Z8719 Personal history of other diseases of the digestive system: Secondary | ICD-10-CM | POA: Insufficient documentation

## 2012-03-13 DIAGNOSIS — R110 Nausea: Secondary | ICD-10-CM | POA: Insufficient documentation

## 2012-03-13 DIAGNOSIS — F209 Schizophrenia, unspecified: Secondary | ICD-10-CM | POA: Insufficient documentation

## 2012-03-13 DIAGNOSIS — F411 Generalized anxiety disorder: Secondary | ICD-10-CM | POA: Insufficient documentation

## 2012-03-13 MED ORDER — OXYCODONE-ACETAMINOPHEN 5-325 MG PO TABS
1.0000 | ORAL_TABLET | Freq: Once | ORAL | Status: AC
  Administered 2012-03-13: 1 via ORAL
  Filled 2012-03-13: qty 1

## 2012-03-13 MED ORDER — ONDANSETRON 4 MG PO TBDP
8.0000 mg | ORAL_TABLET | Freq: Once | ORAL | Status: AC
  Administered 2012-03-13: 8 mg via ORAL
  Filled 2012-03-13: qty 2

## 2012-03-13 NOTE — ED Notes (Addendum)
Pt states she was seen here recently and has an appt at the pain clinic soon but her pain is worse. Has a hx of cluster HA and migraine HA. Nausea, blurred vision, sensitive to light and sounds

## 2012-03-13 NOTE — ED Provider Notes (Signed)
History     CSN: [id number]  Arrival date & time 03/13/12  6578   First MD Initiated Contact with Patient 03/13/12 [phone]      Chief Complaint  Patient presents with  . Migraine    (Consider location/radiation/quality/duration/timing/severity/associated sxs/prior treatment) Patient is a 47 y.o. female presenting with migraines. The history is provided by the patient.  Migraine This is a recurrent problem. The problem occurs constantly. Associated symptoms include headaches and nausea. Pertinent negatives include no chills, fever or vomiting. Associated symptoms comments: She reports long history of migraine headaches diagnosed by neurology and treated with Percocet. She has been referred to Pain Management beginning early February. She states her headache is typical of chronic headaches without new symptoms or recent injury or trauma. .    Past Medical History  Diagnosis Date  . Bipolar 1 disorder   . Schizophrenia   . Anxiety   . Fibromyalgia   . GERD (gastroesophageal reflux disease)   . Hypertension   . Migraine     Past Surgical History  Procedure Date  . Cesarean section     No family history on file.  History  Substance Use Topics  . Smoking status: Current Every Day Smoker -- 2.0 packs/day for 15 years    Types: Cigarettes  . Smokeless tobacco: Never Used  . Alcohol Use: Yes    OB History    Grav Para Term Preterm Abortions TAB SAB Ect Mult Living                  Review of Systems  Constitutional: Negative for fever and chills.  Eyes: Positive for photophobia.  Respiratory: Negative.   Cardiovascular: Negative.   Gastrointestinal: Positive for nausea. Negative for vomiting.  Musculoskeletal: Negative.   Skin: Negative.   Neurological: Positive for headaches.  Hematological: Does not bruise/bleed easily.  Psychiatric/Behavioral: Negative for confusion.    Allergies  Prednisone; Claritin; Nsaids; and Sulfa antibiotics  Home Medications    Current Outpatient Rx  Name  Route  Sig  Dispense  Refill  . GOODY HEADACHE PO   Oral   Take 1 packet by mouth daily as needed. For headache and body pain.         Marland Kitchen CLONAZEPAM 1 MG PO TABS   Oral   Take 1 mg by mouth daily.         Marland Kitchen GABAPENTIN 300 MG PO CAPS   Oral   Take 1 capsule (300 mg total) by mouth 2 (two) times daily. At noon and 4pm for pain.   60 capsule   0   . HYDROCHLOROTHIAZIDE 25 MG PO TABS   Oral   Take 1 tablet (25 mg total) by mouth daily. For HTN   30 tablet   0   . LAMOTRIGINE 100 MG PO TABS   Oral   Take 1 tablet (100 mg total) by mouth daily. For mood stability.   30 tablet   0   . OXYCODONE-ACETAMINOPHEN 5-325 MG PO TABS   Oral   Take 2 tablets by mouth every 4 (four) hours as needed for pain.   10 tablet   0   . PAROXETINE HCL 20 MG PO TABS   Oral   Take 20 mg by mouth daily.         Marland Kitchen PROMETHAZINE HCL 25 MG RE SUPP   Rectal   Place 25 mg rectally every 6 (six) hours as needed. For nausea.         Marland Kitchen  PROMETHAZINE HCL 25 MG PO TABS   Oral   Take 1 tablet (25 mg total) by mouth every 6 (six) hours as needed for nausea.   10 tablet   0     BP 120/80  Pulse 88  Temp 98.4 F (36.9 C) (Oral)  Resp 20  SpO2 96%  Physical Exam  Constitutional: She is oriented to person, place, and time. She appears well-developed and well-nourished. No distress.  HENT:  Head: Normocephalic and atraumatic.  Eyes: Conjunctivae normal are normal. Pupils are equal, round, and reactive to light.  Neck: Normal range of motion. Neck supple.  Cardiovascular: Normal rate and regular rhythm.   Pulmonary/Chest: Effort normal and breath sounds normal.  Abdominal: Soft. Bowel sounds are normal. There is no tenderness. There is no rebound and no guarding.  Musculoskeletal: Normal range of motion.  Neurological: She is alert and oriented to person, place, and time. She has normal strength and normal reflexes. No sensory deficit. She displays a negative  Romberg sign. Coordination normal.       She is ambulatory without ataxia. Cranial nerves 3-12 grossly intact.   Skin: Skin is warm and dry. No rash noted.  Psychiatric: She has a normal mood and affect.    ED Course  Procedures (including critical care time)  Labs Reviewed - No data to display No results found.   No diagnosis found.  1. Migraine headache   MDM  Patient with chronic headaches presents with similar headache and normal neurologic exam. Discussed that no narcotic medications would be written but that pain would be managed in eD. She is encouraged to follow up with Pain mgmt as scheduled.        Arnoldo Hooker, PA-C 03/13/12 1013

## 2012-03-13 NOTE — ED Provider Notes (Signed)
Medical screening examination/treatment/procedure(s) were performed by non-physician practitioner and as supervising physician I was immediately available for consultation/collaboration.   Richardean Canal, MD 03/13/12 1020

## 2012-03-15 ENCOUNTER — Emergency Department (HOSPITAL_COMMUNITY)
Admission: EM | Admit: 2012-03-15 | Discharge: 2012-03-15 | Disposition: A | Discharge status: Home / Self Care | Payer: Medicare Other | Attending: Emergency Medicine | Admitting: Emergency Medicine

## 2012-03-15 ENCOUNTER — Encounter (HOSPITAL_COMMUNITY): Payer: Self-pay | Admitting: *Deleted

## 2012-03-15 DIAGNOSIS — R110 Nausea: Secondary | ICD-10-CM | POA: Insufficient documentation

## 2012-03-15 DIAGNOSIS — F319 Bipolar disorder, unspecified: Secondary | ICD-10-CM | POA: Insufficient documentation

## 2012-03-15 DIAGNOSIS — F411 Generalized anxiety disorder: Secondary | ICD-10-CM | POA: Insufficient documentation

## 2012-03-15 DIAGNOSIS — M549 Dorsalgia, unspecified: Secondary | ICD-10-CM | POA: Insufficient documentation

## 2012-03-15 DIAGNOSIS — Z8719 Personal history of other diseases of the digestive system: Secondary | ICD-10-CM | POA: Insufficient documentation

## 2012-03-15 DIAGNOSIS — F17200 Nicotine dependence, unspecified, uncomplicated: Secondary | ICD-10-CM | POA: Insufficient documentation

## 2012-03-15 DIAGNOSIS — Z79899 Other long term (current) drug therapy: Secondary | ICD-10-CM | POA: Insufficient documentation

## 2012-03-15 DIAGNOSIS — Z8739 Personal history of other diseases of the musculoskeletal system and connective tissue: Secondary | ICD-10-CM | POA: Insufficient documentation

## 2012-03-15 DIAGNOSIS — G43909 Migraine, unspecified, not intractable, without status migrainosus: Secondary | ICD-10-CM

## 2012-03-15 DIAGNOSIS — F209 Schizophrenia, unspecified: Secondary | ICD-10-CM | POA: Insufficient documentation

## 2012-03-15 DIAGNOSIS — H53149 Visual discomfort, unspecified: Secondary | ICD-10-CM | POA: Insufficient documentation

## 2012-03-15 DIAGNOSIS — I10 Essential (primary) hypertension: Secondary | ICD-10-CM | POA: Insufficient documentation

## 2012-03-15 MED ORDER — ONDANSETRON HCL 4 MG/2ML IJ SOLN
4.0000 mg | Freq: Once | INTRAMUSCULAR | Status: AC
  Administered 2012-03-15: 4 mg via INTRAMUSCULAR
  Filled 2012-03-15: qty 2

## 2012-03-15 MED ORDER — KETOROLAC TROMETHAMINE 60 MG/2ML IM SOLN
60.0000 mg | Freq: Once | INTRAMUSCULAR | Status: AC
  Administered 2012-03-15: 60 mg via INTRAMUSCULAR
  Filled 2012-03-15: qty 2

## 2012-03-15 NOTE — ED Notes (Signed)
Pt reports migraine.  Seen for same yesterday.  Reports blurred vision.  Due to have treatment from PCP on Feb 7.

## 2012-03-15 NOTE — ED Provider Notes (Signed)
History     CSN: [id number]  Arrival date & time 03/15/12  1217   First MD Initiated Contact with Patient 03/15/12 1231      Chief Complaint  Patient presents with  . Migraine    (Consider location/radiation/quality/duration/timing/severity/associated sxs/prior treatment) Patient is a 47 y.o. female presenting with migraines. The history is provided by the patient.  Migraine This is a chronic problem. Associated symptoms include headaches and nausea. Pertinent negatives include no chills, fever, neck pain or vomiting. Associated symptoms comments: Chronic headache pain without change from usual symptoms. She has been seen through the emergency department multiple times this month for the same headache. No outpatient follow up with either her neurologist or her primary care physician..    Past Medical History  Diagnosis Date  . Bipolar 1 disorder   . Schizophrenia   . Anxiety   . Fibromyalgia   . GERD (gastroesophageal reflux disease)   . Hypertension   . Migraine     Past Surgical History  Procedure Date  . Cesarean section     History reviewed. No pertinent family history.  History  Substance Use Topics  . Smoking status: Current Every Day Smoker -- 2.0 packs/day for 15 years    Types: Cigarettes  . Smokeless tobacco: Never Used  . Alcohol Use: Yes    OB History    Grav Para Term Preterm Abortions TAB SAB Ect Mult Living                  Review of Systems  Constitutional: Negative for fever and chills.  HENT: Negative for neck pain.   Eyes: Positive for photophobia.  Respiratory: Negative.   Cardiovascular: Negative.   Gastrointestinal: Positive for nausea. Negative for vomiting.  Musculoskeletal: Positive for back pain.  Skin: Negative.   Neurological: Positive for headaches.    Allergies  Prednisone; Claritin; Nsaids; and Sulfa antibiotics  Home Medications   Current Outpatient Rx  Name  Route  Sig  Dispense  Refill  . GOODY HEADACHE PO  Oral   Take 1 packet by mouth daily as needed. For headache and body pain.         Marland Kitchen CLONAZEPAM 1 MG PO TABS   Oral   Take 1 mg by mouth daily.         Marland Kitchen GABAPENTIN 300 MG PO CAPS   Oral   Take 1 capsule (300 mg total) by mouth 2 (two) times daily. At noon and 4pm for pain.   60 capsule   0   . HYDROCHLOROTHIAZIDE 25 MG PO TABS   Oral   Take 1 tablet (25 mg total) by mouth daily. For HTN   30 tablet   0   . LAMOTRIGINE 100 MG PO TABS   Oral   Take 1 tablet (100 mg total) by mouth daily. For mood stability.   30 tablet   0   . OXYCODONE-ACETAMINOPHEN 5-325 MG PO TABS   Oral   Take 2 tablets by mouth every 4 (four) hours as needed for pain.   10 tablet   0   . PAROXETINE HCL 20 MG PO TABS   Oral   Take 20 mg by mouth daily.         Marland Kitchen PROMETHAZINE HCL 25 MG RE SUPP   Rectal   Place 25 mg rectally every 6 (six) hours as needed. For nausea.         Marland Kitchen PROMETHAZINE HCL 25 MG PO TABS   Oral  Take 1 tablet (25 mg total) by mouth every 6 (six) hours as needed for nausea.   10 tablet   0     BP 124/80  Pulse 80  Temp 97.6 F (36.4 C)  Resp 18  SpO2 100%  Physical Exam  Constitutional: She is oriented to person, place, and time. She appears well-developed and well-nourished.  HENT:  Head: Normocephalic.  Eyes: Pupils are equal, round, and reactive to light.  Neck: Normal range of motion. Neck supple.  Cardiovascular: Normal rate and regular rhythm.   Pulmonary/Chest: Effort normal and breath sounds normal.  Abdominal: Soft. Bowel sounds are normal. There is no tenderness. There is no rebound and no guarding.  Musculoskeletal: Normal range of motion.  Neurological: She is alert and oriented to person, place, and time. She has normal strength and normal reflexes. No sensory deficit. She displays a negative Romberg sign. Coordination normal.       Ambulatory without ataxia. Steady gait.  Skin: Skin is warm and dry. No rash noted.  Psychiatric: She has a  normal mood and affect.    ED Course  Procedures (including critical care time)  Labs Reviewed - No data to display No results found.   No diagnosis found. 1. Chronic migraine headache   MDM  Discussed chronic pain management not being appropriate in the emergency department. She has a normal neurologic exam, she is alert and oriented, coherent, no history of confusion or illness.         Arnoldo Hooker, PA-C 03/15/12 1251

## 2012-03-15 NOTE — ED Notes (Signed)
PA discussed follow up instructions with pt. Pt requesting narcotics. PA notified and RN explained for to see PCP or Neurologist for further evaluation and pain medication. Pt left before discharge instructions were printed.

## 2012-03-15 NOTE — ED Provider Notes (Signed)
Medical screening examination/treatment/procedure(s) were performed by non-physician practitioner and as supervising physician I was immediately available for consultation/collaboration.   Joyel Chenette L Tashianna Broome, MD 03/15/12 1812 

## 2012-08-07 ENCOUNTER — Encounter (HOSPITAL_COMMUNITY): Payer: Self-pay | Admitting: *Deleted

## 2012-08-07 ENCOUNTER — Emergency Department (HOSPITAL_COMMUNITY)
Admission: EM | Admit: 2012-08-07 | Discharge: 2012-08-07 | Disposition: A | Discharge status: Home / Self Care | Payer: Medicare Other | Attending: Emergency Medicine | Admitting: Emergency Medicine

## 2012-08-07 DIAGNOSIS — K2990 Gastroduodenitis, unspecified, without bleeding: Secondary | ICD-10-CM | POA: Insufficient documentation

## 2012-08-07 DIAGNOSIS — Z79899 Other long term (current) drug therapy: Secondary | ICD-10-CM | POA: Insufficient documentation

## 2012-08-07 DIAGNOSIS — Z8739 Personal history of other diseases of the musculoskeletal system and connective tissue: Secondary | ICD-10-CM | POA: Insufficient documentation

## 2012-08-07 DIAGNOSIS — K2970 Gastritis, unspecified, without bleeding: Secondary | ICD-10-CM | POA: Insufficient documentation

## 2012-08-07 DIAGNOSIS — R109 Unspecified abdominal pain: Secondary | ICD-10-CM

## 2012-08-07 DIAGNOSIS — F411 Generalized anxiety disorder: Secondary | ICD-10-CM | POA: Insufficient documentation

## 2012-08-07 DIAGNOSIS — R110 Nausea: Secondary | ICD-10-CM | POA: Insufficient documentation

## 2012-08-07 DIAGNOSIS — Z8659 Personal history of other mental and behavioral disorders: Secondary | ICD-10-CM | POA: Insufficient documentation

## 2012-08-07 DIAGNOSIS — Z8679 Personal history of other diseases of the circulatory system: Secondary | ICD-10-CM | POA: Insufficient documentation

## 2012-08-07 DIAGNOSIS — F17200 Nicotine dependence, unspecified, uncomplicated: Secondary | ICD-10-CM | POA: Insufficient documentation

## 2012-08-07 DIAGNOSIS — IMO0001 Reserved for inherently not codable concepts without codable children: Secondary | ICD-10-CM | POA: Insufficient documentation

## 2012-08-07 DIAGNOSIS — F319 Bipolar disorder, unspecified: Secondary | ICD-10-CM | POA: Insufficient documentation

## 2012-08-07 DIAGNOSIS — I10 Essential (primary) hypertension: Secondary | ICD-10-CM | POA: Insufficient documentation

## 2012-08-07 DIAGNOSIS — R1013 Epigastric pain: Secondary | ICD-10-CM | POA: Insufficient documentation

## 2012-08-07 LAB — CBC WITH DIFFERENTIAL/PLATELET
Basophils Absolute: 0 10*3/uL (ref 0.0–0.1)
Basophils Relative: 1 % (ref 0–1)
Eosinophils Absolute: 0.3 10*3/uL (ref 0.0–0.7)
Eosinophils Relative: 5 % (ref 0–5)
HCT: 39.2 % (ref 36.0–46.0)
MCH: 33.2 pg (ref 26.0–34.0)
MCHC: 32.9 g/dL (ref 30.0–36.0)
MCV: 101 fL — ABNORMAL HIGH (ref 78.0–100.0)
Monocytes Absolute: 0.4 10*3/uL (ref 0.1–1.0)
Platelets: 165 10*3/uL (ref 150–400)
RDW: 13.9 % (ref 11.5–15.5)
WBC: 6 10*3/uL (ref 4.0–10.5)

## 2012-08-07 LAB — COMPREHENSIVE METABOLIC PANEL
ALT: 12 U/L (ref 0–35)
AST: 18 U/L (ref 0–37)
Calcium: 9.3 mg/dL (ref 8.4–10.5)
Creatinine, Ser: 0.97 mg/dL (ref 0.50–1.10)
GFR calc non Af Amer: 68 mL/min — ABNORMAL LOW (ref >90)
Sodium: 140 mEq/L (ref 135–145)
Total Protein: 7.3 g/dL (ref 6.0–8.3)

## 2012-08-07 LAB — POCT I-STAT TROPONIN I: Troponin i, poc: 0.01 ng/mL (ref 0.00–0.08)

## 2012-08-07 MED ORDER — PANTOPRAZOLE SODIUM 20 MG PO TBEC: 40.0000 mg | DELAYED_RELEASE_TABLET | Freq: Every day | ORAL | Status: DC

## 2012-08-07 MED ORDER — HYDROCODONE-ACETAMINOPHEN 5-325 MG PO TABS
1.0000 | ORAL_TABLET | Freq: Once | ORAL | Status: AC
  Administered 2012-08-07: 1 via ORAL
  Filled 2012-08-07: qty 1

## 2012-08-07 MED ORDER — MORPHINE SULFATE 4 MG/ML IJ SOLN
4.0000 mg | Freq: Once | INTRAMUSCULAR | Status: AC
  Administered 2012-08-07: 4 mg via INTRAVENOUS
  Filled 2012-08-07 (×2): qty 1

## 2012-08-07 MED ORDER — ONDANSETRON HCL 4 MG/2ML IJ SOLN
4.0000 mg | Freq: Once | INTRAMUSCULAR | Status: AC
  Administered 2012-08-07: 4 mg via INTRAVENOUS
  Filled 2012-08-07: qty 2

## 2012-08-07 MED ORDER — HYDROCODONE-ACETAMINOPHEN 5-325 MG PO TABS: 1.0000 | ORAL_TABLET | ORAL | Status: DC | PRN

## 2012-08-07 MED ORDER — SODIUM CHLORIDE 0.9 % IV BOLUS (SEPSIS)
1000.0000 mL | Freq: Once | INTRAVENOUS | Status: AC
  Administered 2012-08-07: 1000 mL via INTRAVENOUS

## 2012-08-07 MED ORDER — GI COCKTAIL ~~LOC~~
30.0000 mL | Freq: Once | ORAL | Status: AC
  Administered 2012-08-07: 30 mL via ORAL
  Filled 2012-08-07: qty 30

## 2012-08-07 NOTE — ED Provider Notes (Signed)
History    CSN: [id number] Arrival date & time 08/07/12  1539  First MD Initiated Contact with Patient 08/07/12 1656     Chief Complaint  Patient presents with  . Abdominal Pain   (Consider location/radiation/quality/duration/timing/severity/associated sxs/prior Treatment) Patient is a 47 y.o. female presenting with abdominal pain. The history is provided by the patient.  Abdominal Pain This is a new problem. The current episode started 1 to 4 weeks ago (about two weeks ago). The problem has been gradually worsening. Associated symptoms include abdominal pain and nausea. Pertinent negatives include no chest pain, chills, congestion, fever, numbness, vomiting or weakness. The symptoms are aggravated by eating. She has tried nothing for the symptoms.   Past Medical History  Diagnosis Date  . Bipolar 1 disorder   . Schizophrenia   . Anxiety   . Fibromyalgia   . GERD (gastroesophageal reflux disease)   . Hypertension   . Migraine    Past Surgical History  Procedure Laterality Date  . Cesarean section     No family history on file. History  Substance Use Topics  . Smoking status: Current Every Day Smoker -- 2.00 packs/day for 15 years    Types: Cigarettes  . Smokeless tobacco: Never Used  . Alcohol Use: No   OB History   Grav Para Term Preterm Abortions TAB SAB Ect Mult Living                 Review of Systems  Constitutional: Negative for fever and chills.  HENT: Negative for congestion and rhinorrhea.   Respiratory: Negative for chest tightness and shortness of breath.   Cardiovascular: Negative for chest pain.  Gastrointestinal: Positive for nausea and abdominal pain. Negative for vomiting, diarrhea, constipation and blood in stool.       Reports "black" stool two days ago, none since.    Genitourinary: Negative for dysuria.  Neurological: Negative for dizziness, weakness and numbness.  All other systems reviewed and are negative.    Allergies  Prednisone;  Claritin; Nsaids; and Sulfa antibiotics  Home Medications   Current Outpatient Rx  Name  Route  Sig  Dispense  Refill  . Aspirin-Acetaminophen-Caffeine (GOODY HEADACHE PO)   Oral   Take 1 packet by mouth daily as needed. For headache and body pain.         . hydrochlorothiazide (HYDRODIURIL) 25 MG tablet   Oral   Take 1 tablet (25 mg total) by mouth daily. For HTN   30 tablet   0   . lamoTRIgine (LAMICTAL) 100 MG tablet   Oral   Take 1 tablet (100 mg total) by mouth daily. For mood stability.   30 tablet   0   . PARoxetine (PAXIL) 20 MG tablet   Oral   Take 20 mg by mouth daily.          BP 135/78  Pulse 85  Temp(Src) 98.3 F (36.8 C) (Oral)  Resp 16  SpO2 100% Physical Exam  Nursing note and vitals reviewed. Constitutional: She is oriented to person, place, and time. She appears well-developed and well-nourished. No distress.  HENT:  Head: Normocephalic and atraumatic.  Mouth/Throat: Oropharynx is clear and moist.  Eyes: EOM are normal. Pupils are equal, round, and reactive to light.  Neck: Normal range of motion. Neck supple.  Cardiovascular: Normal rate, regular rhythm and normal heart sounds.  Exam reveals no friction rub.   No murmur heard. Pulmonary/Chest: Effort normal and breath sounds normal. No respiratory distress. She has  no wheezes. She has no rales.  Abdominal: Soft. There is tenderness (mild TTP mid epigastrium. no RUQ TTP. ). There is no rebound and no guarding.  Genitourinary: Guaiac negative stool.  No gross blood on glove, no melenotic stool.   Musculoskeletal: Normal range of motion. She exhibits no edema and no tenderness.  Lymphadenopathy:    She has no cervical adenopathy.  Neurological: She is alert and oriented to person, place, and time.  Skin: Skin is warm and dry. No rash noted.  Psychiatric: She has a normal mood and affect. Her behavior is normal.    ED Course  Procedures (including critical care time) Labs Reviewed  CBC WITH  DIFFERENTIAL - Abnormal; Notable for the following:    MCV 101.0 (*)    All other components within normal limits  COMPREHENSIVE METABOLIC PANEL - Abnormal; Notable for the following:    Total Bilirubin 0.1 (*)    GFR calc non Af Amer 68 (*)    GFR calc Af Amer 79 (*)    All other components within normal limits  LIPASE, BLOOD  URINALYSIS, ROUTINE W REFLEX MICROSCOPIC  POCT I-STAT TROPONIN I   Date: 08/07/2012  Rate: 78  Rhythm: normal sinus rhythm  QRS Axis: normal  Intervals: normal  ST/T Wave abnormalities: normal  Conduction Disutrbances:none  Narrative Interpretation:   Old EKG Reviewed: unchanged   No results found. 1. Abdominal pain   2. Gastritis     MDM  63:27 PM 47 year old female with history of bipolar disorder, schizophrenia and GERD presenting with 4 days of progressive abdominal pain she localizes over her epigastrium. Pain is worse after she eats, but the past day or so she has had pain without eating. She states it will last anywhere from 20 minutes to about 2 hours and is severe. It is not associated with any particular type of food. She endorses nausea but no vomiting and endorses her stool was black 2 days ago. She has been taking aspirin and ibuprofen for pain. Suspect likely gastritis versus ulcer. She has no right upper quadrant abdominal pain, doubt cholecystitis, do not feel imaging indicated at this time. Will check labs, GI cocktail, EKG.  10:10PM: Labs show no significant abnormality. Patient is asleep on the bed during my reevaluation. Do not feel imaging is indicated. Given pain worse after eating and heavy NSAID and aspirin use, this is likely gastritis with possible ulcer. Patient was Hemoccult negative from below. Will discharge with a prescription for Protonix. Given number to followup and establish with primary physician. Counseled on cessation of Motrin, Advil, aspirin and related medicines. Patient voiced understanding and was discharged in stable  condition.  Caren Hazy, MD 08/07/12 2250

## 2012-08-07 NOTE — ED Provider Notes (Signed)
47 year old female with epigastric pain for about the last 2 weeks which is getting worse. It is not affected by eating. There's been associated nausea but no vomiting or diarrhea. She has taken some antacids with no relief. She has noted dark stools. Denies dizziness or lightheadedness. On exam, she does have localized tenderness in the epigastric area. She will need to have a stool Hemoccult to make sure she's not having an upper GI bleed and she be given GI cocktail to see she gets symptomatic relief. She likely will need to be treated for possible peptic ulcer disease.  I saw and evaluated the patient, reviewed the resident's note and I agree with the findings and plan.  Dione Booze, MD 08/07/12 [phone]

## 2012-08-07 NOTE — ED Notes (Signed)
Unable to give urine sample in triage 

## 2012-08-07 NOTE — ED Notes (Signed)
Pt states every time she eats she gets mid abdominal pain 15 minutes later.  No vomiting or diarrhea.  Pt states that when she gets pain it makes her dizzy.  Pt states she cannot eat anything

## 2012-08-07 NOTE — ED Notes (Signed)
Pt reports that she develops chest pain with these episodes

## 2012-08-07 NOTE — ED Notes (Signed)
No answer x1

## 2012-11-04 ENCOUNTER — Emergency Department (HOSPITAL_COMMUNITY)
Admission: EM | Admit: 2012-11-04 | Discharge: 2012-11-04 | Disposition: A | Discharge status: Home / Self Care | Payer: Medicare Other | Attending: Emergency Medicine | Admitting: Emergency Medicine

## 2012-11-04 ENCOUNTER — Encounter (HOSPITAL_COMMUNITY): Payer: Self-pay | Admitting: *Deleted

## 2012-11-04 ENCOUNTER — Emergency Department (HOSPITAL_COMMUNITY): Payer: Medicare Other

## 2012-11-04 DIAGNOSIS — F209 Schizophrenia, unspecified: Secondary | ICD-10-CM | POA: Insufficient documentation

## 2012-11-04 DIAGNOSIS — I10 Essential (primary) hypertension: Secondary | ICD-10-CM | POA: Insufficient documentation

## 2012-11-04 DIAGNOSIS — R52 Pain, unspecified: Secondary | ICD-10-CM

## 2012-11-04 DIAGNOSIS — F17200 Nicotine dependence, unspecified, uncomplicated: Secondary | ICD-10-CM | POA: Insufficient documentation

## 2012-11-04 DIAGNOSIS — K219 Gastro-esophageal reflux disease without esophagitis: Secondary | ICD-10-CM | POA: Insufficient documentation

## 2012-11-04 DIAGNOSIS — R51 Headache: Secondary | ICD-10-CM | POA: Insufficient documentation

## 2012-11-04 DIAGNOSIS — Z8739 Personal history of other diseases of the musculoskeletal system and connective tissue: Secondary | ICD-10-CM | POA: Insufficient documentation

## 2012-11-04 DIAGNOSIS — Z3202 Encounter for pregnancy test, result negative: Secondary | ICD-10-CM | POA: Insufficient documentation

## 2012-11-04 DIAGNOSIS — F319 Bipolar disorder, unspecified: Secondary | ICD-10-CM | POA: Insufficient documentation

## 2012-11-04 DIAGNOSIS — F411 Generalized anxiety disorder: Secondary | ICD-10-CM | POA: Insufficient documentation

## 2012-11-04 DIAGNOSIS — G43909 Migraine, unspecified, not intractable, without status migrainosus: Secondary | ICD-10-CM | POA: Insufficient documentation

## 2012-11-04 DIAGNOSIS — Z79899 Other long term (current) drug therapy: Secondary | ICD-10-CM | POA: Insufficient documentation

## 2012-11-04 LAB — CBC WITH DIFFERENTIAL/PLATELET
Eosinophils Absolute: 0.2 10*3/uL (ref 0.0–0.7)
Hemoglobin: 11.6 g/dL — ABNORMAL LOW (ref 12.0–15.0)
Lymphocytes Relative: 34 % (ref 12–46)
Lymphs Abs: 1.8 10*3/uL (ref 0.7–4.0)
MCH: 33.2 pg (ref 26.0–34.0)
MCV: 105.2 fL — ABNORMAL HIGH (ref 78.0–100.0)
Monocytes Relative: 7 % (ref 3–12)
Neutrophils Relative %: 56 % (ref 43–77)
RBC: 3.49 MIL/uL — ABNORMAL LOW (ref 3.87–5.11)
WBC: 5.3 10*3/uL (ref 4.0–10.5)

## 2012-11-04 LAB — BASIC METABOLIC PANEL
BUN: 12 mg/dL (ref 6–23)
CO2: 25 mEq/L (ref 19–32)
Chloride: 108 mEq/L (ref 96–112)
GFR calc non Af Amer: 79 mL/min — ABNORMAL LOW (ref >90)
Glucose, Bld: 88 mg/dL (ref 70–99)
Potassium: 3.4 mEq/L — ABNORMAL LOW (ref 3.5–5.1)
Sodium: 144 mEq/L (ref 135–145)

## 2012-11-04 MED ORDER — METOCLOPRAMIDE HCL 5 MG/ML IJ SOLN
10.0000 mg | Freq: Once | INTRAMUSCULAR | Status: AC
  Administered 2012-11-04: 10 mg via INTRAVENOUS
  Filled 2012-11-04: qty 2

## 2012-11-04 MED ORDER — KETOROLAC TROMETHAMINE 30 MG/ML IJ SOLN
30.0000 mg | Freq: Once | INTRAMUSCULAR | Status: AC
  Administered 2012-11-04: 30 mg via INTRAVENOUS
  Filled 2012-11-04: qty 1

## 2012-11-04 MED ORDER — SODIUM CHLORIDE 0.9 % IV BOLUS (SEPSIS)
1000.0000 mL | Freq: Once | INTRAVENOUS | Status: AC
Start: 2012-11-04 — End: 2012-11-04
  Administered 2012-11-04: 1000 mL via INTRAVENOUS

## 2012-11-04 NOTE — ED Provider Notes (Signed)
CSN: [id number]     Arrival date & time 11/04/12  1942 History   First MD Initiated Contact with Patient 11/04/12 1958     Chief Complaint  Patient presents with  . Generalized Body Aches   (Consider location/radiation/quality/duration/timing/severity/associated sxs/prior Treatment) HPI Comments: 47 year old female with past medical history of fibromyalgia, migraines, bipolar and schizophrenia multiple complaints including diffuse aching body pain, concern for pneumonia and concern for UTI.  She is currently on ciprofloxacin for a UTI diagnosed by her PCP. Her symptoms have improved, but she still reports some urinary frequency.  She reports that she was told in the 1980s that she had staph infection in her lungs. She was to be checked for this and thinks she may have a pneumonia. She also reports that her fibromyalgia is "acting up".  She reports pain in her neck, arms, legs, back and shoulders. She rates her pain currently at 8/10. She describes it as aching and stabbing. She states it is similar to her prior fibromyalgia pain.   Past Medical History  Diagnosis Date  . Bipolar 1 disorder   . Schizophrenia   . Anxiety   . Fibromyalgia   . GERD (gastroesophageal reflux disease)   . Hypertension   . Migraine    Past Surgical History  Procedure Laterality Date  . Cesarean section     No family history on file. History  Substance Use Topics  . Smoking status: Current Every Day Smoker -- 2.00 packs/day for 15 years    Types: Cigarettes  . Smokeless tobacco: Never Used  . Alcohol Use: No   OB History   Grav Para Term Preterm Abortions TAB SAB Ect Mult Living                 Review of Systems  Constitutional: Negative for fever, chills and unexpected weight change.  HENT: Negative for trouble swallowing and neck stiffness.   Respiratory: Positive for cough (Nonproductive, chronic). Negative for chest tightness and shortness of breath.   Cardiovascular: Negative for chest pain and  palpitations.  Gastrointestinal: Negative for nausea, vomiting, abdominal pain and diarrhea.  Genitourinary: Negative for dysuria and frequency.  Musculoskeletal: Negative for back pain and gait problem.  Skin: Negative for rash.  Neurological: Negative for weakness and numbness.  Hematological: Negative for adenopathy. Does not bruise/bleed easily.  Psychiatric/Behavioral: Negative for suicidal ideas.  All other systems reviewed and are negative.    Allergies  Prednisone; Claritin; Nsaids; and Sulfa antibiotics  Home Medications   Current Outpatient Rx  Name  Route  Sig  Dispense  Refill  . Aspirin-Acetaminophen-Caffeine (GOODY HEADACHE PO)   Oral   Take 1 packet by mouth daily as needed. For headache and body pain.         . hydrochlorothiazide (HYDRODIURIL) 25 MG tablet   Oral   Take 1 tablet (25 mg total) by mouth daily. For HTN   30 tablet   0   . HYDROcodone-acetaminophen (NORCO/VICODIN) 5-325 MG per tablet   Oral   Take 1 tablet by mouth every 4 (four) hours as needed for pain.   6 tablet   0   . lamoTRIgine (LAMICTAL) 100 MG tablet   Oral   Take 1 tablet (100 mg total) by mouth daily. For mood stability.   30 tablet   0   . pantoprazole (PROTONIX) 20 MG tablet   Oral   Take 2 tablets (40 mg total) by mouth daily.   30 tablet   0   .  PARoxetine (PAXIL) 20 MG tablet   Oral   Take 20 mg by mouth daily.          BP 132/80  Pulse 72  Temp(Src) 98.2 F (36.8 C) (Oral)  Resp 18  SpO2 98% Physical Exam  Vitals reviewed. Constitutional: She is oriented to person, place, and time. She appears well-developed and well-nourished. No distress.  HENT:  Right Ear: External ear normal.  Left Ear: External ear normal.  Mouth/Throat: No oropharyngeal exudate.  Eyes: Conjunctivae and EOM are normal. Pupils are equal, round, and reactive to light.  Neck: Normal range of motion. Neck supple.  Cardiovascular: Normal rate, regular rhythm, normal heart sounds and  intact distal pulses.  Exam reveals no gallop and no friction rub.   No murmur heard. Pulmonary/Chest: Effort normal and breath sounds normal.  Abdominal: Soft. Bowel sounds are normal. She exhibits no distension. There is no tenderness.  Musculoskeletal: Normal range of motion. She exhibits no edema.  She endorses tenderness to palpation of her arms, neck, legs, back. Her pain does not localize.  Of note, with distraction her neck pain was no longer present.  Neurological: She is alert and oriented to person, place, and time. She has normal strength. No cranial nerve deficit or sensory deficit. Gait normal.  Skin: Skin is warm and dry. No rash noted. She is not diaphoretic.  Psychiatric: She has a normal mood and affect.    ED Course  Procedures (including critical care time) Labs Review Labs Reviewed  CBC WITH DIFFERENTIAL - Abnormal; Notable for the following:    RBC 3.49 (*)    Hemoglobin 11.6 (*)    MCV 105.2 (*)    All other components within normal limits  BASIC METABOLIC PANEL - Abnormal; Notable for the following:    Potassium 3.4 (*)    Calcium 8.2 (*)    GFR calc non Af Amer 79 (*)    All other components within normal limits  POCT PREGNANCY, URINE   Imaging Review Dg Chest 2 View  11/04/2012   *RADIOLOGY REPORT*  Clinical Data: To our last body aches and headache  CHEST - 2 VIEW  Comparison: Chest radiograph 04/17/2006  Findings: Heart size is within normal limits.  Mediastinal and hilar contours are stable.  The lungs are mildly hyperinflated, with flattening of the hemidiaphragm.  There is slight chronic appearing peribronchial thickening.  No airspace disease, effusion, or pneumothorax.  The trachea is midline.  No acute osseous abnormality.  IMPRESSION: Findings suggestive of chronic COPD.  No acute superimposed abnormality identified.   Original Report Authenticated By: Britta Mccreedy, M.D.    MDM   47 year old female with multiple complaints including diffuse aching  body pain, concern for pneumonia and concern for UTI.  She is currently on ciprofloxacin for a UTI diagnosed by her PCP. Her symptoms have improved, but she still reports some urinary frequency.  She reports that she was told in the 1980s that she had staph infection in her lungs. She was to be checked for this and thinks she may have a pneumonia. She also reports that her fibromyalgia is "acting up". Exam is reassuring. Vital signs are all stable and within normal limits. Doubt serious illnesses well-appearing female with a reassuring exam. Will screen with CBC, BMP, chest x-ray, urine studies. Normal saline bolus and Toradol.  10:45 PM Workup is reassuring.  She began requesting additional pain medication. I informed her that we had administered pain medication form of Toradol and that we would  happily administer tylenol. She then began requesting narcotics. I informed her that based on the workup we did not find a reason that she would need IV narcotic therapy. She became angered at this point. She also began reporting a headache. This is a similar headache to prior headaches that she has had. She was given Reglan for this. Soon after her discussion she requested discharge. Return precautions were reviewed with the patient. She was discharged in good condition and ambulated from the emergency department with no issue.  Clinical Impression: 1. Body aches     Disposition: Discharge  Condition: Good  I have discussed the results, Dx and Tx plan. They expressed understanding and agree with the plan and were told to return to ED with any worsening of condition or concern.    Discharge Medication List as of 11/04/2012 10:40 PM      Follow Up: [facility] [address] [place] [address] [phone]     Pt seen in conjunction with Dr. Jodi Mourning.  Reine Just. Beverely Pace, MD Emergency Medicine PGY-III [phone]    Oleh Genin, MD 11/05/12 [phone]  Medical  screening examination/treatment/procedure(s) were conducted as a shared visit with non-physician practitioner(s) or resident  and myself.  I personally evaluated the patient during the encounter and agree with the findings and plan unless otherwise indicated.    Diffuse body aches.  Work up in ED unremarkable.  Well appearing no distress.  Neuro intact.  Fup outpt.   Enid Skeens, MD 11/06/12 2152

## 2012-11-04 NOTE — ED Notes (Signed)
Pt arrived from home via [facility] c/o generalized body aches x 2 weeks, HA x 3 days. Diagnosed with Sinus infection, strep throat, and bronchitis. Presribed medications and has four days left. States she is tired of body aches

## 2013-04-06 ENCOUNTER — Emergency Department (HOSPITAL_COMMUNITY)
Admission: EM | Admit: 2013-04-06 | Discharge: 2013-04-06 | Disposition: A | Discharge status: Home / Self Care | Payer: Medicare Other | Attending: Emergency Medicine | Admitting: Emergency Medicine

## 2013-04-06 ENCOUNTER — Encounter (HOSPITAL_COMMUNITY): Payer: Self-pay | Admitting: Emergency Medicine

## 2013-04-06 ENCOUNTER — Emergency Department (HOSPITAL_COMMUNITY): Payer: Medicare Other

## 2013-04-06 DIAGNOSIS — F411 Generalized anxiety disorder: Secondary | ICD-10-CM | POA: Insufficient documentation

## 2013-04-06 DIAGNOSIS — G43909 Migraine, unspecified, not intractable, without status migrainosus: Secondary | ICD-10-CM | POA: Insufficient documentation

## 2013-04-06 DIAGNOSIS — F319 Bipolar disorder, unspecified: Secondary | ICD-10-CM | POA: Insufficient documentation

## 2013-04-06 DIAGNOSIS — H538 Other visual disturbances: Secondary | ICD-10-CM | POA: Insufficient documentation

## 2013-04-06 DIAGNOSIS — Z79899 Other long term (current) drug therapy: Secondary | ICD-10-CM | POA: Insufficient documentation

## 2013-04-06 DIAGNOSIS — M797 Fibromyalgia: Secondary | ICD-10-CM

## 2013-04-06 DIAGNOSIS — I10 Essential (primary) hypertension: Secondary | ICD-10-CM | POA: Insufficient documentation

## 2013-04-06 DIAGNOSIS — F209 Schizophrenia, unspecified: Secondary | ICD-10-CM | POA: Insufficient documentation

## 2013-04-06 DIAGNOSIS — H9319 Tinnitus, unspecified ear: Secondary | ICD-10-CM

## 2013-04-06 DIAGNOSIS — M791 Myalgia, unspecified site: Secondary | ICD-10-CM

## 2013-04-06 DIAGNOSIS — Z8719 Personal history of other diseases of the digestive system: Secondary | ICD-10-CM | POA: Insufficient documentation

## 2013-04-06 DIAGNOSIS — F17200 Nicotine dependence, unspecified, uncomplicated: Secondary | ICD-10-CM | POA: Insufficient documentation

## 2013-04-06 DIAGNOSIS — IMO0001 Reserved for inherently not codable concepts without codable children: Secondary | ICD-10-CM | POA: Insufficient documentation

## 2013-04-06 HISTORY — DX: Unspecified convulsions: R56.9

## 2013-04-06 LAB — COMPREHENSIVE METABOLIC PANEL
ALBUMIN: 3.9 g/dL (ref 3.5–5.2)
ALK PHOS: 74 U/L (ref 39–117)
ALT: 28 U/L (ref 0–35)
AST: 37 U/L (ref 0–37)
BUN: 9 mg/dL (ref 6–23)
CO2: 23 mEq/L (ref 19–32)
Calcium: 8.9 mg/dL (ref 8.4–10.5)
Chloride: 106 mEq/L (ref 96–112)
Creatinine, Ser: 0.81 mg/dL (ref 0.50–1.10)
GFR calc Af Amer: >90 mL/min (ref >90)
GFR calc non Af Amer: 84 mL/min — ABNORMAL LOW (ref >90)
Glucose, Bld: 85 mg/dL (ref 70–99)
POTASSIUM: 3.8 meq/L (ref 3.7–5.3)
SODIUM: 144 meq/L (ref 137–147)
TOTAL PROTEIN: 7.2 g/dL (ref 6.0–8.3)
Total Bilirubin: <0.2 mg/dL — ABNORMAL LOW (ref 0.3–1.2)

## 2013-04-06 LAB — CBC
HCT: 39.9 % (ref 36.0–46.0)
Hemoglobin: 12.8 g/dL (ref 12.0–15.0)
MCH: 33.4 pg (ref 26.0–34.0)
MCHC: 32.1 g/dL (ref 30.0–36.0)
MCV: 104.2 fL — ABNORMAL HIGH (ref 78.0–100.0)
PLATELETS: 202 10*3/uL (ref 150–400)
RBC: 3.83 MIL/uL — ABNORMAL LOW (ref 3.87–5.11)
RDW: 13.4 % (ref 11.5–15.5)
WBC: 5.7 10*3/uL (ref 4.0–10.5)

## 2013-04-06 LAB — SALICYLATE LEVEL: Salicylate Lvl: 8.8 mg/dL (ref 2.8–20.0)

## 2013-04-06 LAB — ACETAMINOPHEN LEVEL

## 2013-04-06 MED ORDER — PAROXETINE HCL 20 MG PO TABS: 20.0000 mg | ORAL_TABLET | Freq: Every day | ORAL | Status: DC

## 2013-04-06 MED ORDER — TRAMADOL HCL 50 MG PO TABS
50.0000 mg | ORAL_TABLET | Freq: Once | ORAL | Status: AC
  Administered 2013-04-06: 50 mg via ORAL
  Filled 2013-04-06: qty 1

## 2013-04-06 MED ORDER — ALPRAZOLAM 0.5 MG PO TABS: 0.5000 mg | ORAL_TABLET | Freq: Three times a day (TID) | ORAL | Status: DC | PRN

## 2013-04-06 MED ORDER — TRAMADOL HCL 50 MG PO TABS: 50.0000 mg | ORAL_TABLET | Freq: Four times a day (QID) | ORAL | Status: DC | PRN

## 2013-04-06 NOTE — ED Notes (Signed)
PT comfortable with d/c and f/u instructions. Prescriptions x3. Bus pass given to patient. Resource guide reviewed and given to pt.

## 2013-04-06 NOTE — ED Provider Notes (Signed)
CSN: [id number]     Arrival date & time 04/06/13  1345 History   First MD Initiated Contact with Patient 04/06/13 1513     Chief Complaint  Patient presents with  . Tinnitus     (Consider location/radiation/quality/duration/timing/severity/associated sxs/prior Treatment) The history is provided by the patient.  pt with hx bipolar disorder, anxiety, fibromyalgia, migraines, seizures, who presents stating she has been having tinnitus intermittently in past few weeks. Bilateral ears. No specific exacerbating or alleviating factors. Denies ear pain or injury to ear. No drainage. Hearing loss or muffled hearing.  Pt denies hx same. No headaches. States entire head feels numb/tingling, no unilateral numbness. No facial weakness. No extremity numbness/weakness. Pt states at times vision seems diffusely blurry bilaterally, not associated w tinnitus. Also states she feels she had seizure 3 days ago as when awoke at night at touched body/arms/legs w finger tip, it caused her pain w palpation.  Denies incontinence. No oral injury. No witnessed seizure activity.  Also notes hx anxiety, and states out of her paxil and xanax in past 2-3 weeks.  States tolerated meds well and felt they helped her, but now out for 2-3 weeks. Denies depression. States is eating and drinking normally. Attending to normal adls. Does not recent stress w recent move into new mobile home.  Pt denies other change in meds. No recent febrile illness. No wt change.  States takes otc tylenol and asa, but denies overdose or overuse.        Past Medical History  Diagnosis Date  . Bipolar 1 disorder   . Schizophrenia   . Anxiety   . Fibromyalgia   . GERD (gastroesophageal reflux disease)   . Hypertension   . Migraine   . Seizures    Past Surgical History  Procedure Laterality Date  . Cesarean section    . Mouth surgery  2014   No family history on file. History  Substance Use Topics  . Smoking status: Current Every Day Smoker  -- 2.00 packs/day for 15 years    Types: Cigarettes  . Smokeless tobacco: Never Used  . Alcohol Use: No   OB History   Grav Para Term Preterm Abortions TAB SAB Ect Mult Living                 Review of Systems  Constitutional: Negative for fever and chills.  HENT: Positive for tinnitus. Negative for congestion, ear discharge, ear pain, hearing loss and sore throat.   Eyes: Negative for pain and redness.  Respiratory: Negative for cough and shortness of breath.   Cardiovascular: Negative for chest pain and leg swelling.  Gastrointestinal: Negative for vomiting, abdominal pain and diarrhea.  Genitourinary: Negative for dysuria and flank pain.  Musculoskeletal: Negative for back pain and neck pain.  Skin: Negative for rash.  Neurological: Negative for dizziness, syncope, speech difficulty, weakness and headaches.  Hematological: Does not bruise/bleed easily.  Psychiatric/Behavioral: Negative for confusion. The patient is nervous/anxious.       Allergies  Prednisone; Claritin; Nsaids; and Sulfa antibiotics  Home Medications   Current Outpatient Rx  Name  Route  Sig  Dispense  Refill  . acetaminophen (TYLENOL) 325 MG tablet   Oral   Take 1,300 mg by mouth every 6 (six) hours as needed.          . ALPRAZolam (XANAX) 0.5 MG tablet   Oral   Take 0.5 mg by mouth 4 (four) times daily as needed for sleep or anxiety.         Marland Kitchen  Aspirin-Acetaminophen (GOODYS BODY PAIN PO)   Oral   Take 3 Packages by mouth every 6 (six) hours as needed (pain).         Marland Kitchen. aspirin-acetaminophen-caffeine (EXCEDRIN MIGRAINE) 250-250-65 MG per tablet   Oral   Take 3 tablets by mouth every 8 (eight) hours as needed for pain.         . hydrochlorothiazide (HYDRODIURIL) 25 MG tablet   Oral   Take 1 tablet (25 mg total) by mouth daily. For HTN   30 tablet   0   . ibuprofen (ADVIL,MOTRIN) 200 MG tablet   Oral   Take 800 mg by mouth every 6 (six) hours as needed for pain.         Marland Kitchen.  PARoxetine (PAXIL) 20 MG tablet   Oral   Take 20 mg by mouth daily.         . QUEtiapine (SEROQUEL) 300 MG tablet   Oral   Take 600 mg by mouth at bedtime.         . verapamil (CALAN) 40 MG tablet   Oral   Take 40 mg by mouth daily.          BP 141/87  Pulse 73  Temp(Src) 98.2 F (36.8 C) (Oral)  Resp 18  Wt 150 lb (68.04 kg)  SpO2 100% Physical Exam  Nursing note and vitals reviewed. Constitutional: She is oriented to person, place, and time. She appears well-developed and well-nourished. No distress.  HENT:  Head: Atraumatic.  Nose: Nose normal.  Mouth/Throat: Oropharynx is clear and moist.  eacs clear, tms normal.  No mastoid tenderness.   Eyes: Conjunctivae and EOM are normal. Pupils are equal, round, and reactive to light. No scleral icterus.  Neck: Neck supple. No tracheal deviation present. No thyromegaly present.  No bruit  Cardiovascular: Normal rate, regular rhythm, normal heart sounds and intact distal pulses.  Exam reveals no gallop and no friction rub.   No murmur heard. Pulmonary/Chest: Effort normal and breath sounds normal. No respiratory distress.  Abdominal: Soft. Normal appearance and bowel sounds are normal. She exhibits no distension. There is no tenderness.  Genitourinary:  No cva tenderness  Musculoskeletal: She exhibits no edema and no tenderness.  Neurological: She is alert and oriented to person, place, and time. No cranial nerve deficit.  Hearing grossly intact bil, to both voice, as well as very faint noises/lightly rubbing fingers.   Cr n intact. No facial asymmetry.   Motor intact bil, stre 5/5.   No pronator drift. Finger to nose intact.  Steady gait. Normal coordination.   Skin: Skin is warm and dry. No rash noted.  Psychiatric: She has a normal mood and affect.    ED Course  Procedures (including critical care time)   Results for orders placed during the hospital encounter of 04/06/13  CBC      Result Value Ref Range    WBC 5.7  4.0 - 10.5 K/uL   RBC 3.83 (*) 3.87 - 5.11 MIL/uL   Hemoglobin 12.8  12.0 - 15.0 g/dL   HCT 29.539.9  62.136.0 - 30.846.0 %   MCV 104.2 (*) 78.0 - 100.0 fL   MCH 33.4  26.0 - 34.0 pg   MCHC 32.1  30.0 - 36.0 g/dL   RDW 65.713.4  84.611.5 - 96.215.5 %   Platelets 202  150 - 400 K/uL  COMPREHENSIVE METABOLIC PANEL      Result Value Ref Range   Sodium 144  137 - 147 mEq/L  Potassium 3.8  3.7 - 5.3 mEq/L   Chloride 106  96 - 112 mEq/L   CO2 23  19 - 32 mEq/L   Glucose, Bld 85  70 - 99 mg/dL   BUN 9  6 - 23 mg/dL   Creatinine, Ser 4.09  0.50 - 1.10 mg/dL   Calcium 8.9  8.4 - 81.1 mg/dL   Total Protein 7.2  6.0 - 8.3 g/dL   Albumin 3.9  3.5 - 5.2 g/dL   AST 37  0 - 37 U/L   ALT 28  0 - 35 U/L   Alkaline Phosphatase 74  39 - 117 U/L   Total Bilirubin <0.2 (*) 0.3 - 1.2 mg/dL   GFR calc non Af Amer 84 (*) >90 mL/min   GFR calc Af Amer >90  >90 mL/min  ACETAMINOPHEN LEVEL      Result Value Ref Range   Acetaminophen (Tylenol), Serum <15.0  10 - 30 ug/mL  SALICYLATE LEVEL      Result Value Ref Range   Salicylate Lvl 8.8  2.8 - 20.0 mg/dL   Ct Head Wo Contrast  04/06/2013   CLINICAL DATA:  Possible seizures 3 nights ago. Bilateral tinnitus. Blurred vision and double vision. Prior history of seizures and stroke.  EXAM: CT HEAD WITHOUT CONTRAST  TECHNIQUE: Contiguous axial images were obtained from the base of the skull through the vertex without intravenous contrast.  COMPARISON:  CT HEAD W/O CM dated 09/10/2011; CT HEAD W/O CM dated 09/08/2011; MR HEAD W/O CM dated 12/27/2003; MR MRA HEAD W/O CM dated 12/27/2003; CT HEAD W/O CM dated 07/07/2003; CT HEAD W/O CM dated 01/09/2003  FINDINGS: Ventricular system normal in size and appearance for age. Slight asymmetry in the lateral ventricles, left larger than right, unchanged and felt to be developmental. Old lacunar stroke in the left thalamus, unchanged. No mass lesion. No midline shift. No acute hemorrhage or hematoma. No extra-axial fluid collections. No  evidence of acute infarction. No significant interval change dating back to 2004.  No focal osseous abnormality involving the skull. Mild mucosal thickening involving the bases of the maxillary sinuses and mucosal thickening involving a left anterior ethmoid air cell and the left frontal sinus. Remaining visualized paranasal sinuses, bilateral mastoid air cells, and bilateral middle ear cavities well aerated. Mild carotid siphon atherosclerosis.  IMPRESSION: 1. No acute intracranial abnormality. 2. Stable lacunar stroke involving the left thalamus. 3. Mild chronic bilateral maxillary, left anterior ethmoid and left frontal sinus disease.   Electronically Signed   By: Hulan Saas M.D.   On: 04/06/2013 16:47      MDM  Iv ns. Labs. Ct.  Reviewed nursing notes and prior charts for additional history.   Labs and head ct unremarkable for acute process. Pt states took asa for body aches/gen pain, but denies overuse/overdose.   Recheck ambulatory about ed.  Pt appears comfortable. No focal neuro findings on exam and appears stable for d/c.    Pt requests referral to neuro re hx seizures, ?recent seizure, and tinnitus.   Pt requests refill of her paxil, xanax and pain med - discussed will provide small quantity rx, and needs to get future refills by pcp.     Suzi Roots, MD 04/06/13 [phone]

## 2013-04-06 NOTE — ED Notes (Signed)
Pt states she has a hx of seizures and has been having ringing in her ears. States she thinks four nights ago she had a seizure in her sleep. Her muscles were sore. Ringing in the left ear is worse.

## 2013-04-06 NOTE — ED Notes (Addendum)
Pt also states she is out of her xanax and her paxil

## 2013-04-06 NOTE — Discharge Instructions (Signed)
Avoid taking excedrin, goodys, or other aspirin containing medication. Take your paxil as prescribed. You may take xanax as need for anxiety - no driving when taking. You may take ultram as need for pain - no driving when taking. Follow up with primary care doctor in coming week. For your seizures and ringing in ears, follow up with neurologist in the next 1-2 weeks - see referral, call office to arrange appointment. Return to ER if worse, new symptoms, severe dizziness, hearing loss, one-sided numbness/weakness, fevers, other concern.   You were given pain medication in the ER - no driving for the next 6 hours.    Myalgia, Adult Myalgia is the medical term for muscle pain. It is a symptom of many things. Nearly everyone at some time in their life has this. The most common cause for muscle pain is overuse or straining and more so when you are not in shape. Injuries and muscle bruises cause myalgias. Muscle pain without a history of injury can also be caused by a virus. It frequently comes along with the flu. Myalgia not caused by muscle strain can be present in a large number of infectious diseases. Some autoimmune diseases like lupus and fibromyalgia can cause muscle pain. Myalgia may be mild, or severe. SYMPTOMS  The symptoms of myalgia are simply muscle pain. Most of the time this is short lived and the pain goes away without treatment. DIAGNOSIS  Myalgia is diagnosed by your caregiver by taking your history. This means you tell him when the problems began, what they are, and what has been happening. If this has not been a long term problem, your caregiver may want to watch for a while to see what will happen. If it has been long term, they may want to do additional testing. TREATMENT  The treatment depends on what the underlying cause of the muscle pain is. Often anti-inflammatory medications will help. HOME CARE INSTRUCTIONS  If the pain in your muscles came from overuse, slow down your  activities until the problems go away.  Myalgia from overuse of a muscle can be treated with alternating hot and cold packs on the muscle affected or with cold for the first couple days. If either heat or cold seems to make things worse, stop their use.  Apply ice to the sore area for 15-20 minutes, 03-04 times per day, while awake for the first 2 days of muscle soreness, or as directed. Put the ice in a plastic bag and place a towel between the bag of ice and your skin.  Only take over-the-counter or prescription medicines for pain, discomfort, or fever as directed by your caregiver.  Regular gentle exercise may help if you are not active.  Stretching before strenuous exercise can help lower the risk of myalgia. It is normal when beginning an exercise regimen to feel some muscle pain after exercising. Muscles that have not been used frequently will be sore at first. If the pain is extreme, this may mean injury to a muscle. SEEK MEDICAL CARE IF:  You have an increase in muscle pain that is not relieved with medication.  You begin to run a temperature.  You develop nausea and vomiting.  You develop a stiff and painful neck.  You develop a rash.  You develop muscle pain after a tick bite.  You have continued muscle pain while working out even after you are in good condition. SEEK IMMEDIATE MEDICAL CARE IF: Any of your problems are getting worse and medications are  not helping. MAKE SURE YOU:   Understand these instructions.  Will watch your condition.  Will get help right away if you are not doing well or get worse. Document Released: 12/24/2005 Document Revised: 04/26/2011 Document Reviewed: 03/15/2006 [facility] Patient Information 2014 [place], [place].       Emergency Department Resource Guide 1) Find a Doctor and Pay Out of Pocket Although you won't have to find out who is covered by your insurance plan, it is a good idea to ask around and get recommendations. You will  then need to call the office and see if the doctor you have chosen will accept you as a new patient and what types of options they offer for patients who are self-pay. Some doctors offer discounts or will set up payment plans for their patients who do not have insurance, but you will need to ask so you aren't surprised when you get to your appointment.  2) Contact Your Local Health Department Not all health departments have doctors that can see patients for sick visits, but many do, so it is worth a call to see if yours does. If you don't know where your local health department is, you can check in your phone book. The CDC also has a tool to help you locate your state's health department, and many state websites also have listings of all of their local health departments.  3) Find a Walk-in Clinic If your illness is not likely to be very severe or complicated, you may want to try a walk in clinic. These are popping up all over the country in pharmacies, drugstores, and shopping centers. They're usually staffed by nurse practitioners or physician assistants that have been trained to treat common illnesses and complaints. They're usually fairly quick and inexpensive. However, if you have serious medical issues or chronic medical problems, these are probably not your best option.  No Primary Care Doctor: - Call Health Connect at  [phone] - they can help you locate a primary care doctor that  accepts your insurance, provides certain services, etc. - Physician Referral Service- [phone]  Chronic Pain Problems: Organization         Address  Phone   Notes  [facility]  [phone] Patients need to be referred by their primary care doctor.   Medication Assistance: Organization         Address  Phone   Notes  [facility] Med[facility] [address] [place] [place], [place] [address] [phone] --Must be a resident of [facility] -- Must have NO  insurance coverage whatsoever (no Medicaid/ Medicare, etc.) -- The pt. MUST have a primary care doctor that directs their care regularly and follows them in the community   [facility]  [phone]   [employer]  [phone]    Agencies that provide inexpensive medical care: Organization         Address  Phone   Notes  [facility]  [phone]   [facility]    [phone]   [facility] [address] [place], [place] [address] [phone]   [facility] [address] [place], [place] [phone]   Planned Parenthood    [phone]   [facility]    [phone]   [facility]  [address], [place] Phone:  [phone], Fax:  [phone] Hours of Operation:  9  am - 6 pm, M-F.  Also accepts Medicaid/Medicare and self-pay.  [facility]  [address], [place] Phone: [phone], Fax: [phone]. Hours of Operation:  8:30 am - 5:30 pm, M-F.  Also accepts Medicaid and self-pay.  [facility] [place] [address] [place] Phone: [phone]   [facility] [address] Natasha Bence [place], [place] [phone], Ext. [phone] Mondays & Thursdays: 7-9 AM.  First 15 patients are seen on a first come, first serve basis.    Medicaid-accepting [facility] Providers:  Organization         Address  Phone   Notes  [facility] [address], [place] [phone] Also accepts self-pay patients.  [facility] [address] Laurell Josephs [place], [place]  [phone]   [facility] [address], [place] [phone]   [facility] [address] [place] [phone]   Renaye Rakers [address], [place]   [phone] Only accepts [place] Access [place] patients after they have  their name applied to their card.   Self-Pay (no insurance) in [facility]:  Organization         Address  Phone   Notes  Sickle Cell Patients, [facility] [address] [place], [place] [phone]   [facility] [address] [place], [place] [phone]   [facility] [place]  [address], [place] [phone]   [facility]/Dr. Osei-Bonsu  [address] [place] or [address], [place] [phone] Phone number for both [place] and [place] locations is the same.  [facility] [address] [place] [phone]   [facility] [address] [place] or [address] [phone]   [facility] [address] [place] [phone], phone; [phone], fax Sees patients 1st and 3rd Saturday of every month.  Must not qualify for public or private insurance (i.e. Medicaid, Medicare, [place] Health Choice, Veterans' Benefits)  Household income should be no more than 200% of the poverty level The clinic cannot treat you if you are pregnant or think you are pregnant  Sexually transmitted diseases are not treated at the clinic.    Dental Care: Organization         Address  Phone  Notes  [facility] Department of [facility] [address] [place], [place] [phone] Accepts children up to age 38 who are enrolled in [place] or [place] Health Choice; pregnant women with a Medicaid card; and children who have applied for Medicaid or [place] Health Choice, but were declined, whose parents can pay a reduced fee at time of service.  [facility] of [facility]  [address], [place] [phone] Accepts children up to age 33 who are enrolled in [place] or [place] Health Choice; pregnant women with a Medicaid card; and children who have applied  for Medicaid or [place] Health Choice, but were declined, whose parents can pay a reduced fee at time of service.  [facility]  [address] [place], [place] [phone] Patients are seen by appointment only. Walk-ins are not accepted. [facility] will see patients 18 years of  age and older. Monday - Tuesday (8am-5pm) Most Wednesdays (8:30-5pm) $30 per visit, cash only  [facility]  [address], [facility] [place] [phone] Patients are seen by appointment only. Walk-ins are not accepted. [facility] will see patients 49 years of age and older. One Wednesday Evening (Monthly: Volunteer Based).  $30 per visit, cash only  [employer] [facility]  [phone] for adults; Children under age 284, call Graduate Pediatric Dentistry at [phone]. Children aged 574-14, please call [phone] to request a pediatric application.  Dental services are provided in all areas of dental care including fillings, crowns and bridges, complete and partial dentures, implants, gum treatment, root canals, and extractions. Preventive care is also provided. Treatment is provided to both adults and children. Patients are selected via a lottery and there is often a waiting list.   [facility] [address], [place]  [phone] [email or web address]   [facility] [address], [place], [place] [phone], Ext. [phone] Second and Fourth Thursday of each month, opens at 6:30 AM; Clinic ends at 9 AM.  Patients are seen on a first-come first-served basis, and a limited number are seen during each clinic.   [facility]  [address], [place], [place] [phone]   Eligibility Requirements You must have lived in [place], [place], or [place] counties for at least the last three months.   You cannot be eligible for state or federal sponsored [place]healthcare insurance, including CIG[employer]eterans Administration,  [place]icaid, or [employer]icare.   You generally cannot be eligible for healthcare insurance through your employer.    How to apply: Eligibility screenings are held every Tuesday and Wednesday afternoon from 1:00 pm until 4:00 pm. You do not need an appointment for the interview!  [facility] [address], [place], [place] [phone]   [facility]  [phone]   [facility]  [phone]   [facility]  [phone]    Behavioral Health Resources in the Community: Intensive Outpatient Programs Organization         Address  Phone  Notes  [facility] [address], [place], [place] [phone]   [facility] [address], [place], [place] [phone]   [facility] [address], [place], [place]  [phone]   [facility] [address],  [place], [place] [phone] or [phone]   Substance Abuse Resources Organization         Address  Phone  Notes  [facility]  [phone]   [facility]  [phone]   [facility]  [phone]   [facility]  [phone]   Residential & Outpatient Substance Abuse Program  [phone]   Psychological Services Organization         Address  Phone  Notes  [facility]  [phone]   [facility]  [phone]- [phone]   [facility] [address], [place] [phone] or [phone]    Mobile Crisis Teams Organization         Address  Phone  Notes  [facility], Mobile Crisis Care Unit  [phone]   [facility]  [address] [place], [place] [phone]   [facility] [address] [place] [phone]    Self-Help/Support Groups Organization         Address  Phone             Notes  [facility] [place] - variety of support  groups  336- I7437963 Call for more information  Narcotics Anonymous (NA), [facility] [address], [employer] [place]  2 meetings at this location   Statistician         Address  Phone  Notes  [facility] [address],    [place]  [phone]   [facility]  [address], [place] [address], [place], [place] [phone]   [facility] [address] [place], [place] [phone] Admissions: 8am-3pm M-F  [facility] [address],    [place], [place] [phone]   [facility] [address] [place], [place], [place] [phone]   [facility] [address]  [place], [place] [phone]   [facility] - Intensive Outpatient [address] Laurell Josephs [address], [place], [place] [phone]   [facility] ([facility] [address] [place]  [place], [place] [phone] or [phone]   [facility] ([facility]) [address] [place], [place] [phone] Accepts Medicaid  [place] [facility] [address]  [place] [phone] Substance Abuse/Addiction Treatment   [facility] Organization         Address  Phone  Notes  [facility]  [phone]   Angie Fava, PhD [address] Ervin Knack [place], [place]   [phone] or [phone]   [facility]   [address] [place], [place] [phone]   [facility] [address], [place], [place] [phone] Insurance/Medicaid/sponsorship through [facility] [address]                                    [place], [place] [phone] Therapy/tele-psych/case  [facility] [address], [place] [phone]    Dr. Lolly Mustache  [phone]   [facility]  [employer] [facility] Dept. 1) [address], [place] 2) [address], [place] 3)   [address], [place] [phone]  [phone]   [facility] Child Abuse Hotline [phone] or [phone] (After Hours)

## 2013-04-22 ENCOUNTER — Emergency Department (HOSPITAL_COMMUNITY)
Admission: EM | Admit: 2013-04-22 | Discharge: 2013-04-22 | Disposition: A | Discharge status: Home / Self Care | Payer: Medicare Other | Attending: Emergency Medicine | Admitting: Emergency Medicine

## 2013-04-22 ENCOUNTER — Encounter (HOSPITAL_COMMUNITY): Payer: Self-pay | Admitting: Emergency Medicine

## 2013-04-22 DIAGNOSIS — F319 Bipolar disorder, unspecified: Secondary | ICD-10-CM | POA: Insufficient documentation

## 2013-04-22 DIAGNOSIS — Z8739 Personal history of other diseases of the musculoskeletal system and connective tissue: Secondary | ICD-10-CM | POA: Insufficient documentation

## 2013-04-22 DIAGNOSIS — F209 Schizophrenia, unspecified: Secondary | ICD-10-CM | POA: Insufficient documentation

## 2013-04-22 DIAGNOSIS — Z79899 Other long term (current) drug therapy: Secondary | ICD-10-CM | POA: Insufficient documentation

## 2013-04-22 DIAGNOSIS — G8929 Other chronic pain: Secondary | ICD-10-CM | POA: Insufficient documentation

## 2013-04-22 DIAGNOSIS — M549 Dorsalgia, unspecified: Secondary | ICD-10-CM

## 2013-04-22 DIAGNOSIS — I10 Essential (primary) hypertension: Secondary | ICD-10-CM | POA: Insufficient documentation

## 2013-04-22 DIAGNOSIS — F17200 Nicotine dependence, unspecified, uncomplicated: Secondary | ICD-10-CM | POA: Insufficient documentation

## 2013-04-22 DIAGNOSIS — F411 Generalized anxiety disorder: Secondary | ICD-10-CM | POA: Insufficient documentation

## 2013-04-22 DIAGNOSIS — Z8719 Personal history of other diseases of the digestive system: Secondary | ICD-10-CM | POA: Insufficient documentation

## 2013-04-22 DIAGNOSIS — Z3201 Encounter for pregnancy test, result positive: Secondary | ICD-10-CM | POA: Insufficient documentation

## 2013-04-22 MED ORDER — DIAZEPAM 5 MG PO TABS: 5.0000 mg | ORAL_TABLET | Freq: Two times a day (BID) | ORAL | Status: DC

## 2013-04-22 MED ORDER — HYDROCODONE-ACETAMINOPHEN 5-325 MG PO TABS
2.0000 | ORAL_TABLET | Freq: Once | ORAL | Status: AC
  Administered 2013-04-22: 2 via ORAL
  Filled 2013-04-22: qty 2

## 2013-04-22 MED ORDER — DIAZEPAM 5 MG PO TABS
ORAL_TABLET | ORAL | Status: AC
  Administered 2013-04-22: 5 mg via ORAL
  Filled 2013-04-22: qty 1

## 2013-04-22 MED ORDER — HYDROCODONE-ACETAMINOPHEN 5-325 MG PO TABS: 2.0000 | ORAL_TABLET | ORAL | Status: DC | PRN

## 2013-04-22 MED ORDER — KETOROLAC TROMETHAMINE 60 MG/2ML IM SOLN
60.0000 mg | Freq: Once | INTRAMUSCULAR | Status: DC
  Filled 2013-04-22: qty 2

## 2013-04-22 MED ORDER — DIAZEPAM 5 MG PO TABS
5.0000 mg | ORAL_TABLET | Freq: Once | ORAL | Status: AC
  Administered 2013-04-22 (×2): 5 mg via ORAL
  Filled 2013-04-22: qty 1

## 2013-04-22 NOTE — ED Notes (Signed)
Pt reports having fibromyalgia with pain all over but is having severe lower back pain, hx of herniated disc. Ambulatory at triage.

## 2013-04-22 NOTE — ED Provider Notes (Signed)
Medical screening examination/treatment/procedure(s) were performed by non-physician practitioner and as supervising physician I was immediately available for consultation/collaboration.   EKG Interpretation None        Junius ArgyleForrest S Shakina Choy, MD 04/22/13 2127

## 2013-04-22 NOTE — ED Notes (Signed)
Pt reported to pharm tech, that she took 4 goody powders, 4 excedrin, 4 ES ACET.  PA aware.

## 2013-04-22 NOTE — Discharge Instructions (Signed)
Take Vicodin and valium as needed for pain and muscle spasm. Apply heat to your back. Refer to attached documents for more information. Follow up with your doctor for further evaluation and management.

## 2013-04-22 NOTE — ED Provider Notes (Signed)
CSN: [id number]     Arrival date & time 04/22/13  1252 History  This chart was scribed for Emilia Beck, PA working with Junius Argyle, MD by Quintella Reichert, ED Scribe. This patient was seen in room TR11C/TR11C and the patient's care was started at 2:35 PM.   Chief Complaint  Patient presents with  . Back Pain    The history is provided by the patient. No language interpreter was used.    HPI Comments: [name] is a 48 y.o. female with h/o fibromyalgia and lumbar degenerative disc disease who presents to the Emergency Department complaining of chronic lower left-sided back pain.  Pt reports her pain is the same as her chronic pain which has been ongoing for many years.  It is worsened by deep breathing and occasionally radiates down her left leg.  She uses a hot washcloth but has not recently felt comfortable stretching her back and does not have a heating pad.  Pt has been seen for her pain recently by a PA at the [facility] who placed her on diclofenac sodium 75mg  (1 tablet 2x/day) and told her she may need an MRI and referral to pain management.  She is planning to proceed with this plan but states that the diclofenac sodium causes abdominal discomfort so she needs different pain medications to tide her over until her next appointment with this PA.  She is hoping that she can move her appointment up from 2 weeks from now to 5 days but she states "I need relief."  She reports she has been on muscle relaxants on the past with some relief.  Pt reports she has received MRIs in the past and has been diagnosed with a herniated disc, but her last MRI was several years ago.   Past Medical History  Diagnosis Date  . Bipolar 1 disorder   . Schizophrenia   . Anxiety   . Fibromyalgia   . GERD (gastroesophageal reflux disease)   . Hypertension   . Migraine   . Seizures     Past Surgical History  Procedure Laterality Date  . Cesarean section    . Mouth surgery  2014     History reviewed. No pertinent family history.   History  Substance Use Topics  . Smoking status: Current Every Day Smoker -- 2.00 packs/day for 15 years    Types: Cigarettes  . Smokeless tobacco: Never Used  . Alcohol Use: No    OB History   Grav Para Term Preterm Abortions TAB SAB Ect Mult Living                   Review of Systems  Musculoskeletal: Positive for back pain.  All other systems reviewed and are negative.      Allergies  Prednisone; Claritin; Nsaids; and Sulfa antibiotics  Home Medications   Current Outpatient Rx  Name  Route  Sig  Dispense  Refill  . acetaminophen (TYLENOL) 325 MG tablet   Oral   Take 1,300 mg by mouth every 6 (six) hours as needed.          . ALPRAZolam (XANAX) 0.5 MG tablet   Oral   Take 0.5 mg by mouth 4 (four) times daily as needed for sleep or anxiety.         . Aspirin-Acetaminophen (GOODYS BODY PAIN PO)   Oral   Take 3 Packages by mouth every 6 (six) hours as needed (pain).         Marland Kitchen  aspirin-acetaminophen-caffeine (EXCEDRIN MIGRAINE) 250-250-65 MG per tablet   Oral   Take 3 tablets by mouth every 8 (eight) hours as needed for pain.         . hydrochlorothiazide (HYDRODIURIL) 25 MG tablet   Oral   Take 1 tablet (25 mg total) by mouth daily. For HTN   30 tablet   0   . PARoxetine (PAXIL) 20 MG tablet   Oral   Take 20 mg by mouth daily.         . QUEtiapine (SEROQUEL) 300 MG tablet   Oral   Take 600 mg by mouth at bedtime.         . traMADol (ULTRAM) 50 MG tablet   Oral   Take 1 tablet (50 mg total) by mouth every 6 (six) hours as needed.   20 tablet   0    BP 131/75  Pulse 74  Temp(Src) 98.7 F (37.1 C) (Oral)  Resp 20  Ht 5\' 6"  (1.676 m)  Wt 148 lb 4 oz (67.246 kg)  BMI 23.94 kg/m2  SpO2 96%  Physical Exam  Nursing note and vitals reviewed. Constitutional: She is oriented to person, place, and time. She appears well-developed and well-nourished. No distress.  HENT:  Head:  Normocephalic and atraumatic.  Eyes: EOM are normal.  Neck: Neck supple. No tracheal deviation present.  Cardiovascular: Normal rate.   Pulmonary/Chest: Effort normal. No respiratory distress.  Musculoskeletal: Normal range of motion.  Left lumbar paraspinal tenderness to palpation.  No midline spine tenderness.  Neurological: She is alert and oriented to person, place, and time.  Lower extremity strength and sensation equal and intact.  Skin: Skin is warm and dry.  Psychiatric: She has a normal mood and affect. Her behavior is normal.    ED Course  Procedures (including critical care time)  DIAGNOSTIC STUDIES: Oxygen Saturation is 96% on room air, normal by my interpretation.    COORDINATION OF CARE: 2:52 PM-Discussed treatment plan which includes pain medication, muscle relaxants, and neurology f/u with pt at bedside and pt agreed to plan.     Labs Review Labs Reviewed - No data to display  Imaging Review No results found.   EKG Interpretation None      MDM   Final diagnoses:  Back pain    3:33 PM Patient has a lot of chronic pain and referred to pain management from her PCP. Patient is scheduled to have an MRI for back pain. No new symptoms today. Patient given vicodin for pain. No bladder/bowel incontinence or saddle paresthesias. Patient will be discharged with Vicodin and Valium and instructions to follow up with her PCP.     I personally performed the services described in this documentation, which was scribed in my presence. The recorded information has been reviewed and is accurate.    Emilia BeckKaitlyn Ella Guillotte, PA-C 04/22/13 1534

## 2013-04-22 NOTE — ED Notes (Signed)
Pt went to [facility] on [address], [place] last Friday.

## 2013-04-22 NOTE — ED Notes (Signed)
Pt seen in [facility] ED 04-06-13 for low back pain.  Was referred to neurology, pt has appt 3-30.  Friday pt went to see doctor because back pain was severe.  Pt was told she had scoliosis.  Pt here today because back pain is unbearable.  Pt was prescribed Diclofenac, pt unable to take med because it is causing abd pain and indigestion.

## 2013-04-24 ENCOUNTER — Encounter (HOSPITAL_COMMUNITY): Payer: Self-pay | Admitting: Emergency Medicine

## 2013-04-24 ENCOUNTER — Emergency Department (HOSPITAL_COMMUNITY): Payer: Medicare Other

## 2013-04-24 ENCOUNTER — Emergency Department (HOSPITAL_COMMUNITY)
Admission: EM | Admit: 2013-04-24 | Discharge: 2013-04-24 | Disposition: A | Discharge status: Home / Self Care | Payer: Medicare Other | Attending: Emergency Medicine | Admitting: Emergency Medicine

## 2013-04-24 DIAGNOSIS — F17200 Nicotine dependence, unspecified, uncomplicated: Secondary | ICD-10-CM | POA: Insufficient documentation

## 2013-04-24 DIAGNOSIS — Z79899 Other long term (current) drug therapy: Secondary | ICD-10-CM | POA: Insufficient documentation

## 2013-04-24 DIAGNOSIS — F411 Generalized anxiety disorder: Secondary | ICD-10-CM | POA: Insufficient documentation

## 2013-04-24 DIAGNOSIS — S91009A Unspecified open wound, unspecified ankle, initial encounter: Principal | ICD-10-CM

## 2013-04-24 DIAGNOSIS — I10 Essential (primary) hypertension: Secondary | ICD-10-CM | POA: Insufficient documentation

## 2013-04-24 DIAGNOSIS — K219 Gastro-esophageal reflux disease without esophagitis: Secondary | ICD-10-CM | POA: Insufficient documentation

## 2013-04-24 DIAGNOSIS — F209 Schizophrenia, unspecified: Secondary | ICD-10-CM | POA: Insufficient documentation

## 2013-04-24 DIAGNOSIS — IMO0001 Reserved for inherently not codable concepts without codable children: Secondary | ICD-10-CM | POA: Insufficient documentation

## 2013-04-24 DIAGNOSIS — Y92009 Unspecified place in unspecified non-institutional (private) residence as the place of occurrence of the external cause: Secondary | ICD-10-CM | POA: Insufficient documentation

## 2013-04-24 DIAGNOSIS — F319 Bipolar disorder, unspecified: Secondary | ICD-10-CM | POA: Insufficient documentation

## 2013-04-24 DIAGNOSIS — W208XXA Other cause of strike by thrown, projected or falling object, initial encounter: Secondary | ICD-10-CM | POA: Insufficient documentation

## 2013-04-24 DIAGNOSIS — S81819A Laceration without foreign body, unspecified lower leg, initial encounter: Secondary | ICD-10-CM

## 2013-04-24 DIAGNOSIS — Y93E9 Activity, other interior property and clothing maintenance: Secondary | ICD-10-CM | POA: Insufficient documentation

## 2013-04-24 DIAGNOSIS — S81809A Unspecified open wound, unspecified lower leg, initial encounter: Principal | ICD-10-CM

## 2013-04-24 DIAGNOSIS — S81009A Unspecified open wound, unspecified knee, initial encounter: Secondary | ICD-10-CM | POA: Insufficient documentation

## 2013-04-24 MED ORDER — HYDROCODONE-ACETAMINOPHEN 5-325 MG PO TABS
ORAL_TABLET | ORAL | Status: DC
Start: 2013-04-24 — End: 2013-05-23

## 2013-04-24 MED ORDER — ONDANSETRON 4 MG PO TBDP
4.0000 mg | ORAL_TABLET | Freq: Once | ORAL | Status: AC
  Administered 2013-04-24: 4 mg via ORAL
  Filled 2013-04-24: qty 1

## 2013-04-24 MED ORDER — TETANUS-DIPHTH-ACELL PERTUSSIS 5-2.5-18.5 LF-MCG/0.5 IM SUSP
0.5000 mL | Freq: Once | INTRAMUSCULAR | Status: AC
Start: 2013-04-24 — End: 2013-04-24
  Administered 2013-04-24: 0.5 mL via INTRAMUSCULAR
  Filled 2013-04-24: qty 0.5

## 2013-04-24 MED ORDER — HYDROMORPHONE HCL PF 2 MG/ML IJ SOLN
2.0000 mg | Freq: Once | INTRAMUSCULAR | Status: AC
  Administered 2013-04-24: 2 mg via INTRAMUSCULAR
  Filled 2013-04-24: qty 1

## 2013-04-24 MED ORDER — HYDROMORPHONE HCL PF 2 MG/ML IJ SOLN: 2.0000 mg | Freq: Once | INTRAMUSCULAR | Status: DC

## 2013-04-24 NOTE — ED Provider Notes (Signed)
CSN: [id number]     Arrival date & time 04/24/13  1613 History  This chart was scribed for non-physician practitioner working with Nelia Shiobert L Beaton, MD by Ashley JacobsBrittany Andrews, ED scribe. This patient was seen in room WTR1/WLPT1 and the patient's care was started at 4:21 PM.  None    Chief Complaint  Patient presents with  . Extremity Laceration     (Consider location/radiation/quality/duration/timing/severity/associated sxs/prior Treatment) The history is provided by the patient and medical records. No language interpreter was used.   HPI Comments: [name] is a 48 y.o. female who presents to the Emergency Department via EMS complaining of laceration to her right ankle after injury that occurred today. She was carrying a couch upstairs when the couch slid into the ventral aspect of her R leg. The site presents with constant moderate pain. Pt is able to bend and flex her toes normally. The bleeding is controlled at this time. Her tetanus shot is not UTD.   Past Medical History  Diagnosis Date  . Bipolar 1 disorder   . Schizophrenia   . Anxiety   . Fibromyalgia   . GERD (gastroesophageal reflux disease)   . Hypertension   . Migraine   . Seizures    Past Surgical History  Procedure Laterality Date  . Cesarean section    . Mouth surgery  2014   No family history on file. History  Substance Use Topics  . Smoking status: Current Every Day Smoker -- 2.00 packs/day for 15 years    Types: Cigarettes  . Smokeless tobacco: Never Used  . Alcohol Use: No   OB History   Grav Para Term Preterm Abortions TAB SAB Ect Mult Living                 Review of Systems  Constitutional: Negative for activity change.  Musculoskeletal: Positive for myalgias. Negative for arthralgias, back pain, joint swelling and neck pain.  Skin: Positive for wound (right leg large laceration to the ventral aspect).  Neurological: Negative for weakness and numbness.      Allergies  Prednisone;  Claritin; Nsaids; and Sulfa antibiotics  Home Medications   Current Outpatient Rx  Name  Route  Sig  Dispense  Refill  . acetaminophen (TYLENOL) 325 MG tablet   Oral   Take 1,300 mg by mouth every 6 (six) hours as needed.          . ALPRAZolam (XANAX) 0.5 MG tablet   Oral   Take 0.5 mg by mouth 4 (four) times daily as needed for sleep or anxiety.         . Aspirin-Acetaminophen (GOODYS BODY PAIN PO)   Oral   Take 3 Packages by mouth every 6 (six) hours as needed (pain).         Marland Kitchen. aspirin-acetaminophen-caffeine (EXCEDRIN MIGRAINE) 250-250-65 MG per tablet   Oral   Take 3 tablets by mouth every 8 (eight) hours as needed for pain.         . diazepam (VALIUM) 5 MG tablet   Oral   Take 1 tablet (5 mg total) by mouth 2 (two) times daily.   10 tablet   0   . hydrochlorothiazide (HYDRODIURIL) 25 MG tablet   Oral   Take 1 tablet (25 mg total) by mouth daily. For HTN   30 tablet   0   . HYDROcodone-acetaminophen (NORCO/VICODIN) 5-325 MG per tablet   Oral   Take 2 tablets by mouth every 4 (four) hours as needed.  14 tablet   0   . PARoxetine (PAXIL) 20 MG tablet   Oral   Take 20 mg by mouth daily.         . QUEtiapine (SEROQUEL) 300 MG tablet   Oral   Take 600 mg by mouth at bedtime.         . traMADol (ULTRAM) 50 MG tablet   Oral   Take 1 tablet (50 mg total) by mouth every 6 (six) hours as needed.   20 tablet   0    BP 132/72  Pulse 74  Temp(Src) 98.9 F (37.2 C) (Oral)  Resp 16  SpO2 100% Physical Exam  Nursing note and vitals reviewed. Constitutional: She appears well-developed and well-nourished.  HENT:  Head: Normocephalic and atraumatic.  Eyes: Pupils are equal, round, and reactive to light.  Neck: Normal range of motion. Neck supple.  Cardiovascular: Exam reveals no decreased pulses.   Pulses:      Dorsalis pedis pulses are 2+ on the right side, and 2+ on the left side.       Posterior tibial pulses are 2+ on the right side, and 2+ on  the left side.  Musculoskeletal: She exhibits tenderness. She exhibits no edema.       Right knee: Normal.       Right ankle: Normal.       Right lower leg: She exhibits laceration.       Right foot: Normal.  Neurological: She is alert. No sensory deficit.  Motor, sensation, and vascular distal to the injury is fully intact.   Skin: Skin is warm and dry.  Patient was approximately 8 cm C-shaped laceration (skin tear) to the anterior aspect of the right leg. Distal sensation, motor, vascular intact. Wound extends to the fascial layer but does not appear to disrupt the fascia of the anterior leg. Mild oozing. 3 small pieces of very small debris noted and removed with skin scrub. No other foreign bodies seen or palpated.  Psychiatric: She has a normal mood and affect.    ED Course  Procedures (including critical care time) DIAGNOSTIC STUDIES: Oxygen Saturation is 100% on room air, normal by my interpretation.    COORDINATION OF CARE:  4:25 PM Discussed course of care with pt which includes right leg laceration, dilaudid, Tetanus injection, and zofran-ODT. Pt understands and agrees.   Labs Review Labs Reviewed - No data to display Imaging Review Dg Ankle Complete Right  04/24/2013   CLINICAL DATA Lower leg laceration  EXAM RIGHT ANKLE - COMPLETE 3+ VIEW  COMPARISON None.  FINDINGS Anterior tibial lower leg soft tissue laceration noted. No radiopaque foreign body. No underlying fracture. Right distal tibia and fibula intact. Normal alignment at the ankle joint.  IMPRESSION Soft tissue injury compatible with laceration. No acute osseous finding or radiopaque foreign body  SIGNATURE  Electronically Signed   By: Ruel Favors M.D.   On: 04/24/2013 16:54     EKG Interpretation None      Vital signs reviewed and are as follows: Filed Vitals:   04/24/13 1900  BP: 118/101  Pulse: 73  Temp:   Resp: 20    LACERATION REPAIR Performed by: Carolee Rota Authorized by: Carolee Rota Consent: Verbal consent obtained. Risks and benefits: risks, benefits and alternatives were discussed Consent given by: patient Patient identity confirmed: provided demographic data Prepped and Draped in normal sterile fashion Wound explored  Laceration Location: R anterior leg  Laceration Length: 8cm  No Foreign Bodies seen  or palpated after skin scrub  Anesthesia: local infiltration  Local anesthetic: lidocaine 2% with epinephrine  Anesthetic total: 12 ml  Irrigation method: skin scrub with dermal cleaner Amount of cleaning: standard  Skin closure: 5-0 Ethilon  Number of sutures: 14  Technique: simple interrupted  Patient tolerance: Patient tolerated the procedure well with no immediate complications.  Patient counseled on wound care. Patient counseled on need to return or see PCP/urgent care for suture removal in 10-14 days. Patient was urged to return to the Emergency Department urgently with worsening pain, swelling, expanding erythema especially if it streaks away from the affected area, fever, or if they have any other concerns. Patient verbalized understanding.   Patient counseled on use of narcotic pain medications. Counseled not to combine these medications with others containing tylenol. Urged not to drink alcohol, drive, or perform any other activities that requires focus while taking these medications. The patient verbalizes understanding and agrees with the plan.   MDM   Final diagnoses:  Laceration of leg   Patient with large laceration of leg. Wound cleaned and repaired without difficulty. No tendon injury suspected. No significant vascular or neuro injury suspected. Patient has full range of motion of her foot and lower leg.  I personally performed the services described in this documentation, which was scribed in my presence. The recorded information has been reviewed and is accurate.     Renne Crigler, PA-C 04/24/13 2053

## 2013-04-24 NOTE — Discharge Instructions (Signed)
Please read and follow all provided instructions.  Your diagnoses today include:  1. Laceration of leg     Tests performed today include:  X-ray of the affected area that did not show any foreign bodies or broken bones  Vital signs. See below for your results today.   Medications prescribed:   Vicodin (hydrocodone/acetaminophen) - narcotic pain medication  DO NOT drive or perform any activities that require you to be awake and alert because this medicine can make you drowsy. BE VERY CAREFUL not to take multiple medicines containing Tylenol (also called acetaminophen). Doing so can lead to an overdose which can damage your liver and cause liver failure and possibly death.  Take any prescribed medications only as directed.   Home care instructions:  Follow any educational materials and wound care instructions contained in this packet.   Keep affected area above the level of your heart when possible to minimize swelling. Wash area gently twice a day with warm soapy water. Do not apply alcohol or hydrogen peroxide. Cover the area if it draining or weeping.   Follow-up instructions: Suture Removal: Return to the Emergency Department or see your primary care care doctor in 10-14 days for a recheck of your wound and removal of your sutures or staples.    If you do not have a primary care doctor -- see below for referral information.   Return instructions:  Return to the Emergency Department if you have:  Fever  Worsening pain  Worsening swelling of the wound  Pus draining from the wound  Redness of the skin that moves away from the wound, especially if it streaks away from the affected area   Any other emergent concerns  Your vital signs today were: BP 132/72   Pulse 74   Temp(Src) 98.9 F (37.2 C) (Oral)   Resp 16   SpO2 100% If your blood pressure (BP) was elevated above 135/85 this visit, please have this repeated by your doctor within one month. --------------

## 2013-04-24 NOTE — ED Notes (Signed)
Pt presents via EMS with c/o right leg laceration approx 2 inch long and 1.5 inches wide per EMS. Pt was carrying a couch up the steps and it fell onto her legs. Bleeding controlled at this time. PMS intact per EMS.

## 2013-04-25 ENCOUNTER — Telehealth (HOSPITAL_COMMUNITY): Payer: Self-pay

## 2013-04-25 NOTE — ED Notes (Signed)
Pharmacy called to inform pt had percocet 30 day supply filled on 04/16/13. per doctor Effie ShyWentz- shredd the norco prescription

## 2013-05-07 NOTE — ED Provider Notes (Signed)
Medical screening examination/treatment/procedure(s) were performed by non-physician practitioner and as supervising physician I was immediately available for consultation/collaboration.   Chaitanya Amedee L Clytee Heinrich, MD 05/07/13 1114 

## 2013-05-11 ENCOUNTER — Telehealth: Payer: Self-pay | Admitting: *Deleted

## 2013-05-11 NOTE — Telephone Encounter (Signed)
I spoke with patient this am at this number  [phone] which will be a working number for her now  We have changed her appt to 06/11/13 at 1:30pm so we can get her records she states she is seeing Dr Greggory StallionGeorge vonsu PCP now has seen Dr Boris LownMahon at [phone] and  Dr Ulla Potashomier as well she was ask to come to office to sign consent form so we can get her records released .

## 2013-05-14 ENCOUNTER — Ambulatory Visit: Payer: Self-pay | Admitting: Neurology

## 2013-05-15 ENCOUNTER — Encounter (HOSPITAL_BASED_OUTPATIENT_CLINIC_OR_DEPARTMENT_OTHER): Payer: Medicare Other | Attending: General Surgery

## 2013-05-23 ENCOUNTER — Emergency Department (HOSPITAL_COMMUNITY)
Admission: EM | Admit: 2013-05-23 | Discharge: 2013-05-24 | Disposition: A | Discharge status: Home / Self Care | Payer: Medicare Other | Attending: Emergency Medicine | Admitting: Emergency Medicine

## 2013-05-23 ENCOUNTER — Encounter (HOSPITAL_COMMUNITY): Payer: Self-pay | Admitting: Emergency Medicine

## 2013-05-23 ENCOUNTER — Encounter (HOSPITAL_BASED_OUTPATIENT_CLINIC_OR_DEPARTMENT_OTHER): Payer: Medicare Other | Attending: General Surgery

## 2013-05-23 ENCOUNTER — Other Ambulatory Visit: Payer: Self-pay

## 2013-05-23 DIAGNOSIS — R45851 Suicidal ideations: Secondary | ICD-10-CM

## 2013-05-23 DIAGNOSIS — T438X2A Poisoning by other psychotropic drugs, intentional self-harm, initial encounter: Secondary | ICD-10-CM

## 2013-05-23 DIAGNOSIS — Z8659 Personal history of other mental and behavioral disorders: Secondary | ICD-10-CM | POA: Insufficient documentation

## 2013-05-23 DIAGNOSIS — T424X4A Poisoning by benzodiazepines, undetermined, initial encounter: Secondary | ICD-10-CM | POA: Insufficient documentation

## 2013-05-23 DIAGNOSIS — I10 Essential (primary) hypertension: Secondary | ICD-10-CM | POA: Insufficient documentation

## 2013-05-23 DIAGNOSIS — F411 Generalized anxiety disorder: Secondary | ICD-10-CM | POA: Insufficient documentation

## 2013-05-23 DIAGNOSIS — L02419 Cutaneous abscess of limb, unspecified: Secondary | ICD-10-CM | POA: Insufficient documentation

## 2013-05-23 DIAGNOSIS — T50901A Poisoning by unspecified drugs, medicaments and biological substances, accidental (unintentional), initial encounter: Secondary | ICD-10-CM

## 2013-05-23 DIAGNOSIS — G43909 Migraine, unspecified, not intractable, without status migrainosus: Secondary | ICD-10-CM | POA: Insufficient documentation

## 2013-05-23 DIAGNOSIS — Z8719 Personal history of other diseases of the digestive system: Secondary | ICD-10-CM | POA: Insufficient documentation

## 2013-05-23 DIAGNOSIS — F17200 Nicotine dependence, unspecified, uncomplicated: Secondary | ICD-10-CM | POA: Insufficient documentation

## 2013-05-23 DIAGNOSIS — L0390 Cellulitis, unspecified: Secondary | ICD-10-CM

## 2013-05-23 DIAGNOSIS — Z79899 Other long term (current) drug therapy: Secondary | ICD-10-CM | POA: Insufficient documentation

## 2013-05-23 DIAGNOSIS — T43502A Poisoning by unspecified antipsychotics and neuroleptics, intentional self-harm, initial encounter: Secondary | ICD-10-CM | POA: Insufficient documentation

## 2013-05-23 DIAGNOSIS — F4321 Adjustment disorder with depressed mood: Secondary | ICD-10-CM

## 2013-05-23 DIAGNOSIS — L03119 Cellulitis of unspecified part of limb: Secondary | ICD-10-CM

## 2013-05-23 LAB — COMPREHENSIVE METABOLIC PANEL
ALK PHOS: 99 U/L (ref 39–117)
ALT: 14 U/L (ref 0–35)
AST: 23 U/L (ref 0–37)
Albumin: 3.7 g/dL (ref 3.5–5.2)
BUN: 9 mg/dL (ref 6–23)
CO2: 22 mEq/L (ref 19–32)
Calcium: 9 mg/dL (ref 8.4–10.5)
Chloride: 104 mEq/L (ref 96–112)
Creatinine, Ser: 0.74 mg/dL (ref 0.50–1.10)
GFR calc Af Amer: >90 mL/min (ref >90)
GFR calc non Af Amer: >90 mL/min (ref >90)
Glucose, Bld: 89 mg/dL (ref 70–99)
POTASSIUM: 4 meq/L (ref 3.7–5.3)
SODIUM: 143 meq/L (ref 137–147)
TOTAL PROTEIN: 7.4 g/dL (ref 6.0–8.3)
Total Bilirubin: 0.3 mg/dL (ref 0.3–1.2)

## 2013-05-23 LAB — CBC
HCT: 46 % (ref 36.0–46.0)
Hemoglobin: 15.2 g/dL — ABNORMAL HIGH (ref 12.0–15.0)
MCH: 34.3 pg — ABNORMAL HIGH (ref 26.0–34.0)
MCHC: 33 g/dL (ref 30.0–36.0)
MCV: 103.8 fL — ABNORMAL HIGH (ref 78.0–100.0)
PLATELETS: 237 10*3/uL (ref 150–400)
RBC: 4.43 MIL/uL (ref 3.87–5.11)
RDW: 14 % (ref 11.5–15.5)
WBC: 7.4 10*3/uL (ref 4.0–10.5)

## 2013-05-23 LAB — CBG MONITORING, ED: Glucose-Capillary: 116 mg/dL — ABNORMAL HIGH (ref 70–99)

## 2013-05-23 LAB — SALICYLATE LEVEL: Salicylate Lvl: 3.2 mg/dL (ref 2.8–20.0)

## 2013-05-23 LAB — ACETAMINOPHEN LEVEL: Acetaminophen (Tylenol), Serum: <15 ug/mL (ref 10–30)

## 2013-05-23 LAB — ETHANOL: Alcohol, Ethyl (B): 86 mg/dL — ABNORMAL HIGH (ref 0–11)

## 2013-05-23 MED ORDER — PAROXETINE HCL 20 MG PO TABS
20.0000 mg | ORAL_TABLET | Freq: Every day | ORAL | Status: DC
  Administered 2013-05-24: 20 mg via ORAL
  Filled 2013-05-23: qty 1

## 2013-05-23 MED ORDER — CLINDAMYCIN HCL 300 MG PO CAPS
300.0000 mg | ORAL_CAPSULE | Freq: Four times a day (QID) | ORAL | Status: DC
  Administered 2013-05-24 (×3): 300 mg via ORAL
  Filled 2013-05-23 (×6): qty 1

## 2013-05-23 MED ORDER — ACETAMINOPHEN 325 MG PO TABS
650.0000 mg | ORAL_TABLET | ORAL | Status: DC | PRN
  Administered 2013-05-24 (×2): 650 mg via ORAL
  Filled 2013-05-23 (×3): qty 2

## 2013-05-23 MED ORDER — HYDROCHLOROTHIAZIDE 25 MG PO TABS
25.0000 mg | ORAL_TABLET | Freq: Every day | ORAL | Status: DC
  Administered 2013-05-24: 25 mg via ORAL
  Filled 2013-05-23: qty 1

## 2013-05-23 MED ORDER — NICOTINE 21 MG/24HR TD PT24
21.0000 mg | MEDICATED_PATCH | Freq: Every day | TRANSDERMAL | Status: DC
  Administered 2013-05-24: 21 mg via TRANSDERMAL
  Filled 2013-05-23: qty 1

## 2013-05-23 MED ORDER — ALUM & MAG HYDROXIDE-SIMETH 200-200-20 MG/5ML PO SUSP: 30.0000 mL | ORAL | Status: DC | PRN

## 2013-05-23 MED ORDER — ONDANSETRON HCL 4 MG PO TABS: 4.0000 mg | ORAL_TABLET | Freq: Three times a day (TID) | ORAL | Status: DC | PRN

## 2013-05-23 NOTE — ED Notes (Signed)
Pt is aware that a urine sample is needed, but pt sts she is currently unable to go to the restroom.  Will notify NT or RN when ready.

## 2013-05-23 NOTE — ED Provider Notes (Addendum)
TIME SEEN: 10:25 PM  CHIEF COMPLAINT: Suicide ideation, intentional overdose  HPI: Patient is a 48 y.o. F with history of bipolar disorder, schizophrenia, hypertension, migraines, seizures, fibromyalgia, anxiety presents to the emergency department with intentional overdose. She states that she takes 13 1mg  Xanax tablets approximately 6 hours prior to arrival and attempt to hurt herself. She also drink alcohol. She states that she has been depressed because she has not been able to see her grandson. Denies HI. Is unclear if patient is having any hallucinations. She is also complaining of pain in her right lower extremity. She had the injury approximately 3 weeks ago that required sutures. She was never seen by another doctor for this and has not had her sutures removed. The wound has not plan with surrounding erythema it is painful. No purulent drainage. No fevers. No vomiting or diarrhea.  ROS: See HPI Constitutional: no fever  Eyes: no drainage  ENT: no runny nose   Cardiovascular:  no chest pain  Resp: no SOB  GI: no vomiting GU: no dysuria Integumentary: no rash  Allergy: no hives  Musculoskeletal: no leg swelling  Neurological: no slurred speech ROS otherwise negative  PAST MEDICAL HISTORY/PAST SURGICAL HISTORY:  Past Medical History  Diagnosis Date  . Bipolar 1 disorder   . Schizophrenia   . Anxiety   . Fibromyalgia   . GERD (gastroesophageal reflux disease)   . Hypertension   . Migraine   . Seizures     MEDICATIONS:  Prior to Admission medications   Medication Sig Start Date End Date Taking? Authorizing Provider  acetaminophen (TYLENOL) 325 MG tablet Take 1,300 mg by mouth every 6 (six) hours as needed for moderate pain.    Yes Historical Provider, MD  ALPRAZolam Prudy Feeler(XANAX) 1 MG tablet Take 1 mg by mouth 3 (three) times daily.   Yes Historical Provider, MD  Aspirin-Acetaminophen (GOODYS BODY PAIN PO) Take 4 Packages by mouth every 6 (six) hours as needed (pain).    Yes  Historical Provider, MD  aspirin-acetaminophen-caffeine (EXCEDRIN MIGRAINE) [phone] MG per tablet Take 2 tablets by mouth every 8 (eight) hours as needed for migraine.    Yes Historical Provider, MD  hydrochlorothiazide (HYDRODIURIL) 25 MG tablet Take 1 tablet (25 mg total) by mouth daily. For HTN 10/28/11  Yes Alyson Kuroski-Mazzei, DO  PARoxetine (PAXIL) 20 MG tablet Take 20 mg by mouth daily.   Yes Historical Provider, MD    ALLERGIES:  Allergies  Allergen Reactions  . Prednisone Shortness Of Breath and Swelling  . Claritin [Loratadine]     Unknown  . Nsaids Other (See Comments)    GERD  . Sulfa Antibiotics Swelling    SOCIAL HISTORY:  History  Substance Use Topics  . Smoking status: Current Every Day Smoker -- 2.00 packs/day for 15 years    Types: Cigarettes  . Smokeless tobacco: Never Used  . Alcohol Use: No    FAMILY HISTORY: History reviewed. No pertinent family history.  EXAM: BP 133/109  Pulse 97  Temp(Src) 98.8 F (37.1 C) (Oral)  Resp 18  SpO2 95% CONSTITUTIONAL: Alert and oriented and responds appropriately to questions. Well-appearing; well-nourished HEAD: Normocephalic EYES: Conjunctivae clear, PERRL ENT: normal nose; no rhinorrhea; moist mucous membranes; pharynx without lesions noted NECK: Supple, no meningismus, no LAD  CARD: RRR; S1 and S2 appreciated; no murmurs, no clicks, no rubs, no gallops RESP: Normal chest excursion without splinting or tachypnea; breath sounds clear and equal bilaterally; no wheezes, no rhonchi, no rales,  ABD/GI: Normal  bowel sounds; non-distended; soft, non-tender, no rebound, no guarding BACK:  The back appears normal and is non-tender to palpation, there is no CVA tenderness EXT: Normal ROM in all joints; non-tender to palpation; no edema; normal capillary refill; no cyanosis; 2+ radial pulses and DP pulses bilaterally, no posterior calf tenderness, extremity is warm and well-perfused, no joint effusion   SKIN: Normal  color for age and race; warm; 5 cm by 2 cm open area to the distal right anterior shin, wound is scabbed with 1 cm of surrounding erythema without induration, no purulent drainage NEURO: Moves all extremities equally PSYCH: The patient's mood and manner are appropriate. Grooming and personal hygiene are appropriate.  MEDICAL DECISION MAKING: Patient here with intentional overdose today. She denies HI but may have hallucinations (patient continually talking about Satan). She does appear slightly intoxicated on exam. She has a week, protecting her airway, neurologically intact. She has a wound to her right distal shin with minimal amount of surrounding cellulitis. No sign of abscess. She had 6 sutures still in this wound which I removed at the bedside. Will start patient on Bactrim. She is hemodynamically stable, afebrile with no leukocytosis. I do not feel she needs a medical admission for this minimal cellulitis. Her other labs are unremarkable other than alcohol level of 86. Will consult psychiatry for evaluation.  12:21 AM  Pt has been IVC'ed for concerns for SI and intentional overdose.     Layla Maw Ward, DO 05/24/13 0011  Layla Maw Ward, DO 05/24/13 1610

## 2013-05-23 NOTE — ED Notes (Signed)
Writer requested a urine sample, pt sts to writer "NO".  RN notified

## 2013-05-23 NOTE — ED Notes (Addendum)
Report to Latricia RN in secure area-ready for pt-to go to 43

## 2013-05-23 NOTE — ED Notes (Signed)
Pt reports that she overdosed on Xanax, taking 13 one mg tablets. Pt states that she took them because she is depressed. Pt does not report a specific reason for overdose but states "its life". Pt states that she takes psych meds everyday. Pt denies SI or AH/VH. Pt states that she does "want to hurt this person" but pt is unclear on who the person is and states that he is [place]. Pt alert and ambulatory in triage.

## 2013-05-24 DIAGNOSIS — T424X4A Poisoning by benzodiazepines, undetermined, initial encounter: Secondary | ICD-10-CM

## 2013-05-24 DIAGNOSIS — F4323 Adjustment disorder with mixed anxiety and depressed mood: Secondary | ICD-10-CM

## 2013-05-24 DIAGNOSIS — T438X2A Poisoning by other psychotropic drugs, intentional self-harm, initial encounter: Secondary | ICD-10-CM

## 2013-05-24 DIAGNOSIS — T43502A Poisoning by unspecified antipsychotics and neuroleptics, intentional self-harm, initial encounter: Secondary | ICD-10-CM

## 2013-05-24 LAB — RAPID URINE DRUG SCREEN, HOSP PERFORMED
AMPHETAMINES: NOT DETECTED
Barbiturates: NOT DETECTED
Benzodiazepines: POSITIVE — AB
COCAINE: POSITIVE — AB
OPIATES: NOT DETECTED
TETRAHYDROCANNABINOL: NOT DETECTED

## 2013-05-24 MED ORDER — CLINDAMYCIN HCL 300 MG PO CAPS: 300.0000 mg | ORAL_CAPSULE | Freq: Four times a day (QID) | ORAL | Status: DC

## 2013-05-24 NOTE — Consult Note (Signed)
  Review of Systems  HENT: Negative.   Eyes: Negative.   Respiratory: Negative.   Cardiovascular: Negative.   Gastrointestinal: Negative.   Genitourinary: Negative.   Musculoskeletal: Negative.   Skin: Negative.   Neurological: Negative.   Endo/Heme/Allergies: Negative.   Psychiatric/Behavioral: Positive for depression.   Has an excoriation that hurts and is being treated outpatient, she says

## 2013-05-24 NOTE — ED Notes (Signed)
Pt being eval by TTS Dois DavenportSandra.

## 2013-05-24 NOTE — Consult Note (Signed)
[facility] Consult   Reason for Consult:  Took overdose of Xanax Referring Physician:  ER MD  MYKAH BELLOMO is an 48 y.o. female. Total Time spent with patient: 45 minutes  Assessment: AXIS I:  Adjustment Disorder with Mixed Emotional Features AXIS II:  Deferred AXIS III:   Past Medical History  Diagnosis Date  . Bipolar 1 disorder   . Schizophrenia   . Anxiety   . Fibromyalgia   . GERD (gastroesophageal reflux disease)   . Hypertension   . Migraine   . Seizures    AXIS IV:  problems related to social environment AXIS V:  61-70 mild symptoms  Plan:  No evidence of imminent risk to self or others at present.    Subjective:   PADEN KURAS is a 48 y.o. female patient admitted with overdose of Xanax.  HPI:  Ms Weill said she did take too many Xanax because she was upset over her daughter's keeping her grandson away from her.  Since admission she has talked to her daughter and things have been resolved, she says.  The daughter has been influenced by her boyfriend who has been trying to insert a wedge between them.  Now the daughter says she will be leaving him and will even move back in with her mother if she has to.  Consequently, she is not suicidal.  "I have too much to live for" she says.She already sees a Social worker and a psychiatrist and plans to see her psychiatrist tomorrow to talk about a recent medication change that is not helping, she says.  Otherwise she is happy with her life and wants to go home. HPI Elements:   Location:  situation depression. Quality:  took overdose. Severity:  no longer having suicidal thoughts. Timing:  being unable to see her grandson. Duration:  weeks. Context:  as above.  Past Psychiatric History: Past Medical History  Diagnosis Date  . Bipolar 1 disorder   . Schizophrenia   . Anxiety   . Fibromyalgia   . GERD (gastroesophageal reflux disease)   . Hypertension   . Migraine   . Seizures     reports that she has  been smoking Cigarettes.  She has a 30 pack-year smoking history. She has never used smokeless tobacco. She reports that she does not drink alcohol or use illicit drugs. History reviewed. No pertinent family history. Family History Substance Abuse: No Family Supports: Yes, List: (daughter) Living Arrangements: Alone Can pt return to current living arrangement?: Yes Abuse/Neglect [facility]) Physical Abuse: Denies Verbal Abuse: Denies Sexual Abuse: Denies Allergies:   Allergies  Allergen Reactions  . Prednisone Shortness Of Breath and Swelling  . Claritin [Loratadine]     Unknown  . Nsaids Other (See Comments)    GERD  . Sulfa Antibiotics Swelling    ACT Assessment Complete:  Yes:    Educational Status    Risk to Self: Risk to self Suicidal Ideation: Yes-Currently Present Suicidal Intent: Yes-Currently Present Is patient at risk for suicide?: Yes Suicidal Plan?: Yes-Currently Present Specify Current Suicidal Plan:  (pt OD on prescribed xanax) Access to Means: Yes Specify Access to Suicidal Means:  (own medications) What has been your use of drugs/alcohol within the last 12 months?:  (alcohol) Previous Attempts/Gestures: Yes How many times?:  (pt lethargic states "alot") Triggers for Past Attempts: Unpredictable Intentional Self Injurious Behavior: None Family Suicide History: Unknown Recent stressful life event(s): Legal Issues;Recent negative physical changes Persecutory voices/beliefs?: No Depression: Yes Depression Symptoms: Loss  of interest in usual pleasures Substance abuse history and/or treatment for substance abuse?: Yes  Risk to Others: Risk to Others Homicidal Ideation: No-Not Currently/Within Last 6 Months Thoughts of Harm to Others: No Current Homicidal Intent: No Current Homicidal Plan: No Access to Homicidal Means: No History of harm to others?: No Assessment of Violence: None Noted Does patient have access to weapons?: No Criminal Charges Pending?:  Yes Describe Pending Criminal Charges:  (trespassing and resisting arrest) Does patient have a court date: Yes Court Date:  (' "sometime in May")  Abuse: Abuse/Neglect Assessment (Assessment to be complete while patient is alone) Physical Abuse: Denies Verbal Abuse: Denies Sexual Abuse: Denies Exploitation of patient/patient's resources: Denies Self-Neglect: Denies  Prior Inpatient Therapy: Prior Inpatient Therapy Prior Inpatient Therapy: Yes Prior Therapy Dates:  (multiple admits "over 25") Prior Therapy Facilty/Provider(s):  [facility]) Reason for Treatment:  (depression +SI/SA)  Prior Outpatient Therapy: Prior Outpatient Therapy Prior Outpatient Therapy: Yes Prior Therapy Dates:  (ongoing) Prior Therapy Facilty/Provider(s):  ([facility]) Reason for Treatment:  (Bipolar d/o, depression)  Additional Information: Additional Information 1:1 In Past 12 Months?: No CIRT Risk: No Elopement Risk: No Does patient have medical clearance?: Yes                  Objective: Blood pressure 114/62, pulse 83, temperature 98 F (36.7 C), temperature source Oral, resp. rate 20, SpO2 94.00%.There is no weight on file to calculate BMI. Results for orders placed during the hospital encounter of 05/23/13 (from the past 72 hour(s))  CBC     Status: Abnormal   Collection Time    05/23/13  9:44 PM      Result Value Ref Range   WBC 7.4  4.0 - 10.5 K/uL   RBC 4.43  3.87 - 5.11 MIL/uL   Hemoglobin 15.2 (*) 12.0 - 15.0 g/dL   HCT 46.0  36.0 - 46.0 %   MCV 103.8 (*) 78.0 - 100.0 fL   MCH 34.3 (*) 26.0 - 34.0 pg   MCHC 33.0  30.0 - 36.0 g/dL   RDW 14.0  11.5 - 15.5 %   Platelets 237  150 - 400 K/uL  COMPREHENSIVE METABOLIC PANEL     Status: None   Collection Time    05/23/13  9:44 PM      Result Value Ref Range   Sodium 143  137 - 147 mEq/L   Potassium 4.0  3.7 - 5.3 mEq/L   Chloride 104  96 - 112 mEq/L   CO2 22  19 - 32 mEq/L   Glucose, Bld 89  70 - 99 mg/dL   BUN 9  6 -  23 mg/dL   Creatinine, Ser 0.74  0.50 - 1.10 mg/dL   Calcium 9.0  8.4 - 10.5 mg/dL   Total Protein 7.4  6.0 - 8.3 g/dL   Albumin 3.7  3.5 - 5.2 g/dL   AST 23  0 - 37 U/L   ALT 14  0 - 35 U/L   Alkaline Phosphatase 99  39 - 117 U/L   Total Bilirubin 0.3  0.3 - 1.2 mg/dL   GFR calc non Af Amer >90  >90 mL/min   GFR calc Af Amer >90  >90 mL/min   Comment: (NOTE)     The eGFR has been calculated using the CKD EPI equation.     This calculation has not been validated in all clinical situations.     eGFR's persistently <90 mL/min signify possible Chronic Kidney  Disease.  ETHANOL     Status: Abnormal   Collection Time    05/23/13  9:44 PM      Result Value Ref Range   Alcohol, Ethyl (B) 86 (*) 0 - 11 mg/dL   Comment:            LOWEST DETECTABLE LIMIT FOR     SERUM ALCOHOL IS 11 mg/dL     FOR MEDICAL PURPOSES ONLY  ACETAMINOPHEN LEVEL     Status: None   Collection Time    05/23/13  9:44 PM      Result Value Ref Range   Acetaminophen (Tylenol), Serum <15.0  10 - 30 ug/mL   Comment:            THERAPEUTIC CONCENTRATIONS VARY     SIGNIFICANTLY. A RANGE OF 10-30     ug/mL MAY BE AN EFFECTIVE     CONCENTRATION FOR MANY PATIENTS.     HOWEVER, SOME ARE BEST TREATED     AT CONCENTRATIONS OUTSIDE THIS     RANGE.     ACETAMINOPHEN CONCENTRATIONS     >150 ug/mL AT 4 HOURS AFTER     INGESTION AND >50 ug/mL AT 12     HOURS AFTER INGESTION ARE     OFTEN ASSOCIATED WITH TOXIC     REACTIONS.  SALICYLATE LEVEL     Status: None   Collection Time    05/23/13  9:44 PM      Result Value Ref Range   Salicylate Lvl 3.2  2.8 - 20.0 mg/dL  CBG MONITORING, ED     Status: Abnormal   Collection Time    05/23/13 11:11 PM      Result Value Ref Range   Glucose-Capillary 116 (*) 70 - 99 mg/dL   Comment 1 Notify RN     Comment 2 Documented in Chart    URINE RAPID DRUG SCREEN (HOSP PERFORMED)     Status: Abnormal   Collection Time    05/24/13  8:25 AM      Result Value Ref Range   Opiates NONE  DETECTED  NONE DETECTED   Cocaine POSITIVE (*) NONE DETECTED   Benzodiazepines POSITIVE (*) NONE DETECTED   Amphetamines NONE DETECTED  NONE DETECTED   Tetrahydrocannabinol NONE DETECTED  NONE DETECTED   Barbiturates NONE DETECTED  NONE DETECTED   Comment:            DRUG SCREEN FOR MEDICAL PURPOSES     ONLY.  IF CONFIRMATION IS NEEDED     FOR ANY PURPOSE, NOTIFY LAB     WITHIN 5 DAYS.                LOWEST DETECTABLE LIMITS     FOR URINE DRUG SCREEN     Drug Class       Cutoff (ng/mL)     Amphetamine      1000     Barbiturate      200     Benzodiazepine   295     Tricyclics       188     Opiates          300     Cocaine          300     THC              50   Labs are reviewed and are pertinent for cocaine and benzodiazepines.  Current Facility-Administered Medications  Medication Dose Route Frequency Provider Last  Rate Last Dose  . acetaminophen (TYLENOL) tablet 650 mg  650 mg Oral Q4H PRN Kristen N Ward, DO   650 mg at 05/24/13 0932  . alum & mag hydroxide-simeth (MAALOX/MYLANTA) 200-200-20 MG/5ML suspension 30 mL  30 mL Oral PRN Kristen N Ward, DO      . clindamycin (CLEOCIN) capsule 300 mg  300 mg Oral 4 times per day Delice Bison Ward, DO   300 mg at 05/24/13 1208  . hydrochlorothiazide (HYDRODIURIL) tablet 25 mg  25 mg Oral Daily Kristen N Ward, DO   25 mg at 05/24/13 0933  . nicotine (NICODERM CQ - dosed in mg/24 hours) patch 21 mg  21 mg Transdermal Daily Kristen N Ward, DO   21 mg at 05/24/13 0932  . ondansetron (ZOFRAN) tablet 4 mg  4 mg Oral Q8H PRN Kristen N Ward, DO      . PARoxetine (PAXIL) tablet 20 mg  20 mg Oral Daily Kristen N Ward, DO   20 mg at 05/24/13 8016   Current Outpatient Prescriptions  Medication Sig Dispense Refill  . acetaminophen (TYLENOL) 325 MG tablet Take 1,300 mg by mouth every 6 (six) hours as needed for moderate pain.       Marland Kitchen ALPRAZolam (XANAX) 1 MG tablet Take 1 mg by mouth 3 (three) times daily.      . Aspirin-Acetaminophen (GOODYS BODY PAIN  PO) Take 4 Packages by mouth every 6 (six) hours as needed (pain).       Marland Kitchen aspirin-acetaminophen-caffeine (EXCEDRIN MIGRAINE) 250-250-65 MG per tablet Take 2 tablets by mouth every 8 (eight) hours as needed for migraine.       . hydrochlorothiazide (HYDRODIURIL) 25 MG tablet Take 1 tablet (25 mg total) by mouth daily. For HTN  30 tablet  0  . PARoxetine (PAXIL) 20 MG tablet Take 20 mg by mouth daily.        Psychiatric Specialty Exam:     Blood pressure 114/62, pulse 83, temperature 98 F (36.7 C), temperature source Oral, resp. rate 20, SpO2 94.00%.There is no weight on file to calculate BMI.  General Appearance: Well Groomed  Engineer, water::  Good  Speech:  Clear and Coherent  Volume:  Normal  Mood:  Anxious  Affect:  Appropriate  Thought Process:  Coherent  Orientation:  Full (Time, Place, and Person)  Thought Content:  Negative  Suicidal Thoughts:  No  Homicidal Thoughts:  No  Memory:  Immediate;   Good Recent;   Good Remote;   Good  Judgement:  Good  Insight:  Good  Psychomotor Activity:  normal  Concentration:  Good  Recall:  Good  Fund of Knowledge:Good  Language: Good  Akathisia:  Negative  Handed:  Right  AIMS (if indicated):     Assets:  Communication Skills Desire for Improvement Financial Resources/Insurance Housing Intimacy Leisure Time Resilience Social Support Talents/Skills Transportation  Sleep:      Musculoskeletal: Strength & Muscle Tone: within normal limits Gait & Station: normal Patient leans: N/A  Treatment Plan Summary: no longer suicidal, so she will be discharged home to follow with her current psychiatrist and therapist  Clarene Reamer 05/24/2013 1:14 PM

## 2013-05-24 NOTE — ED Notes (Signed)
Received pt from triage, presents for medical clearance.  Pt reports she ingested 9-13/1 mg Xanax tonight, because she was depressed.  Denies SI, HI or AV hallucinations.  Reports feeling hopeless.  Pt follows up with Dr Redmond BasemanHayden outpatient.  Wound noted to rt anterior lower leg.  Rates pain as 9 on pain scale.  No drainage noted, wound red and painful to touch.  Pt admits to attempting SI 15 yrs ago, but doesn't remember how she hurt herself., Pt reports she drank beer and 1 mixed drink today.  Pt cooperative but anxious.

## 2013-05-24 NOTE — [facility] Suicide Risk Assessment (Signed)
Suicide Risk Assessment  Discharge Assessment     Demographic Factors:  Caucasian  Total Time spent with patient: 45 minutes  Psychiatric Specialty Exam:     Blood pressure 114/62, pulse 83, temperature 98 F (36.7 C), temperature source Oral, resp. rate 20, SpO2 94.00%.There is no weight on file to calculate BMI.  General Appearance: Casual  Eye Contact::  Good  Speech:  Clear and Coherent  Volume:  Normal  Mood:  Anxious  Affect:  Appropriate  Thought Process:  Coherent  Orientation:  Full (Time, Place, and Person)  Thought Content:  Negative  Suicidal Thoughts:  No  Homicidal Thoughts:  No  Memory:  Immediate;   Good Recent;   Good Remote;   Good  Judgement:  Good  Insight:  Good  Psychomotor Activity:  Normal  Concentration:  Good  Recall:  Good  Fund of Knowledge:Good  Language: Good  Akathisia:  Negative  Handed:  Right  AIMS (if indicated):     Assets:  Communication Skills Desire for Improvement Financial Resources/Insurance Housing Leisure Time Resilience Social Support Talents/Skills Transportation Vocational/Educational  Sleep:       Musculoskeletal: Strength & Muscle Tone: within normal limits Gait & Station: normal Patient leans: N/A   Mental Status Per Nursing Assessment::   On Admission:     Current Mental Status by Physician: NA  Loss Factors: NA  Historical Factors: NA  Risk Reduction Factors:   Sense of responsibility to family, Living with another person, especially a relative, Positive social support, Positive therapeutic relationship and Positive coping skills or problem solving skills  Continued Clinical Symptoms:  Mildly depressed  Cognitive Features That Contribute To Risk:  none    Suicide Risk:  Minimal: No identifiable suicidal ideation.  Patients presenting with no risk factors but with morbid ruminations; may be classified as minimal risk based on the severity of the depressive symptoms  Discharge  Diagnoses:   AXIS I:  Adjustment Disorder with Depressed Mood AXIS II:  Deferred AXIS III:   Past Medical History  Diagnosis Date  . Bipolar 1 disorder   . Schizophrenia   . Anxiety   . Fibromyalgia   . GERD (gastroesophageal reflux disease)   . Hypertension   . Migraine   . Seizures    AXIS IV:  problems related to social environment AXIS V:  61-70 mild symptoms  Plan Of Care/Follow-up recommendations:  Activity:  resume activity Diet:  resume diet  Is patient on multiple antipsychotic therapies at discharge:  No   Has Patient had three or more failed trials of antipsychotic monotherapy by history:  No  Recommended Plan for Multiple Antipsychotic Therapies: NA    Benjaman PottGerald D Taylor 05/24/2013, 1:34 PM

## 2013-05-24 NOTE — [facility] Assessment (Signed)
Assessment Note  [name] is an 48 y.o. female. with history of Bipolar D/O and Schizophrenia and anxiety. She is  lethargic during assessment with Clinical research associate and had to be awaken and questions repeated. Pt admits to calling therapist tonight and threatening to OD. Therapist with [facility] then called her psychiatrist Dr. Omelia Blackwater who referred her to hospital for evaluation and admission. Pt states she took 12 xanax.  She also drank 1 beer. She reported to ED that she has been depressed because she has not been able to see her grandson. Denies HI. Pt admits to legal charges for trespassing and resisting arrest with a court date in May. Pt difficult to assess due to being lethargic but denies A/V hall at present. She is also complaining of pain in her right lower extremity due to an injury 3 weeks ago. The wound has not healed with surrounding erythema.    Axis I: Major Depression  Axis II: Deferred Axis III:  Past Medical History  Diagnosis Date  . Bipolar 1 disorder   . Schizophrenia   . Anxiety   . Fibromyalgia   . GERD (gastroesophageal reflux disease)   . Hypertension   . Migraine   . Seizures    Axis IV: problems related to social environment and problems with primary support group Axis V: 31-40 impairment in reality testing  Past Medical History:  Past Medical History  Diagnosis Date  . Bipolar 1 disorder   . Schizophrenia   . Anxiety   . Fibromyalgia   . GERD (gastroesophageal reflux disease)   . Hypertension   . Migraine   . Seizures     Past Surgical History  Procedure Laterality Date  . Cesarean section    . Mouth surgery  2014    Family History: History reviewed. No pertinent family history.  Social History:  reports that she has been smoking Cigarettes.  She has a 30 pack-year smoking history. She has never used smokeless tobacco. She reports that she does not drink alcohol or use illicit drugs.  Additional Social History:  Alcohol / Drug  Use Pain Medications: see PTA Prescriptions: see PTA Over the Counter: see PTA History of alcohol / drug use?: Yes Substance #1 Name of Substance 1: Alcohol beer 1 - Age of First Use: "dont remember" 1 - Amount (size/oz): 1 beer 1 - Frequency: rarely 1 - Duration: "not long" 1 - Last Use / Amount: 05/23/13 1 beer  CIWA: CIWA-Ar BP: 116/87 mmHg Pulse Rate: 77 Nausea and Vomiting: no nausea and no vomiting Tactile Disturbances: none Tremor: no tremor Auditory Disturbances: not present Paroxysmal Sweats: no sweat visible Visual Disturbances: not present Anxiety: no anxiety, at ease Headache, Fullness in Head: none present Agitation: normal activity Orientation and Clouding of Sensorium: oriented and can do serial additions CIWA-Ar Total: 0 COWS:    Allergies:  Allergies  Allergen Reactions  . Prednisone Shortness Of Breath and Swelling  . Claritin [Loratadine]     Unknown  . Nsaids Other (See Comments)    GERD  . Sulfa Antibiotics Swelling    Home Medications:  (Not in a hospital admission)  OB/GYN Status:  No LMP recorded. Patient is postmenopausal.  General Assessment Data Location of Assessment: [facility] ED Is this a Tele or Face-to-Face Assessment?: Face-to-Face Is this an Initial Assessment or a Re-assessment for this encounter?: Initial Assessment Living Arrangements: Alone Can pt return to current living arrangement?: Yes Admission Status: Involuntary Is patient capable of signing voluntary admission?: No  Transfer from: Home Referral Source: Psychiatrist  Medical Screening Exam [facility] Walk-in ONLY) Medical Exam completed: Yes  [facility] Living Arrangements: Alone Name of Psychiatrist:  (Dr Omelia BlackwaterHeaden) Name of Therapist:  ([facility] counselor)  Education Status Is patient currently in school?: No Current Grade: NA Highest grade of school patient has completed: NA Name of school: NA Contact person: NA  Risk to self Suicidal Ideation:  Yes-Currently Present Suicidal Intent: Yes-Currently Present Is patient at risk for suicide?: Yes Suicidal Plan?: Yes-Currently Present Specify Current Suicidal Plan:  (pt OD on prescribed xanax) Access to Means: Yes Specify Access to Suicidal Means:  (own medications) What has been your use of drugs/alcohol within the last 12 months?:  (alcohol) Previous Attempts/Gestures: Yes How many times?:  (pt lethargic states "alot") Triggers for Past Attempts: Unpredictable Intentional Self Injurious Behavior: None Family Suicide History: Unknown Recent stressful life event(s): Legal Issues;Recent negative physical changes Persecutory voices/beliefs?: No Depression: Yes Depression Symptoms: Loss of interest in usual pleasures Substance abuse history and/or treatment for substance abuse?: Yes  Risk to Others Homicidal Ideation: No-Not Currently/Within Last 6 Months Thoughts of Harm to Others: No Current Homicidal Intent: No Current Homicidal Plan: No Access to Homicidal Means: No History of harm to others?: No Assessment of Violence: None Noted Does patient have access to weapons?: No Criminal Charges Pending?: Yes Describe Pending Criminal Charges:  (trespassing and resisting arrest) Does patient have a court date: Yes Court Date:  (' "sometime in May")  Psychosis Hallucinations: None noted Delusions: None noted  Mental Status Report Appear/Hygiene: Disheveled Eye Contact: Poor Motor Activity: Other (Comment) (pt lethargic and questions repeated) Speech: Slurred Level of Consciousness: Drowsy Affect: Other (Comment) (Pt lethargic and difficult to assess) Anxiety Level: None Thought Processes: Relevant Judgement: Impaired Orientation: Person;Place;Time;Situation Obsessive Compulsive Thoughts/Behaviors: None  Cognitive Functioning Concentration: Decreased Memory:  (pt lethargic and difficult to assess) IQ: Average Insight: Poor Impulse Control: Poor Appetite: Poor Weight  Loss:  (denies) Weight Gain:  (denies) Sleep: No Change Total Hours of Sleep:  (6)  ADLScreening [facility] Assessment Services) Patient's cognitive ability adequate to safely complete daily activities?: Yes Patient able to express need for assistance with ADLs?: Yes Independently performs ADLs?: Yes (appropriate for developmental age)  Prior Inpatient Therapy Prior Inpatient Therapy: Yes Prior Therapy Dates:  (multiple admits "over 25") Prior Therapy Facilty/Provider(s):  [facility]) Reason for Treatment:  (depression +SI/SA)  Prior Outpatient Therapy Prior Outpatient Therapy: Yes Prior Therapy Dates:  (ongoing) Prior Therapy Facilty/Provider(s):  ([facility]) Reason for Treatment:  (Bipolar d/o, depression)  ADL Screening (condition at time of admission) Patient's cognitive ability adequate to safely complete daily activities?: Yes Is the patient deaf or have difficulty hearing?: No Does the patient have difficulty seeing, even when wearing glasses/contacts?: No Does the patient have difficulty concentrating, remembering, or making decisions?: Yes Patient able to express need for assistance with ADLs?: Yes Does the patient have difficulty dressing or bathing?: No Independently performs ADLs?: Yes (appropriate for developmental age) Does the patient have difficulty walking or climbing stairs?: No Weakness of Legs: None Weakness of Arms/Hands: None  Home Assistive Devices/Equipment Home Assistive Devices/Equipment: None    Abuse/Neglect Assessment (Assessment to be complete while patient is alone) Physical Abuse: Denies Verbal Abuse: Denies Sexual Abuse: Denies Exploitation of patient/patient's resources: Denies Self-Neglect: Denies Values / Beliefs Cultural Requests During Hospitalization: None Spiritual Requests During Hospitalization: None Consults Spiritual Care Consult Needed: No Social Work Consult Needed: No Merchant navy officerAdvance Directives (For Healthcare) Advance  Directive: Patient does not have advance directive;Patient would not like information Pre-existing out of facility DNR order (yellow form or pink MOST form): No    Additional Information 1:1 In Past 12 Months?: No CIRT Risk: No Elopement Risk: No Does patient have medical clearance?: Yes     Disposition:     On Site Evaluation by:   Reviewed with Physician:    Celene Kras 05/24/2013 5:03 AM

## 2013-06-11 ENCOUNTER — Ambulatory Visit: Payer: Self-pay | Admitting: Neurology

## 2013-06-11 ENCOUNTER — Encounter: Payer: Self-pay | Admitting: Neurology

## 2013-09-01 ENCOUNTER — Emergency Department (HOSPITAL_COMMUNITY)
Admission: EM | Admit: 2013-09-01 | Discharge: 2013-09-01 | Disposition: A | Discharge status: Home / Self Care | Payer: Medicare Other | Attending: Emergency Medicine | Admitting: Emergency Medicine

## 2013-09-01 ENCOUNTER — Encounter (HOSPITAL_COMMUNITY): Payer: Self-pay | Admitting: Emergency Medicine

## 2013-09-01 DIAGNOSIS — IMO0001 Reserved for inherently not codable concepts without codable children: Secondary | ICD-10-CM | POA: Insufficient documentation

## 2013-09-01 DIAGNOSIS — Z791 Long term (current) use of non-steroidal anti-inflammatories (NSAID): Secondary | ICD-10-CM | POA: Insufficient documentation

## 2013-09-01 DIAGNOSIS — Z8669 Personal history of other diseases of the nervous system and sense organs: Secondary | ICD-10-CM | POA: Insufficient documentation

## 2013-09-01 DIAGNOSIS — Z79899 Other long term (current) drug therapy: Secondary | ICD-10-CM | POA: Diagnosis not present

## 2013-09-01 DIAGNOSIS — I10 Essential (primary) hypertension: Secondary | ICD-10-CM | POA: Diagnosis not present

## 2013-09-01 DIAGNOSIS — M5430 Sciatica, unspecified side: Secondary | ICD-10-CM | POA: Diagnosis not present

## 2013-09-01 DIAGNOSIS — Z7982 Long term (current) use of aspirin: Secondary | ICD-10-CM | POA: Insufficient documentation

## 2013-09-01 DIAGNOSIS — F17200 Nicotine dependence, unspecified, uncomplicated: Secondary | ICD-10-CM | POA: Diagnosis not present

## 2013-09-01 DIAGNOSIS — F319 Bipolar disorder, unspecified: Secondary | ICD-10-CM | POA: Insufficient documentation

## 2013-09-01 DIAGNOSIS — K219 Gastro-esophageal reflux disease without esophagitis: Secondary | ICD-10-CM | POA: Diagnosis not present

## 2013-09-01 DIAGNOSIS — N390 Urinary tract infection, site not specified: Secondary | ICD-10-CM | POA: Diagnosis not present

## 2013-09-01 DIAGNOSIS — M545 Low back pain, unspecified: Secondary | ICD-10-CM | POA: Diagnosis present

## 2013-09-01 DIAGNOSIS — F411 Generalized anxiety disorder: Secondary | ICD-10-CM | POA: Diagnosis not present

## 2013-09-01 DIAGNOSIS — M5442 Lumbago with sciatica, left side: Secondary | ICD-10-CM

## 2013-09-01 LAB — URINALYSIS, ROUTINE W REFLEX MICROSCOPIC
BILIRUBIN URINE: NEGATIVE
Glucose, UA: NEGATIVE mg/dL
Hgb urine dipstick: NEGATIVE
Ketones, ur: NEGATIVE mg/dL
Nitrite: POSITIVE — AB
PROTEIN: NEGATIVE mg/dL
SPECIFIC GRAVITY, URINE: 1.011 (ref 1.005–1.030)
UROBILINOGEN UA: 0.2 mg/dL (ref 0.0–1.0)
pH: 5 (ref 5.0–8.0)

## 2013-09-01 LAB — URINE MICROSCOPIC-ADD ON

## 2013-09-01 MED ORDER — METHOCARBAMOL 500 MG PO TABS: 500.0000 mg | ORAL_TABLET | Freq: Three times a day (TID) | ORAL | Status: DC | PRN

## 2013-09-01 MED ORDER — CEPHALEXIN 500 MG PO CAPS: ORAL_CAPSULE | ORAL | Status: DC

## 2013-09-01 MED ORDER — RANITIDINE HCL 150 MG PO TABS: 150.0000 mg | ORAL_TABLET | Freq: Two times a day (BID) | ORAL | Status: DC

## 2013-09-01 MED ORDER — DIAZEPAM 2 MG PO TABS
2.0000 mg | ORAL_TABLET | Freq: Once | ORAL | Status: AC
  Administered 2013-09-01: 2 mg via ORAL
  Filled 2013-09-01: qty 1

## 2013-09-01 MED ORDER — MELOXICAM 7.5 MG PO TABS: 7.5000 mg | ORAL_TABLET | Freq: Every day | ORAL | Status: DC

## 2013-09-01 MED ORDER — HYDROMORPHONE HCL PF 1 MG/ML IJ SOLN
1.0000 mg | Freq: Once | INTRAMUSCULAR | Status: AC
  Administered 2013-09-01: 1 mg via INTRAMUSCULAR
  Filled 2013-09-01: qty 1

## 2013-09-01 MED ORDER — OXYCODONE-ACETAMINOPHEN 5-325 MG PO TABS: 1.0000 | ORAL_TABLET | Freq: Four times a day (QID) | ORAL | Status: DC | PRN

## 2013-09-01 NOTE — ED Notes (Signed)
PA at bedside.

## 2013-09-01 NOTE — Discharge Instructions (Signed)
You have an aggravation of your chronic back pain, for while I've prescribed Mobic with Zantac, Percocet, and Robaxin. You also have a Urinary Tract infection, for which you need to take KEFLEX twice daily as directed, for the next 7 days. Stay well hydrated. See your regular doctor in one week for a check up of your urinary tract infection, and see the orthopedic doctor listed above for your back pain. Return to the emergency department for any changes or worsening of symptoms.   Back Pain: Your back pain should be treated with medicines such as ibuprofen or aleve and this back pain should get better over the next 2 weeks.  However if you develop severe or worsening pain, low back pain with fever, numbness, weakness or inability to walk or urinate, you should return to the ER immediately.  Please follow up with your doctor this week for a recheck if still having symptoms.  Low back pain is discomfort in the lower back that may be due to injuries to muscles and ligaments around the spine.  Occasionally, it may be caused by a a problem to a part of the spine called a disc.  The pain may last several days or a week;  However, most patients get completely well in 4 weeks.  Self - care:  The application of heat can help soothe the pain.  Maintaining your daily activities, including walking, is encourged, as it will help you get better faster than just staying in bed. Perform gentle stretching as discussed. Drink plenty of fluids.  Medications are also useful to help with pain control.  A commonly prescribed medications includes Percocet.  Non steroidal anti inflammatory medications including Ibuprofen and naproxen or Mobic;  These medications help both pain and swelling and are very useful in treating back pain.  They should be taken with food, as they can cause stomach upset, and more seriously, stomach bleeding.  USE ZANTAC WHILE TAKING THIS MEDICATION  Muscle relaxants (Robaxin):  These medications can  help with muscle tightness that is a cause of lower back pain.  Most of these medications can cause drowsiness, and it is not safe to drive or use dangerous machinery while taking them.  SEEK IMMEDIATE MEDICAL ATTENTION IF: New numbness, tingling, weakness, or problem with the use of your arms or legs.  Severe back pain not relieved with medications.  Difficulty with or loss of control of your bowel or bladder control.  Increasing pain in any areas of the body (such as chest or abdominal pain).  Shortness of breath, dizziness or fainting.  Nausea (feeling sick to your stomach), vomiting, fever, or sweats.  You will need to follow up with the orthopedic doctor for further evaluation.   Lumbosacral Strain Lumbosacral strain is a strain of any of the parts that make up your lumbosacral vertebrae. Your lumbosacral vertebrae are the bones that make up the lower third of your backbone. Your lumbosacral vertebrae are held together by muscles and tough, fibrous tissue (ligaments).  CAUSES  A sudden blow to your back can cause lumbosacral strain. Also, anything that causes an excessive stretch of the muscles in the low back can cause this strain. This is typically seen when people exert themselves strenuously, fall, lift heavy objects, bend, or crouch repeatedly. RISK FACTORS  Physically demanding work.  Participation in pushing or pulling sports or sports that require a sudden twist of the back (tennis, golf, baseball).  Weight lifting.  Excessive lower back curvature.  Forward-tilted pelvis.  Weak back or abdominal muscles or both.  Tight hamstrings. SIGNS AND SYMPTOMS  Lumbosacral strain may cause pain in the area of your injury or pain that moves (radiates) down your leg.  DIAGNOSIS Your health care provider can often diagnose lumbosacral strain through a physical exam. In some cases, you may need tests such as X-ray exams.  TREATMENT  Treatment for your lower back injury depends on  many factors that your clinician will have to evaluate. However, most treatment will include the use of anti-inflammatory medicines. HOME CARE INSTRUCTIONS   Avoid hard physical activities (tennis, racquetball, waterskiing) if you are not in proper physical condition for it. This may aggravate or create problems.  If you have a back problem, avoid sports requiring sudden body movements. Swimming and walking are generally safer activities.  Maintain good posture.  Maintain a healthy weight.  For acute conditions, you may put ice on the injured area.  Put ice in a plastic bag.  Place a towel between your skin and the bag.  Leave the ice on for 20 minutes, 2-3 times a day.  When the low back starts healing, stretching and strengthening exercises may be recommended. SEEK MEDICAL CARE IF:  Your back pain is getting worse.  You experience severe back pain not relieved with medicines. SEEK IMMEDIATE MEDICAL CARE IF:   You have numbness, tingling, weakness, or problems with the use of your arms or legs.  There is a change in bowel or bladder control.  You have increasing pain in any area of the body, including your belly (abdomen).  You notice shortness of breath, dizziness, or feel faint.  You feel sick to your stomach (nauseous), are throwing up (vomiting), or become sweaty.  You notice discoloration of your toes or legs, or your feet get very cold. MAKE SURE YOU:   Understand these instructions.  Will watch your condition.  Will get help right away if you are not doing well or get worse. Document Released: 11/11/2004 Document Revised: 02/06/2013 Document Reviewed: 09/20/2012 [facility] Patient Information 2015 [place], [place]. This information is not intended to replace advice given to you by your health care provider. Make sure you discuss any questions you have with your health care provider.  Urinary Tract Infection Urinary tract infections (UTIs) can develop anywhere  along your urinary tract. Your urinary tract is your body's drainage system for removing wastes and extra water. Your urinary tract includes two kidneys, two ureters, a bladder, and a urethra. Your kidneys are a pair of bean-shaped organs. Each kidney is about the size of your fist. They are located below your ribs, one on each side of your spine. CAUSES Infections are caused by microbes, which are microscopic organisms, including fungi, viruses, and bacteria. These organisms are so small that they can only be seen through a microscope. Bacteria are the microbes that most commonly cause UTIs. SYMPTOMS  Symptoms of UTIs may vary by age and gender of the patient and by the location of the infection. Symptoms in young women typically include a frequent and intense urge to urinate and a painful, burning feeling in the bladder or urethra during urination. Older women and men are more likely to be tired, shaky, and weak and have muscle aches and abdominal pain. A fever may mean the infection is in your kidneys. Other symptoms of a kidney infection include pain in your back or sides below the ribs, nausea, and vomiting. DIAGNOSIS To diagnose a UTI, your caregiver will ask you about your  symptoms. Your caregiver also will ask to provide a urine sample. The urine sample will be tested for bacteria and white blood cells. White blood cells are made by your body to help fight infection. TREATMENT  Typically, UTIs can be treated with medication. Because most UTIs are caused by a bacterial infection, they usually can be treated with the use of antibiotics. The choice of antibiotic and length of treatment depend on your symptoms and the type of bacteria causing your infection. HOME CARE INSTRUCTIONS  If you were prescribed antibiotics, take them exactly as your caregiver instructs you. Finish the medication even if you feel better after you have only taken some of the medication.  Drink enough water and fluids to keep  your urine clear or pale yellow.  Avoid caffeine, tea, and carbonated beverages. They tend to irritate your bladder.  Empty your bladder often. Avoid holding urine for long periods of time.  Empty your bladder before and after sexual intercourse.  After a bowel movement, women should cleanse from front to back. Use each tissue only once. SEEK MEDICAL CARE IF:   You have back pain.  You develop a fever.  Your symptoms do not begin to resolve within 3 days. SEEK IMMEDIATE MEDICAL CARE IF:   You have severe back pain or lower abdominal pain.  You develop chills.  You have nausea or vomiting.  You have continued burning or discomfort with urination. MAKE SURE YOU:   Understand these instructions.  Will watch your condition.  Will get help right away if you are not doing well or get worse. Document Released: 11/11/2004 Document Revised: 08/03/2011 Document Reviewed: 03/12/2011 [facility] Patient Information 2015 [place], [place]. This information is not intended to replace advice given to you by your health care provider. Make sure you discuss any questions you have with your health care provider.

## 2013-09-01 NOTE — ED Notes (Addendum)
Per EMS_ pt has hx of chronic back pain but has become worse over the last 2 days. Denies any known injury states "just wear and tear from my jobs". Pt states that pain is in lower back with no relief with OTC medications or position changes. Pt states that pain will radiate to legs at time but denies numbness or tingling in legs. Pt states that she is able to walk but she cannot stand up straight.

## 2013-09-01 NOTE — ED Provider Notes (Signed)
CSN: [id number]     Arrival date & time 09/01/13  0844 History   First MD Initiated Contact with Patient 09/01/13 [id number]     Chief Complaint  Patient presents with  . Back Pain     (Consider location/radiation/quality/duration/timing/severity/associated sxs/prior Treatment) HPI Comments: [name] is a 48 y.o. 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 Pt states the pain is 10/10, aching, located in the lumbar spine, non-radiating, worse with ambulation sitting and standing, and better with heat. Pt states she tried goody's powders and advil with no relief. Pt states this pain is the same as her usual back pain, but she had not had issues with it in many years. Was previously seeing an orthopedic doctor and a pain clinic. Denies weakness, loss of bowel/bladder function or saddle anesthesia. Denies weight loss. Denies fevers, chills, HA, CP, SOB, abd pain, n/v/d/c, dysuria, hematuria, vaginal bleeding/discharge, paresthesias, leg pain, or myalgias. Smokes 1ppd, denies drinking or illicit drug use. States she is on disability and does not work at the moment.   Patient is a 48 y.o. female presenting with back pain. The history is provided by the patient. No language interpreter was used.  Back Pain Location:  Lumbar spine Quality:  Aching Radiates to:  Does not radiate Pain severity:  Moderate Pain is:  Same all the time Onset quality:  Sudden Duration:  1 day Timing:  Constant Progression:  Unchanged Chronicity:  Chronic Context: twisting   Relieved by:  Heating pad Worsened by:  Bending, ambulation, lying down, sitting, palpation, movement and twisting Ineffective treatments:  Ibuprofen and OTC medications Associated symptoms: no abdominal pain, no abdominal swelling, no bladder incontinence, no bowel  incontinence, no chest pain, no dysuria, no fever, no headaches, no leg pain, no numbness, no paresthesias, no pelvic pain, no perianal numbness, no tingling, no weakness and no weight loss   Risk factors: lack of exercise     Past Medical History  Diagnosis Date  . Bipolar 1 disorder   . Schizophrenia   . Anxiety   . Fibromyalgia   . GERD (gastroesophageal reflux disease)   . Hypertension   . Migraine   . Seizures    Past Surgical History  Procedure Laterality Date  . Cesarean section    . Mouth surgery  2014   No family history on file. History  Substance Use Topics  . Smoking status: Current Every Day Smoker -- 1.00 packs/day for 15 years    Types: Cigarettes  . Smokeless tobacco: Never Used  . Alcohol Use: No   OB History   Grav Para Term Preterm Abortions TAB SAB Ect Mult Living                 Review of Systems  Constitutional: Negative for fever, chills and weight loss.  Eyes: Negative for visual disturbance.  Respiratory: Negative for shortness of breath.   Cardiovascular: Negative for chest pain and leg swelling.  Gastrointestinal: Negative for nausea, vomiting, abdominal pain, diarrhea, constipation and bowel incontinence.  Genitourinary: Negative for bladder incontinence, dysuria, urgency, frequency, hematuria, flank pain, decreased urine volume, vaginal bleeding, vaginal discharge, difficulty urinating, vaginal pain, menstrual problem and pelvic pain.  Musculoskeletal: Positive for back pain. Negative for arthralgias, gait problem, joint swelling, myalgias, neck pain and neck stiffness.  Skin: Negative for color change.  Neurological: Negative for  dizziness, tingling, syncope, weakness, numbness, headaches and paresthesias.  Psychiatric/Behavioral: Negative for confusion.  10 Systems reviewed and are negative for acute change except as noted in the HPI.     Allergies  Prednisone; Claritin; Nsaids; and Sulfa antibiotics  Home Medications   Prior to  Admission medications   Medication Sig Start Date End Date Taking? Authorizing Provider  Aspirin-Acetaminophen (GOODYS BODY PAIN PO) Take 4 Packages by mouth every 6 (six) hours as needed (pain).    Yes Historical Provider, MD  aspirin-acetaminophen-caffeine (EXCEDRIN MIGRAINE) [phone] MG per tablet Take 2 tablets by mouth every 8 (eight) hours as needed for migraine.    Yes Historical Provider, MD  ibuprofen (ADVIL,MOTRIN) 200 MG tablet Take 200-400 mg by mouth every 6 (six) hours as needed for fever, headache, mild pain, moderate pain or cramping.   Yes Historical Provider, MD  LORazepam (ATIVAN) 1 MG tablet Take 1 mg by mouth every 8 (eight) hours as needed for anxiety.   Yes Historical Provider, MD  PARoxetine (PAXIL) 40 MG tablet Take 40 mg by mouth daily.   Yes Historical Provider, MD  QUEtiapine (SEROQUEL) 300 MG tablet Take 300 mg by mouth at bedtime.   Yes Historical Provider, MD  traZODone (DESYREL) 100 MG tablet Take 100 mg by mouth at bedtime as needed for sleep.   Yes Historical Provider, MD  verapamil (CALAN) 40 MG tablet Take 20 mg by mouth daily as needed (high blood pressure).   Yes Historical Provider, MD  cephALEXin (KEFLEX) 500 MG capsule 2 caps po bid x 7 days 09/01/13   Donnita Falls Camprubi-Soms, PA-C  meloxicam (MOBIC) 7.5 MG tablet Take 1 tablet (7.5 mg total) by mouth daily. May take one additional tablet 12 hours after the first dose if pain persists. TAKE WITH FOOD, AND ZANTAC 09/01/13   Fue Cervenka Strupp Camprubi-Soms, PA-C  methocarbamol (ROBAXIN) 500 MG tablet Take 1 tablet (500 mg total) by mouth every 8 (eight) hours as needed for muscle spasms. 09/01/13   Dima Ferrufino Strupp Camprubi-Soms, PA-C  oxyCODONE-acetaminophen (PERCOCET) 5-325 MG per tablet Take 1-2 tablets by mouth every 6 (six) hours as needed for severe pain. 09/01/13   Josey Forcier Strupp Camprubi-Soms, PA-C  ranitidine (ZANTAC) 150 MG tablet Take 1 tablet (150 mg total) by mouth 2 (two) times daily. While taking  Mobic or other NSAIDs 09/01/13   Trinidy Masterson Strupp Camprubi-Soms, PA-C   BP 136/81  Temp(Src) 98.7 F (37.1 C) (Oral)  Resp 12  SpO2 100% Physical Exam  Nursing note and vitals reviewed. Constitutional: Vital signs are normal. She appears well-developed and well-nourished. No distress.  Awake, alert, nontoxic appearance with baseline speech.  HENT:  Head: Normocephalic and atraumatic.  Mouth/Throat: Mucous membranes are normal.  Eyes: Conjunctivae and EOM are normal. Pupils are equal, round, and reactive to light. Right eye exhibits no discharge. Left eye exhibits no discharge.  Neck: Normal range of motion. Neck supple. No spinous process tenderness and no muscular tenderness present. No rigidity. Normal range of motion present.  FROM intact, no midline spinous process TTP or deformity, no bony step offs. No paraspinous muscle TTP  Cardiovascular: Normal rate, regular rhythm, normal heart sounds and intact distal pulses.   No murmur heard. Pulmonary/Chest: Effort normal and breath sounds normal. No respiratory distress. She has no decreased breath sounds. She has no wheezes. She has no rales. She exhibits no tenderness.  Abdominal: Soft. Normal appearance and bowel sounds are normal. She exhibits no distension. There is no tenderness. There is no rigidity, no  rebound, no guarding and no CVA tenderness.  Musculoskeletal: Normal range of motion.       Right hip: Normal.       Left hip: Normal.       Cervical back: Normal.       Thoracic back: Normal. She exhibits no tenderness.       Lumbar back: She exhibits tenderness, pain and spasm. She exhibits normal range of motion and no bony tenderness.       Back:  Cspine as above Tspine ROM intact with no midline bony TTP or crepitus, no step offs or deformity, no paraspinous muscle TTP. Lumbar spine with paraspinous muscle TTP bilaterally, no midline TTP or bony step offs/deformity. Mild spasm noted on L paraspinous muscles. +SLR on left. Hips  nonTTP and FROM intact.  Bilateral lower extremities non tender without new rashes or color change, baseline ROM with intact DP / PT pulses, CR<2 secs all digits bilaterally, sensation baseline light touch bilaterally for pt, DTR's symmetric and intact bilaterally, motor symmetric bilateral 5 / 5 hip flexion, quadriceps, hamstrings, EHL, foot dorsiflexion, foot plantarflexion, gait somewhat antalgic but without apparent new ataxia.  Neurological: She is alert. She has normal strength and normal reflexes. No sensory deficit. Gait normal.  Sensation grossly intact in all extremities. Strength 5/5 in all extremities. DTRs symmetric bilaterally  Skin: Skin is warm, dry and intact. No rash noted.  Psychiatric: She has a normal mood and affect.    ED Course  Procedures (including critical care time) Labs Review Labs Reviewed  URINALYSIS, ROUTINE W REFLEX MICROSCOPIC - Abnormal; Notable for the following:    APPearance CLOUDY (*)    Nitrite POSITIVE (*)    Leukocytes, UA SMALL (*)    All other components within normal limits  URINE MICROSCOPIC-ADD ON - Abnormal; Notable for the following:    Bacteria, UA MANY (*)    All other components within normal limits    Imaging Review No results found.   EKG Interpretation None      MDM   Final diagnoses:  Bilateral low back pain with left-sided sciatica  UTI (lower urinary tract infection)   [name] is a 48 y.o. female with a PMHx of herniated disc of lumbar spine presenting today with an exacerbation of her lower back pain which occurred yesterday during twisting movement. No red flag s/s of low back pain. No s/s of central cord compression or cauda equina. Lower extremities are neurovascularly intact and patient is ambulating without difficulty. Improved with valium 2mg  and dilaudid IM 1mg . Patient was counseled on back pain precautions and told to do activity as tolerated but do not lift, push, or pull heavy objects more than 10  pounds for the next week. Patient counseled to use ice or heat on back for no longer than 15 minutes every hour. Rx given for muscle relaxer and counseled on proper use of muscle relaxant medication.Rx for mobic and zantac. Rx given for narcotic pain medicine and counseled on proper use of narcotic pain medications. Counseled not to combine this medication with others containing tylenol. Urged patient not to drink alcohol, drive, or perform any other activities that requires focus while taking either of these medications. Patient urged to follow-up with PCP/ortho if pain does not improve with treatment and rest or if pain becomes recurrent. Urged to return with worsening severe pain, loss of bowel or bladder control, trouble walking. The patient verbalizes understanding and agrees with the plan. Pt also found to have  UTI on U/A, nitrite and leuk positive with many bacteria. Allergic to sulfa therefore will give Keflex. Encouraged pt to increase water intake. I explained the diagnosis and have given explicit precautions to return to the ER including for any other new or worsening symptoms. The patient understands and accepts the medical plan as it's been dictated and I have answered their questions. Discharge instructions concerning home care and prescriptions have been given. The patient is STABLE and is discharged to home in good condition.   BP 136/81  Temp(Src) 98.7 F (37.1 C) (Oral)  Resp 12  SpO2 100%   Donnita Falls Camprubi-Soms, PA-C 09/01/13 1111

## 2013-09-02 NOTE — ED Provider Notes (Signed)
Medical screening examination/treatment/procedure(s) were performed by non-physician practitioner and as supervising physician I was immediately available for consultation/collaboration.  Jevaughn Degollado T Madelyne Millikan, MD 09/02/13 0801 

## 2013-12-30 ENCOUNTER — Emergency Department (EMERGENCY_DEPARTMENT_HOSPITAL)
Admission: EM | Admit: 2013-12-30 | Discharge: 2013-12-31 | Disposition: A | Discharge status: Other Acute Inpatient Hospital | Payer: Medicare Other | Source: Home / Self Care | Attending: Emergency Medicine | Admitting: Emergency Medicine

## 2013-12-30 ENCOUNTER — Encounter (HOSPITAL_COMMUNITY): Payer: Self-pay | Admitting: Emergency Medicine

## 2013-12-30 DIAGNOSIS — F319 Bipolar disorder, unspecified: Secondary | ICD-10-CM | POA: Diagnosis not present

## 2013-12-30 DIAGNOSIS — F32A Depression, unspecified: Secondary | ICD-10-CM

## 2013-12-30 DIAGNOSIS — F3130 Bipolar disorder, current episode depressed, mild or moderate severity, unspecified: Secondary | ICD-10-CM | POA: Diagnosis not present

## 2013-12-30 DIAGNOSIS — R45851 Suicidal ideations: Secondary | ICD-10-CM

## 2013-12-30 DIAGNOSIS — F329 Major depressive disorder, single episode, unspecified: Secondary | ICD-10-CM

## 2013-12-30 LAB — COMPREHENSIVE METABOLIC PANEL
ALBUMIN: 4.1 g/dL (ref 3.5–5.2)
ALT: 32 U/L (ref 0–35)
AST: 44 U/L — AB (ref 0–37)
Alkaline Phosphatase: 89 U/L (ref 39–117)
Anion gap: 19 — ABNORMAL HIGH (ref 5–15)
BUN: 10 mg/dL (ref 6–23)
CHLORIDE: 104 meq/L (ref 96–112)
CO2: 19 mEq/L (ref 19–32)
Calcium: 8.9 mg/dL (ref 8.4–10.5)
Creatinine, Ser: 0.87 mg/dL (ref 0.50–1.10)
GFR calc Af Amer: 89 mL/min — ABNORMAL LOW (ref >90)
GFR calc non Af Amer: 77 mL/min — ABNORMAL LOW (ref >90)
Glucose, Bld: 100 mg/dL — ABNORMAL HIGH (ref 70–99)
POTASSIUM: 3.8 meq/L (ref 3.7–5.3)
SODIUM: 142 meq/L (ref 137–147)
Total Bilirubin: <0.2 mg/dL — ABNORMAL LOW (ref 0.3–1.2)
Total Protein: 7.8 g/dL (ref 6.0–8.3)

## 2013-12-30 LAB — CBC
HCT: 44.6 % (ref 36.0–46.0)
Hemoglobin: 14.6 g/dL (ref 12.0–15.0)
MCH: 34.4 pg — ABNORMAL HIGH (ref 26.0–34.0)
MCHC: 32.7 g/dL (ref 30.0–36.0)
MCV: 104.9 fL — AB (ref 78.0–100.0)
PLATELETS: 210 10*3/uL (ref 150–400)
RBC: 4.25 MIL/uL (ref 3.87–5.11)
RDW: 13 % (ref 11.5–15.5)
WBC: 6.4 10*3/uL (ref 4.0–10.5)

## 2013-12-30 LAB — RAPID URINE DRUG SCREEN, HOSP PERFORMED
Amphetamines: NOT DETECTED
BENZODIAZEPINES: NOT DETECTED
Barbiturates: NOT DETECTED
COCAINE: POSITIVE — AB
Opiates: NOT DETECTED
Tetrahydrocannabinol: POSITIVE — AB

## 2013-12-30 LAB — ETHANOL: ALCOHOL ETHYL (B): 155 mg/dL — AB (ref 0–11)

## 2013-12-30 LAB — SALICYLATE LEVEL: Salicylate Lvl: 16.1 mg/dL (ref 2.8–20.0)

## 2013-12-30 LAB — ACETAMINOPHEN LEVEL: ACETAMINOPHEN (TYLENOL), SERUM: 15.4 ug/mL (ref 10–30)

## 2013-12-30 MED ORDER — ALUM & MAG HYDROXIDE-SIMETH 200-200-20 MG/5ML PO SUSP
30.0000 mL | ORAL | Status: DC | PRN
  Administered 2013-12-31 (×2): 30 mL via ORAL
  Filled 2013-12-30 (×2): qty 30

## 2013-12-30 MED ORDER — ZOLPIDEM TARTRATE 5 MG PO TABS: 5.0000 mg | ORAL_TABLET | Freq: Every evening | ORAL | Status: DC | PRN

## 2013-12-30 MED ORDER — LORAZEPAM 1 MG PO TABS
1.0000 mg | ORAL_TABLET | Freq: Three times a day (TID) | ORAL | Status: DC | PRN
  Administered 2013-12-30 – 2013-12-31 (×3): 1 mg via ORAL
  Filled 2013-12-30 (×3): qty 1

## 2013-12-30 MED ORDER — ONDANSETRON HCL 4 MG PO TABS
4.0000 mg | ORAL_TABLET | Freq: Three times a day (TID) | ORAL | Status: DC | PRN
  Administered 2013-12-31 (×2): 4 mg via ORAL
  Filled 2013-12-30 (×2): qty 1

## 2013-12-30 MED ORDER — LORAZEPAM 1 MG PO TABS
1.0000 mg | ORAL_TABLET | Freq: Once | ORAL | Status: AC
  Administered 2013-12-30: 1 mg via ORAL
  Filled 2013-12-30: qty 1

## 2013-12-30 MED ORDER — NICOTINE 21 MG/24HR TD PT24
21.0000 mg | MEDICATED_PATCH | Freq: Every day | TRANSDERMAL | Status: DC
  Administered 2013-12-30 – 2013-12-31 (×2): 21 mg via TRANSDERMAL
  Filled 2013-12-30 (×2): qty 1

## 2013-12-30 MED ORDER — ACETAMINOPHEN 325 MG PO TABS
650.0000 mg | ORAL_TABLET | Freq: Once | ORAL | Status: AC
  Administered 2013-12-30: 650 mg via ORAL
  Filled 2013-12-30: qty 2

## 2013-12-30 NOTE — ED Notes (Signed)
Bed: WA15 Expected date:  Expected time:  Means of arrival:  Comments: Triage 3 

## 2013-12-30 NOTE — ED Notes (Signed)
Pt from home c/o depression and is suicidal stating "[place] mean [place] pretty convenient".  She reports being "sick of people". She ran out of her Paxil and Xanax weeks ago. Pt reports not following up with her psychiatrist. Pt denies recent drug use including marijuana. She reports chronic lower back pain.

## 2013-12-30 NOTE — BH Assessment (Signed)
Tele Assessment Note   [name] is an 48 y.o. female presenting to [facility] ED with the chief complaint of depression. Pt stated "I am dealing with a lot of stress and I am out of some of my med". "I can't deal with society; everything is getting on my nerves".  Pt is endorsing SI but denies a plan. Pt stated "I don't feel like being here but not by no train". "I would take the quickest way out". Pt reported that she has attempted suicide multiple times in the past and shared that she has a history of cutting. Pt did not report any recent self-injurious behaviors. Pt is endorsing multiple depressive symptoms and shared that her sleep and appetite have been poor. Pt also shared that she is dealing with multiple stressors such as financial problems and "the people around me". Pt reported that she is currently receiving medication management but did not report any outpatient therapy treatment. Pt did not report any AVH or HI but shared that she has seen spirits in the past. Pt denies any alcohol or drug abuse; however her UDS is positive for THC and cocaine. Pt's BAL is also 155. Pt did not report any physical, sexual or emotional abuse at this time.  Pt is alert and oriented x3. Pt maintained poor eye contact throughout this assessment. Pt speech is normal and her thought process is logical and coherent. Pt mood is anxious and her affect is flat.  Pt judgment and insight are fair. Pt reported that she lives alone; however she can count on her daughter for support. Pt is unable to reliably contract for safety at this time. Pt stated "if I go home it would be disastrous". Inpatient treatment is recommended for psychiatric stabilization.    Axis I: Bipolar, Depressed and Substance Abuse  Past Medical History:  Past Medical History  Diagnosis Date  . Bipolar 1 disorder   . Schizophrenia   . Anxiety   . Fibromyalgia   . GERD (gastroesophageal reflux disease)   . Hypertension   . Migraine   . Seizures      Past Surgical History  Procedure Laterality Date  . Cesarean section    . Mouth surgery  2014    Family History: No family history on file.  Social History:  reports that she has been smoking Cigarettes.  She has a 15 pack-year smoking history. She has never used smokeless tobacco. She reports that she does not drink alcohol or use illicit drugs.  Additional Social History:  Alcohol / Drug Use History of alcohol / drug use?: Yes (Pt denies drug use; however UDS is positive for THC and cocaine. Pt BAL is also 155.)  CIWA: CIWA-Ar BP: 124/76 mmHg Pulse Rate: 108 COWS:    PATIENT STRENGTHS: (choose at least two) Average or above average intelligence Capable of independent living  Allergies:  Allergies  Allergen Reactions  . Prednisone Shortness Of Breath and Swelling  . Claritin [Loratadine]     Unknown  . Nsaids Other (See Comments)    GERD  . Sulfa Antibiotics Swelling    Home Medications:  (Not in a hospital admission)  OB/GYN Status:  No LMP recorded. Patient is postmenopausal.  General Assessment Data Location of Assessment: [facility] ED Is this a Tele or Face-to-Face Assessment?: Face-to-Face Is this an Initial Assessment or a Re-assessment for this encounter?: Initial Assessment Living Arrangements: Alone Can pt return to current living arrangement?: Yes Admission Status: Voluntary Is patient capable of signing voluntary  admission?: Yes Transfer from: Home Referral Source: Self/Family/Friend     [facility] Living Arrangements: Alone Name of Psychiatrist: Dr. Omelia BlackwaterHeaden  Name of Therapist: No provider reported at this time.   Education Status Is patient currently in school?: No  Risk to self with the past 6 months Suicidal Ideation: Yes-Currently Present Suicidal Intent: Yes-Currently Present Is patient at risk for suicide?: Yes Suicidal Plan?: No ("Quickest way possible".) Access to Means: No What has been your use of drugs/alcohol within the  last 12 months?: Pt denies drug use; however UDS is positive for cocaine and  THC. BAL is 155. Previous Attempts/Gestures: Yes How many times?: 15 ("15") Other Self Harm Risks: No self harm risk identified at this time.  Triggers for Past Attempts: Unpredictable Intentional Self Injurious Behavior: None Family Suicide History: No Recent stressful life event(s): Financial Problems Persecutory voices/beliefs?: No Depression: Yes Depression Symptoms: Despondent, Insomnia, Tearfulness, Isolating, Fatigue, Loss of interest in usual pleasures, Feeling worthless/self pity, Feeling angry/irritable Substance abuse history and/or treatment for substance abuse?: Yes Suicide prevention information given to non-admitted patients: Not applicable  Risk to Others within the past 6 months Homicidal Ideation: No Thoughts of Harm to Others: No Current Homicidal Intent: No Current Homicidal Plan: No Access to Homicidal Means: No Identified Victim: NA History of harm to others?: No Assessment of Violence: None Noted Violent Behavior Description: No violent behaviors observed at this time.  Does patient have access to weapons?: No Criminal Charges Pending?: No Does patient have a court date: No  Psychosis Hallucinations: None noted Delusions: None noted  Mental Status Report Appear/Hygiene: In scrubs Eye Contact: Poor Motor Activity: Freedom of movement Speech: Logical/coherent Level of Consciousness: Quiet/awake Mood: Anxious Affect: Flat Anxiety Level: Moderate Thought Processes: Coherent, Relevant Judgement: Partial Orientation: Person, Place, Situation Obsessive Compulsive Thoughts/Behaviors: None  Cognitive Functioning Concentration: Decreased Memory: Recent Intact IQ: Average Insight: Fair Impulse Control: Fair Appetite: Poor Weight Loss: 40 (Pt reported that she lost 40lbs in the past 3 mos. ) Weight Gain: 0 Sleep: Decreased Total Hours of Sleep: 4 Vegetative Symptoms:  Staying in bed  ADLScreening [facility] Patient's cognitive ability adequate to safely complete daily activities?: Yes Patient able to express need for assistance with ADLs?: Yes Independently performs ADLs?: Yes (appropriate for developmental age)  Prior Inpatient Therapy Prior Inpatient Therapy: Yes Prior Therapy Dates: 2013 [facility] Reason for Treatment: Bipolar, Cannabis abuse  Prior Outpatient Therapy Prior Outpatient Therapy: Yes Prior Therapy Dates: 2014-present Prior Therapy Facilty/Provider(s): Dr. Omelia BlackwaterHeaden Reason for Treatment: Bipolar  ADL Screening (condition at time of admission) Patient's cognitive ability adequate to safely complete daily activities?: Yes Is the patient deaf or have difficulty hearing?: No Does the patient have difficulty seeing, even when wearing glasses/contacts?: No Does the patient have difficulty concentrating, remembering, or making decisions?: No Patient able to express need for assistance with ADLs?: Yes Does the patient have difficulty dressing or bathing?: No Independently performs ADLs?: Yes (appropriate for developmental age)       Abuse/Neglect Assessment (Assessment to be complete while patient is alone) Physical Abuse: Denies Verbal Abuse: Denies Sexual Abuse: Denies Exploitation of patient/patient's resources: Denies Self-Neglect: Denies     Merchant navy officerAdvance Directives (For Healthcare) Does patient have an advance directive?: No Would patient like information on creating an advanced directive?: No - patient declined information    Additional Information 1:1 In Past 12 Months?: No CIRT Risk: No Elopement Risk: No     Disposition:  Disposition Initial  Assessment Completed for this Encounter: Yes Disposition of Patient: Inpatient treatment program Type of inpatient treatment program: Adult  Azir Muzyka S 12/30/2013 8:21 PM

## 2013-12-30 NOTE — ED Provider Notes (Signed)
CSN: [id number]     Arrival date & time 12/30/13  1747 History   First MD Initiated Contact with Patient 12/30/13 1824     Chief Complaint  Patient presents with  . Suicidal  . Depression     (Consider location/radiation/quality/duration/timing/severity/associated sxs/prior Treatment) HPI Comments: This is a 48 year old female with a past medical history of schizophrenia, bipolar 1 disorder, GERD, hypertension, migraines, seizures, fibromyalgia and anxiety who presents to the emergency department complaining of increased depression and suicidal ideations. Patient is stating "I [place], I denies pretty convenient". She is denying any specific plan, just wants to "end it all". She states she is very overwhelmed and "sick of people". She has been off of her psychiatric medications. Denies any alcohol or drug abuse. Denies homicidal ideations.  The history is provided by the patient.    Past Medical History  Diagnosis Date  . Bipolar 1 disorder   . Schizophrenia   . Anxiety   . Fibromyalgia   . GERD (gastroesophageal reflux disease)   . Hypertension   . Migraine   . Seizures    Past Surgical History  Procedure Laterality Date  . Cesarean section    . Mouth surgery  2014   No family history on file. History  Substance Use Topics  . Smoking status: Current Every Day Smoker -- 1.00 packs/day for 15 years    Types: Cigarettes  . Smokeless tobacco: Never Used  . Alcohol Use: No   OB History    No data available     Review of Systems  Psychiatric/Behavioral: Positive for suicidal ideas and dysphoric mood.  All other systems reviewed and are negative.     Allergies  Prednisone; Claritin; Nsaids; and Sulfa antibiotics  Home Medications   Prior to Admission medications   Medication Sig Start Date End Date Taking? Authorizing Provider  albuterol (PROVENTIL HFA;VENTOLIN HFA) 108 (90 BASE) MCG/ACT inhaler Inhale 2 puffs into the lungs every 6 (six)  hours as needed for wheezing or shortness of breath.   Yes Historical Provider, MD  ALPRAZolam Prudy Feeler(XANAX) 1 MG tablet Take 1 mg by mouth 3 (three) times daily.   Yes Historical Provider, MD  Aspirin-Acetaminophen (GOODYS BODY PAIN PO) Take 4 Packages by mouth every 6 (six) hours as needed (pain).    Yes Historical Provider, MD  aspirin-acetaminophen-caffeine (EXCEDRIN MIGRAINE) 7097065675250-250-65 MG per tablet Take 2 tablets by mouth every 8 (eight) hours as needed for migraine.    Yes Historical Provider, MD  ibuprofen (ADVIL,MOTRIN) 200 MG tablet Take 200-400 mg by mouth every 6 (six) hours as needed for fever, headache, mild pain, moderate pain or cramping.   Yes Historical Provider, MD  LORazepam (ATIVAN) 1 MG tablet Take 1 mg by mouth 3 (three) times daily.    Yes Historical Provider, MD  meloxicam (MOBIC) 7.5 MG tablet Take 1 tablet (7.5 mg total) by mouth daily. May take one additional tablet 12 hours after the first dose if pain persists. TAKE WITH FOOD, AND ZANTAC 09/01/13  Yes Mercedes Strupp Camprubi-Soms, PA-C  methocarbamol (ROBAXIN) 500 MG tablet Take 1 tablet (500 mg total) by mouth every 8 (eight) hours as needed for muscle spasms. 09/01/13  Yes Mercedes Strupp Camprubi-Soms, PA-C  oxyCODONE-acetaminophen (PERCOCET) 5-325 MG per tablet Take 1-2 tablets by mouth every 6 (six) hours as needed for severe pain. Patient taking differently: Take 1 tablet by mouth 3 (three) times daily.  09/01/13  Yes Mercedes Strupp Camprubi-Soms, PA-C  QUEtiapine (SEROQUEL) 300  MG tablet Take 300 mg by mouth at bedtime.   Yes Historical Provider, MD  ranitidine (ZANTAC) 150 MG tablet Take 1 tablet (150 mg total) by mouth 2 (two) times daily. While taking Mobic or other NSAIDs 09/01/13  Yes Mercedes Strupp Camprubi-Soms, PA-C  traZODone (DESYREL) 100 MG tablet Take 200 mg by mouth at bedtime as needed for sleep.    Yes Historical Provider, MD  verapamil (CALAN) 40 MG tablet Take 20 mg by mouth daily as needed (high blood  pressure).   Yes Historical Provider, MD  cephALEXin (KEFLEX) 500 MG capsule 2 caps po bid x 7 days 09/01/13   Donnita FallsMercedes Strupp Camprubi-Soms, PA-C  PARoxetine (PAXIL) 40 MG tablet Take 40 mg by mouth daily.    Historical Provider, MD   BP 124/76 mmHg  Pulse 108  Temp(Src) 98.3 F (36.8 C) (Oral)  Resp 20  SpO2 98% Physical Exam  Constitutional: She is oriented to person, place, and time. She appears well-developed and well-nourished. No distress.  HENT:  Head: Normocephalic and atraumatic.  Mouth/Throat: Oropharynx is clear and moist.  Eyes: Conjunctivae and EOM are normal.  Neck: Normal range of motion. Neck supple.  Cardiovascular: Regular rhythm and normal heart sounds.   Tachy.  Pulmonary/Chest: Effort normal and breath sounds normal. No respiratory distress.  Musculoskeletal: Normal range of motion. She exhibits no edema.  Neurological: She is alert and oriented to person, place, and time. No sensory deficit.  Skin: Skin is warm and dry.  Psychiatric: Her affect is blunt. She is withdrawn. She exhibits a depressed mood. She expresses suicidal ideation. She expresses no homicidal ideation. She is inattentive.  Nursing note and vitals reviewed.   ED Course  Procedures (including critical care time) Labs Review Labs Reviewed  CBC - Abnormal; Notable for the following:    MCV 104.9 (*)    MCH 34.4 (*)    All other components within normal limits  COMPREHENSIVE METABOLIC PANEL - Abnormal; Notable for the following:    Glucose, Bld 100 (*)    AST 44 (*)    Total Bilirubin <0.2 (*)    GFR calc non Af Amer 77 (*)    GFR calc Af Amer 89 (*)    Anion gap 19 (*)    All other components within normal limits  ETHANOL - Abnormal; Notable for the following:    Alcohol, Ethyl (B) 155 (*)    All other components within normal limits  URINE RAPID DRUG SCREEN (HOSP PERFORMED) - Abnormal; Notable for the following:    Cocaine POSITIVE (*)    Tetrahydrocannabinol POSITIVE (*)    All  other components within normal limits  ACETAMINOPHEN LEVEL  SALICYLATE LEVEL    Imaging Review No results found.   EKG Interpretation None      MDM   Final diagnoses:  Suicidal ideation   Pt with SI. ETOH 155. [facility].  Kathrynn SpeedRobyn M Terrell Ostrand, PA-C 12/30/13 [id number]  Audree CamelScott T Goldston, MD 01/03/14 [phone]

## 2013-12-30 NOTE — ED Notes (Signed)
Pt eating sandwich.

## 2013-12-30 NOTE — BH Assessment (Signed)
Assessment completed. Consulted Janann Augustori Burkett, NP who recommended inpatient treatment. [facility] will contact other facilities for placement. Robyn Hess, PA-C has been informed of the recommendation.

## 2013-12-31 ENCOUNTER — Inpatient Hospital Stay (HOSPITAL_COMMUNITY)
Admission: EM | Admit: 2013-12-31 | Discharge: 2014-01-05 | DRG: 885 | Disposition: A | Discharge status: Home / Self Care | Payer: Medicare Other | Source: Intra-hospital | Attending: Psychiatry | Admitting: Psychiatry

## 2013-12-31 ENCOUNTER — Encounter (HOSPITAL_COMMUNITY): Payer: Self-pay | Admitting: Psychiatry

## 2013-12-31 DIAGNOSIS — F1220 Cannabis dependence, uncomplicated: Secondary | ICD-10-CM | POA: Diagnosis present

## 2013-12-31 DIAGNOSIS — F3130 Bipolar disorder, current episode depressed, mild or moderate severity, unspecified: Principal | ICD-10-CM | POA: Diagnosis present

## 2013-12-31 DIAGNOSIS — K219 Gastro-esophageal reflux disease without esophagitis: Secondary | ICD-10-CM | POA: Diagnosis present

## 2013-12-31 DIAGNOSIS — Y906 Blood alcohol level of 120-199 mg/100 ml: Secondary | ICD-10-CM | POA: Diagnosis present

## 2013-12-31 DIAGNOSIS — Z79899 Other long term (current) drug therapy: Secondary | ICD-10-CM

## 2013-12-31 DIAGNOSIS — Z599 Problem related to housing and economic circumstances, unspecified: Secondary | ICD-10-CM

## 2013-12-31 DIAGNOSIS — F1994 Other psychoactive substance use, unspecified with psychoactive substance-induced mood disorder: Secondary | ICD-10-CM | POA: Diagnosis present

## 2013-12-31 DIAGNOSIS — F1420 Cocaine dependence, uncomplicated: Secondary | ICD-10-CM | POA: Diagnosis present

## 2013-12-31 DIAGNOSIS — F1020 Alcohol dependence, uncomplicated: Secondary | ICD-10-CM | POA: Diagnosis present

## 2013-12-31 DIAGNOSIS — I10 Essential (primary) hypertension: Secondary | ICD-10-CM | POA: Diagnosis present

## 2013-12-31 DIAGNOSIS — R45851 Suicidal ideations: Secondary | ICD-10-CM | POA: Diagnosis present

## 2013-12-31 DIAGNOSIS — F319 Bipolar disorder, unspecified: Secondary | ICD-10-CM | POA: Diagnosis present

## 2013-12-31 MED ORDER — ACETAMINOPHEN 325 MG PO TABS
650.0000 mg | ORAL_TABLET | Freq: Four times a day (QID) | ORAL | Status: DC | PRN
  Administered 2013-12-31: 650 mg via ORAL
  Filled 2013-12-31: qty 2

## 2013-12-31 MED ORDER — ONDANSETRON 4 MG PO TBDP
4.0000 mg | ORAL_TABLET | Freq: Four times a day (QID) | ORAL | Status: AC | PRN
  Administered 2014-01-02 – 2014-01-03 (×3): 4 mg via ORAL
  Filled 2013-12-31 (×3): qty 1

## 2013-12-31 MED ORDER — IBUPROFEN 200 MG PO TABS
600.0000 mg | ORAL_TABLET | Freq: Four times a day (QID) | ORAL | Status: DC | PRN
  Administered 2013-12-31 (×2): 600 mg via ORAL
  Filled 2013-12-31 (×2): qty 3

## 2013-12-31 MED ORDER — ALBUTEROL SULFATE HFA 108 (90 BASE) MCG/ACT IN AERS: 2.0000 | INHALATION_SPRAY | Freq: Four times a day (QID) | RESPIRATORY_TRACT | Status: DC | PRN

## 2013-12-31 MED ORDER — MELOXICAM 7.5 MG PO TABS
7.5000 mg | ORAL_TABLET | Freq: Every day | ORAL | Status: DC
  Administered 2014-01-01 – 2014-01-03 (×3): 7.5 mg via ORAL
  Filled 2013-12-31 (×5): qty 1

## 2013-12-31 MED ORDER — CHLORDIAZEPOXIDE HCL 25 MG PO CAPS
25.0000 mg | ORAL_CAPSULE | ORAL | Status: AC
  Administered 2014-01-03: 25 mg via ORAL
  Filled 2013-12-31: qty 1

## 2013-12-31 MED ORDER — VITAMIN B-1 100 MG PO TABS
100.0000 mg | ORAL_TABLET | Freq: Every day | ORAL | Status: DC
  Administered 2014-01-02 – 2014-01-04 (×3): 100 mg via ORAL
  Filled 2013-12-31 (×8): qty 1

## 2013-12-31 MED ORDER — QUETIAPINE FUMARATE 300 MG PO TABS
300.0000 mg | ORAL_TABLET | Freq: Every day | ORAL | Status: DC
  Administered 2013-12-31: 300 mg via ORAL
  Filled 2013-12-31: qty 1

## 2013-12-31 MED ORDER — LOPERAMIDE HCL 2 MG PO CAPS: 2.0000 mg | ORAL_CAPSULE | ORAL | Status: AC | PRN

## 2013-12-31 MED ORDER — PAROXETINE HCL 20 MG PO TABS: 20.0000 mg | ORAL_TABLET | Freq: Every day | ORAL | Status: DC

## 2013-12-31 MED ORDER — TRAZODONE HCL 100 MG PO TABS
200.0000 mg | ORAL_TABLET | Freq: Every evening | ORAL | Status: DC | PRN
  Administered 2014-01-01: 200 mg via ORAL
  Filled 2013-12-31: qty 2

## 2013-12-31 MED ORDER — ALUM & MAG HYDROXIDE-SIMETH 200-200-20 MG/5ML PO SUSP: 30.0000 mL | ORAL | Status: DC | PRN

## 2013-12-31 MED ORDER — PAROXETINE HCL 20 MG PO TABS
40.0000 mg | ORAL_TABLET | Freq: Every day | ORAL | Status: DC
  Administered 2014-01-01 – 2014-01-05 (×5): 40 mg via ORAL
  Filled 2013-12-31 (×8): qty 2

## 2013-12-31 MED ORDER — HYDROXYZINE HCL 25 MG PO TABS: 25.0000 mg | ORAL_TABLET | Freq: Four times a day (QID) | ORAL | Status: AC | PRN

## 2013-12-31 MED ORDER — CARBAMAZEPINE ER 200 MG PO CP12
200.0000 mg | ORAL_CAPSULE | Freq: Two times a day (BID) | ORAL | Status: DC
  Administered 2013-12-31 (×2): 200 mg via ORAL
  Filled 2013-12-31 (×2): qty 1

## 2013-12-31 MED ORDER — VERAPAMIL HCL 40 MG PO TABS
20.0000 mg | ORAL_TABLET | Freq: Every day | ORAL | Status: DC | PRN
  Filled 2013-12-31: qty 1

## 2013-12-31 MED ORDER — IBUPROFEN 200 MG PO TABS
600.0000 mg | ORAL_TABLET | Freq: Once | ORAL | Status: AC
  Administered 2013-12-31: 600 mg via ORAL
  Filled 2013-12-31: qty 3

## 2013-12-31 MED ORDER — CHLORDIAZEPOXIDE HCL 25 MG PO CAPS
25.0000 mg | ORAL_CAPSULE | Freq: Four times a day (QID) | ORAL | Status: AC
  Filled 2013-12-31 (×4): qty 1

## 2013-12-31 MED ORDER — PAROXETINE HCL 20 MG PO TABS
40.0000 mg | ORAL_TABLET | Freq: Every day | ORAL | Status: DC
  Administered 2013-12-31: 40 mg via ORAL
  Filled 2013-12-31: qty 2

## 2013-12-31 MED ORDER — METHOCARBAMOL 500 MG PO TABS
500.0000 mg | ORAL_TABLET | Freq: Three times a day (TID) | ORAL | Status: DC | PRN
  Administered 2014-01-01 – 2014-01-04 (×7): 500 mg via ORAL
  Filled 2013-12-31 (×7): qty 1

## 2013-12-31 MED ORDER — MAGNESIUM HYDROXIDE 400 MG/5ML PO SUSP
30.0000 mL | Freq: Every day | ORAL | Status: DC | PRN
  Administered 2014-01-02 – 2014-01-03 (×2): 30 mL via ORAL
  Filled 2013-12-31 (×2): qty 30

## 2013-12-31 MED ORDER — CHLORDIAZEPOXIDE HCL 25 MG PO CAPS
25.0000 mg | ORAL_CAPSULE | Freq: Every day | ORAL | Status: AC
  Administered 2014-01-05: 25 mg via ORAL
  Filled 2013-12-31: qty 1

## 2013-12-31 MED ORDER — CHLORDIAZEPOXIDE HCL 25 MG PO CAPS
25.0000 mg | ORAL_CAPSULE | Freq: Three times a day (TID) | ORAL | Status: AC
  Administered 2014-01-03: 25 mg via ORAL
  Filled 2013-12-31 (×2): qty 1

## 2013-12-31 MED ORDER — ACETAMINOPHEN 325 MG PO TABS
650.0000 mg | ORAL_TABLET | Freq: Four times a day (QID) | ORAL | Status: DC | PRN
  Administered 2014-01-01 – 2014-01-05 (×9): 650 mg via ORAL
  Filled 2013-12-31 (×9): qty 2

## 2013-12-31 MED ORDER — CHLORDIAZEPOXIDE HCL 25 MG PO CAPS: 25.0000 mg | ORAL_CAPSULE | Freq: Four times a day (QID) | ORAL | Status: DC | PRN

## 2013-12-31 MED ORDER — ADULT MULTIVITAMIN W/MINERALS CH
1.0000 | ORAL_TABLET | Freq: Every day | ORAL | Status: DC
  Filled 2013-12-31 (×8): qty 1

## 2013-12-31 MED ORDER — FAMOTIDINE 20 MG PO TABS
20.0000 mg | ORAL_TABLET | Freq: Two times a day (BID) | ORAL | Status: DC
  Administered 2014-01-01 – 2014-01-05 (×7): 20 mg via ORAL
  Filled 2013-12-31 (×17): qty 1

## 2013-12-31 MED ORDER — QUETIAPINE FUMARATE 300 MG PO TABS
300.0000 mg | ORAL_TABLET | Freq: Every day | ORAL | Status: DC
  Administered 2014-01-01: 300 mg via ORAL
  Filled 2013-12-31 (×4): qty 1

## 2013-12-31 MED ORDER — THIAMINE HCL 100 MG/ML IJ SOLN: 100.0000 mg | Freq: Once | INTRAMUSCULAR | Status: DC

## 2013-12-31 MED ORDER — TRAZODONE HCL 100 MG PO TABS: 200.0000 mg | ORAL_TABLET | Freq: Every evening | ORAL | Status: DC | PRN

## 2013-12-31 NOTE — ED Notes (Signed)
Pt has one belonging bag in locker #28.

## 2013-12-31 NOTE — Consult Note (Signed)
[place] Psychiatry Consult   Reason for Consult:  Suicidal ideation Referring Physician:  EPD  [name] is an 48 y.o. female. Total Time spent with patient: 15 minutes  Assessment: AXIS I:  Bipolar, Depressed AXIS II:  Deferred AXIS III:   Past Medical History  Diagnosis Date  . Bipolar 1 disorder   . Schizophrenia   . Anxiety   . Fibromyalgia   . GERD (gastroesophageal reflux disease)   . Hypertension   . Migraine   . Seizures    AXIS IV:  other psychosocial or environmental problems, problems related to social environment and problems with access to health care services AXIS V:  31-40 impairment in reality testing  Plan:  Recommend psychiatric Inpatient admission when medically cleared.  Subjective:   [name] is a 48 y.o. female patient admitted with suicidal ideation. Patient stated on admission to the emergency department that she would take the quickest way out.  Patient has a history of  Multiple suicide attempts and shared that she has a history of cutting.    HPI:  Patient continues to endorse suicidal ideation without a specific plan.  Stating that she has been under a lot of stress recently as she cannot afford her medications, and lacks transportation to get to appointment.  Patient states she ran out of her medication several weeks ago but feels that she needs to restart these to become stable.  Patient see Dr. Martie [name] outpatient but has not followed up with her outpatient psychiatrist in several months.  Patient denies homicidal ideation.  Denies auditory or visual hallucinations.  No evidence of delusions.  Patient UDS was positive for cocaine and THC at time of admission.   HPI Elements:   Location:  generalized depression. Quality:  acute. Severity:  severe. Timing:  continuous. Duration:  exacerbation over past few weeks. Context:  medication non adherance .  Past Psychiatric History: Past Medical History  Diagnosis Date  . Bipolar 1  disorder   . Schizophrenia   . Anxiety   . Fibromyalgia   . GERD (gastroesophageal reflux disease)   . Hypertension   . Migraine   . Seizures     reports that she has been smoking Cigarettes.  She has a 15 pack-year smoking history. She has never used smokeless tobacco. She reports that she uses illicit drugs (Cocaine and Marijuana) about once per week. She reports that she does not drink alcohol. History reviewed. No pertinent family history. Family History Substance Abuse: No Family Supports: Yes, List: (Daughter) Living Arrangements: Alone Can pt return to current living arrangement?: Yes Abuse/Neglect [facility]) Physical Abuse: Denies Verbal Abuse: Denies Sexual Abuse: Denies Allergies:   Allergies  Allergen Reactions  . Prednisone Shortness Of Breath and Swelling  . Claritin [Loratadine]     Unknown  . Nsaids Other (See Comments)    GERD  . Sulfa Antibiotics Swelling    ACT Assessment Complete:  Yes:    Educational Status    Risk to Self: Risk to self with the past 6 months Suicidal Ideation: Yes-Currently Present Suicidal Intent: Yes-Currently Present Is patient at risk for suicide?: Yes Suicidal Plan?: No ("Quickest way possible".) Access to Means: No What has been your use of drugs/alcohol within the last 12 months?: Pt denies drug use; however UDS is positive for cocaine and  THC. BAL is 155. Previous Attempts/Gestures: Yes How many times?: 15 ("15") Other Self Harm Risks: No self harm risk identified at this time.  Triggers for Past  Attempts: Unpredictable Intentional Self Injurious Behavior: None Family Suicide History: No Recent stressful life event(s): Financial Problems Persecutory voices/beliefs?: No Depression: Yes Depression Symptoms: Despondent, Insomnia, Tearfulness, Isolating, Fatigue, Loss of interest in usual pleasures, Feeling worthless/self pity, Feeling angry/irritable Substance abuse history and/or treatment for substance abuse?: Yes Suicide  prevention information given to non-admitted patients: Not applicable  Risk to Others: Risk to Others within the past 6 months Homicidal Ideation: No Thoughts of Harm to Others: No Current Homicidal Intent: No Current Homicidal Plan: No Access to Homicidal Means: No Identified Victim: NA History of harm to others?: No Assessment of Violence: None Noted Violent Behavior Description: No violent behaviors observed at this time.  Does patient have access to weapons?: No Criminal Charges Pending?: No Does patient have a court date: No  Abuse: Abuse/Neglect Assessment (Assessment to be complete while patient is alone) Physical Abuse: Denies Verbal Abuse: Denies Sexual Abuse: Denies Exploitation of patient/patient's resources: Denies Self-Neglect: Denies  Prior Inpatient Therapy: Prior Inpatient Therapy Prior Inpatient Therapy: Yes Prior Therapy Dates: 2013 Prior Therapy Facilty/Provider(s): [facility] Reason for Treatment: Bipolar, Cannabis abuse  Prior Outpatient Therapy: Prior Outpatient Therapy Prior Outpatient Therapy: Yes Prior Therapy Dates: 2014-present Prior Therapy Facilty/Provider(s): Dr. Rosine Door Reason for Treatment: Bipolar  Additional Information: Additional Information 1:1 In Past 12 Months?: No CIRT Risk: No Elopement Risk: No                  Objective: Blood pressure 132/78, pulse 80, temperature 98.3 F (36.8 C), temperature source Oral, resp. rate 17, SpO2 97 %.There is no weight on file to calculate BMI. Results for orders placed or performed during the hospital encounter of 12/30/13 (from the past 72 hour(s))  Urine Drug Screen     Status: Abnormal   Collection Time: 12/30/13  6:26 PM  Result Value Ref Range   Opiates NONE DETECTED NONE DETECTED   Cocaine POSITIVE (A) NONE DETECTED   Benzodiazepines NONE DETECTED NONE DETECTED   Amphetamines NONE DETECTED NONE DETECTED   Tetrahydrocannabinol POSITIVE (A) NONE DETECTED   Barbiturates NONE  DETECTED NONE DETECTED    Comment:        DRUG SCREEN FOR MEDICAL PURPOSES ONLY.  IF CONFIRMATION IS NEEDED FOR ANY PURPOSE, NOTIFY LAB WITHIN 5 DAYS.        LOWEST DETECTABLE LIMITS FOR URINE DRUG SCREEN Drug Class       Cutoff (ng/mL) Amphetamine      1000 Barbiturate      200 Benzodiazepine   638 Tricyclics       756 Opiates          300 Cocaine          300 THC              50   Acetaminophen level     Status: None   Collection Time: 12/30/13  6:39 PM  Result Value Ref Range   Acetaminophen (Tylenol), Serum 15.4 10 - 30 ug/mL    Comment:        THERAPEUTIC CONCENTRATIONS VARY SIGNIFICANTLY. A RANGE OF 10-30 ug/mL MAY BE AN EFFECTIVE CONCENTRATION FOR MANY PATIENTS. HOWEVER, SOME ARE BEST TREATED AT CONCENTRATIONS OUTSIDE THIS RANGE. ACETAMINOPHEN CONCENTRATIONS >150 ug/mL AT 4 HOURS AFTER INGESTION AND >50 ug/mL AT 12 HOURS AFTER INGESTION ARE OFTEN ASSOCIATED WITH TOXIC REACTIONS.   CBC     Status: Abnormal   Collection Time: 12/30/13  6:39 PM  Result Value Ref Range   WBC 6.4 4.0 - 10.5 K/uL  RBC 4.25 3.87 - 5.11 MIL/uL   Hemoglobin 14.6 12.0 - 15.0 g/dL   HCT 44.6 36.0 - 46.0 %   MCV 104.9 (H) 78.0 - 100.0 fL   MCH 34.4 (H) 26.0 - 34.0 pg   MCHC 32.7 30.0 - 36.0 g/dL   RDW 13.0 11.5 - 15.5 %   Platelets 210 150 - 400 K/uL  Comprehensive metabolic panel     Status: Abnormal   Collection Time: 12/30/13  6:39 PM  Result Value Ref Range   Sodium 142 137 - 147 mEq/L   Potassium 3.8 3.7 - 5.3 mEq/L   Chloride 104 96 - 112 mEq/L   CO2 19 19 - 32 mEq/L   Glucose, Bld 100 (H) 70 - 99 mg/dL   BUN 10 6 - 23 mg/dL   Creatinine, Ser 0.87 0.50 - 1.10 mg/dL   Calcium 8.9 8.4 - 10.5 mg/dL   Total Protein 7.8 6.0 - 8.3 g/dL   Albumin 4.1 3.5 - 5.2 g/dL   AST 44 (H) 0 - 37 U/L   ALT 32 0 - 35 U/L   Alkaline Phosphatase 89 39 - 117 U/L   Total Bilirubin <0.2 (L) 0.3 - 1.2 mg/dL   GFR calc non Af Amer 77 (L) >90 mL/min   GFR calc Af Amer 89 (L) >90 mL/min     Comment: (NOTE) The eGFR has been calculated using the CKD EPI equation. This calculation has not been validated in all clinical situations. eGFR's persistently <90 mL/min signify possible Chronic Kidney Disease.    Anion gap 19 (H) 5 - 15  Ethanol (ETOH)     Status: Abnormal   Collection Time: 12/30/13  6:39 PM  Result Value Ref Range   Alcohol, Ethyl (B) 155 (H) 0 - 11 mg/dL    Comment:        LOWEST DETECTABLE LIMIT FOR SERUM ALCOHOL IS 11 mg/dL FOR MEDICAL PURPOSES ONLY   Salicylate level     Status: None   Collection Time: 12/30/13  6:39 PM  Result Value Ref Range   Salicylate Lvl 79.0 2.8 - 20.0 mg/dL   Labs are reviewed and are pertinent for UDS positive for THC and cocaine   Elevated AST at 44 u/l .  Current Facility-Administered Medications  Medication Dose Route Frequency Provider Last Rate Last Dose  . acetaminophen (TYLENOL) tablet 650 mg  650 mg Oral Q6H PRN Kennedy Bucker, NP      . albuterol (PROVENTIL HFA;VENTOLIN HFA) 108 (90 BASE) MCG/ACT inhaler 2 puff  2 puff Inhalation Q6H PRN Kennedy Bucker, NP      . alum & mag hydroxide-simeth (MAALOX/MYLANTA) 200-200-20 MG/5ML suspension 30 mL  30 mL Oral PRN Hessie Diener Hess, PA-C   30 mL at 12/31/13 1630  . carbamazepine (EQUETRO) 12 hr capsule 200 mg  200 mg Oral BID Tritia Endo   200 mg at 12/31/13 1323  . ibuprofen (ADVIL,MOTRIN) tablet 600 mg  600 mg Oral Q6H PRN Kennedy Bucker, NP   600 mg at 12/31/13 1323  . LORazepam (ATIVAN) tablet 1 mg  1 mg Oral Q8H PRN Carman Ching, PA-C   1 mg at 12/31/13 1744  . nicotine (NICODERM CQ - dosed in mg/24 hours) patch 21 mg  21 mg Transdermal Daily Robyn M Hess, PA-C   21 mg at 12/31/13 1108  . ondansetron (ZOFRAN) tablet 4 mg  4 mg Oral Q8H PRN Carman Ching, PA-C   4 mg at 12/31/13 1501  . PARoxetine (PAXIL)  tablet 40 mg  40 mg Oral Daily Kennedy Bucker, NP      . QUEtiapine (SEROQUEL) tablet 300 mg  300 mg Oral QHS Kennedy Bucker, NP      . traZODone (DESYREL) tablet 200 mg  200  mg Oral QHS PRN Kennedy Bucker, NP      . verapamil (CALAN) tablet 20 mg  20 mg Oral Daily PRN Kennedy Bucker, NP       Current Outpatient Prescriptions  Medication Sig Dispense Refill  . albuterol (PROVENTIL HFA;VENTOLIN HFA) 108 (90 BASE) MCG/ACT inhaler Inhale 2 puffs into the lungs every 6 (six) hours as needed for wheezing or shortness of breath.    . ALPRAZolam (XANAX) 1 MG tablet Take 1 mg by mouth 3 (three) times daily.    . Aspirin-Acetaminophen (GOODYS BODY PAIN PO) Take 4 Packages by mouth every 6 (six) hours as needed (pain).     Marland Kitchen aspirin-acetaminophen-caffeine (EXCEDRIN MIGRAINE) 250-250-65 MG per tablet Take 2 tablets by mouth every 8 (eight) hours as needed for migraine.     Marland Kitchen ibuprofen (ADVIL,MOTRIN) 200 MG tablet Take 200-400 mg by mouth every 6 (six) hours as needed for fever, headache, mild pain, moderate pain or cramping.    Marland Kitchen LORazepam (ATIVAN) 1 MG tablet Take 1 mg by mouth 3 (three) times daily.     . meloxicam (MOBIC) 7.5 MG tablet Take 1 tablet (7.5 mg total) by mouth daily. May take one additional tablet 12 hours after the first dose if pain persists. TAKE WITH FOOD, AND ZANTAC 30 tablet 0  . methocarbamol (ROBAXIN) 500 MG tablet Take 1 tablet (500 mg total) by mouth every 8 (eight) hours as needed for muscle spasms. 15 tablet 0  . oxyCODONE-acetaminophen (PERCOCET) 5-325 MG per tablet Take 1-2 tablets by mouth every 6 (six) hours as needed for severe pain. (Patient taking differently: Take 1 tablet by mouth 3 (three) times daily. ) 10 tablet 0  . QUEtiapine (SEROQUEL) 300 MG tablet Take 300 mg by mouth at bedtime.    . traZODone (DESYREL) 100 MG tablet Take 200 mg by mouth at bedtime as needed for sleep.     . verapamil (CALAN) 40 MG tablet Take 20 mg by mouth daily as needed (high blood pressure).    Marland Kitchen PARoxetine (PAXIL) 40 MG tablet Take 40 mg by mouth daily.      Psychiatric Specialty Exam:     Blood pressure 132/78, pulse 80, temperature 98.3 F (36.8 C),  temperature source Oral, resp. rate 17, SpO2 97 %.There is no weight on file to calculate BMI.  General Appearance: Disheveled  Eye Contact::  Minimal  Speech:  Clear and Coherent  Volume:  Increased  Mood:  Irritable depressed  Affect:  Congruent  Thought Process:  Circumstantial, Goal Directed and Tangential  Orientation:  Full (Time, Place, and Person)  Thought Content:  denies AVH  no evidence of delusions  Suicidal Thoughts:  Yes.  with intent/plan  Homicidal Thoughts:  No  Memory:  Immediate;   Fair Recent;   Fair Remote;   Fair  Judgement:  Impaired  Insight:  Lacking  Psychomotor Activity:  Increased  Concentration:  Fair  Recall:  [employer] of Knowledge:Fair  Language: Good  Akathisia:  No  Handed:  Right  AIMS (if indicated):     Assets:  Communication Skills Resilience Social Support housing   Sleep:      Musculoskeletal: Strength & Muscle Tone: within normal limits Gait & Station: normal Patient leans:  N/A  Treatment Plan Summary: Daily contact with patient to assess and evaluate symptoms and progress in treatment Medication management admit to inpatient psychiatric for stabilization of mood and to address addiction issues.   Restarted Paxil 40 mg daily , Per. Dr. Darleene Cleaver Mood stabilizer Carbamazepine 200 mg BID started. For mood stabilization    Kennedy Bucker PMH-NP  12/31/2013 5:54 PM  Patient seen, evaluated and I agree with notes by Nurse Practitioner. Corena Pilgrim, MD

## 2013-12-31 NOTE — ED Notes (Signed)
Pt up to nurses station requesting pain medication. Informed pt that no additional orders had been placed yet, but RN would medicate her as soon as orders were received. Pt given 3 hot packs to use for back pain. Pt made 10 min phone call at desk.

## 2013-12-31 NOTE — ED Provider Notes (Signed)
Pt stable awaiting placement  Kristie LennertJoseph L Damiana Berrian, MD 12/31/13 [phone]

## 2013-12-31 NOTE — ED Notes (Signed)
Pt's belongings moved to [employer] [address].

## 2013-12-31 NOTE — ED Notes (Signed)
Pt c/o back pain, requesting additional meds. Spoke with Vance GatherShelley, NP, received verbal order for PRN ibuprofen. Pt medicated. Also spoke with Vance GatherShelley regarding pt's home meds at pt request.

## 2013-12-31 NOTE — ED Notes (Signed)
Pt in shower currently. TTS has rounded on pt.

## 2013-12-31 NOTE — ED Notes (Signed)
Sitter at bedside. Pt reports pain in back, unrelieved by tylenol or ibuprofen. Requesting additional pain meds. Encouraged to speak with TTS team about this during rounds. Reports SI r/t "not being able to handle things anymore." Sts she is bipolar and her mood can switch very quickly and she will not even notice it. Sts she has ran out of her meds, but not all of them. This is due to just not making an appt to see psychiatrist/therapist. Speaks of wanting to call her daughter and see her. Explained phone and visiting policies to pt, who voices understanding and agreement.

## 2013-12-31 NOTE — ED Notes (Signed)
1 pt belonging bag in locker #31

## 2013-12-31 NOTE — ED Notes (Signed)
Patient complains of indegestion and chronic lower back pain. Patient will be medicated per MAR. Respirations equal and unlabored, skin warm and dry, No acute distress noted. Will continue to monitor patient.

## 2013-12-31 NOTE — ED Notes (Signed)
Pt transported to [facility] by [facility] transportation service for continuation of specialized care. She left in no acute distress. 

## 2013-12-31 NOTE — ED Notes (Signed)
Patient complains of nausea at this time no vomiting noted. Respirations equal and unlabored, skin warm and dry. No acute distress noted.

## 2014-01-01 ENCOUNTER — Encounter (HOSPITAL_COMMUNITY): Payer: Self-pay | Admitting: Behavioral Health

## 2014-01-01 DIAGNOSIS — R45851 Suicidal ideations: Secondary | ICD-10-CM

## 2014-01-01 MED ORDER — KETOROLAC TROMETHAMINE 10 MG PO TABS
10.0000 mg | ORAL_TABLET | Freq: Four times a day (QID) | ORAL | Status: DC | PRN
  Administered 2014-01-01 – 2014-01-05 (×11): 10 mg via ORAL
  Filled 2014-01-01 (×9): qty 1

## 2014-01-01 MED ORDER — NICOTINE 21 MG/24HR TD PT24
21.0000 mg | MEDICATED_PATCH | Freq: Every day | TRANSDERMAL | Status: DC
  Administered 2014-01-01: 21 mg via TRANSDERMAL

## 2014-01-01 MED ORDER — NICOTINE 21 MG/24HR TD PT24
21.0000 mg | MEDICATED_PATCH | Freq: Every day | TRANSDERMAL | Status: DC
  Administered 2014-01-02 – 2014-01-05 (×4): 21 mg via TRANSDERMAL
  Filled 2014-01-01 (×6): qty 1

## 2014-01-01 MED ORDER — TRAZODONE HCL 100 MG PO TABS
100.0000 mg | ORAL_TABLET | Freq: Every evening | ORAL | Status: DC | PRN
  Administered 2014-01-01: 100 mg via ORAL
  Filled 2014-01-01: qty 1

## 2014-01-01 MED ORDER — IBUPROFEN 600 MG PO TABS
600.0000 mg | ORAL_TABLET | Freq: Four times a day (QID) | ORAL | Status: DC | PRN
  Administered 2014-01-01 – 2014-01-03 (×4): 600 mg via ORAL
  Filled 2014-01-01 (×5): qty 1

## 2014-01-01 NOTE — Tx Team (Addendum)
Interdisciplinary Treatment Plan Update   Date Reviewed:  01/01/2014  Time Reviewed:  8:55 AM  Progress in Treatment:   Attending groups: Yes Participating in groups: Yes Taking medication as prescribed: Yes  Tolerating medication: Yes Family/Significant other contact made:  No, but will ask patient for consent for collateral contact Patient understands diagnosis: Yes  Discussing patient identified problems/goals with staff: Yes Medical problems stabilized or resolved: Yes Denies suicidal/homicidal ideation: Yes Patient has not harmed self or others: Yes  For review of initial/current patient goals, please see plan of care.  Estimated Length of Stay:  2-5 days  Reasons for Continued Hospitalization:  Anxiety Depression Medication stabilization   New Problems/Goals identified:    Discharge Plan or Barriers:   Home with outpatient follow up to be determined  Additional Comments:    [name] is an 48 y.o. female [facility] with the chief complaint of depression. Pt stated "I am dealing with a lot of stress and I am out of some of my med". "I can't deal with society; everything is getting on my nerves". Pt is endorsing SI but denies a plan. Pt stated "I don't feel like being here but not by no train". "I would take the quickest way out". Pt reported that she has attempted suicide multiple times in the past and shared that she has a history of cutting. Pt did not report any recent self-injurious behaviors. Pt is endorsing multiple depressive symptoms and shared that her sleep and appetite have been poor. Pt also shared that she is dealing with multiple stressors such as financial problems and "the people around me". Pt reported that she is currently receiving medication management but did not report any outpatient therapy treatment. Pt did not report any AVH or HI but shared that she has seen spirits in the past. Pt denies any alcohol or drug abuse; however her UDS is  positive for THC and cocaine. Pt's BAL is also 155. Pt did not report any physical, sexual or emotional abuse at this time. Pt is alert and oriented x3. Pt maintained poor eye contact throughout this assessment. Pt speech is normal and her thought process is logical and coherent. Pt mood is anxious and her affect is flat. Pt judgment and insight are fair. Pt reported that she lives alone; however she can count on her daughter for support. Pt is unable to reliably contract for safety at this time. Pt stated "if I go home it would be disastrous".   Patient and CSW will meet to review treatment plan/goals.   Attendees:  Patient:  01/01/2014 8:55 AM   Signature:  Sallyanne HaversF. Cobos, MD 01/01/2014 8:55 AM  Signature: 01/01/2014 8:55 AM  Signature:  Rodman KeyJanet Webb, RN 01/01/2014 8:55 AM  Signature: Celso Sicklearolina Beaudry, RN 01/01/2014 8:55 AM  Signature:  Leighton ParodyBritney Tyson, RN 01/01/2014 8:55 AM  Signature:  Kristie PatchQuylle Garrin Kirwan, LCSW 01/01/2014 8:55 AM  Signature:  Belenda CruiseKristin Drinkard, LCSW-A 01/01/2014 8:55 AM  Signature:  Leisa LenzValerie Enoch, Care Coordinator [facility] 01/01/2014 8:55 AM  Signature:  01/01/2014 8:55 AM  Signature: 01/01/2014  8:55 AM  Signature:   Onnie BoerJennifer Clark, RN [facility] 01/01/2014  8:55 AM  Signature:   01/01/2014  8:55 AM    Scribe for Treatment Team:   Kristie PatchQuylle Mable Dara,  01/01/2014 8:55 AM

## 2014-01-01 NOTE — Progress Notes (Signed)
[facility] Admission Note: 48 y/o female who presents voluntarily for Depression, SI, anxiety.  Patient states she has been currently living alone and having financial difficulties.  Patient states she is unable to afford her medications and other things.  Patient states she is unsure of she will be evicted or not.  Patient states she was SI with no plan.  Patient denies ETOH and drug use but did states she drank alcohol before admission.  Patient states she takes Ativan for her anxiety but  Her Ativan bottle was empty on admission and she had other medications in bottles.  Patient states she has a history of Chronic back pain and goes to a pain clinic.  Patient did not have pain medications in here belongings.  Patient states she has 2 daughters but states they are not supportive.  Skin assessed and patient has no skin issues but has a tattoo to right leg.  Consents obtained, fall safety plan explained and patient verbalized understanding.  Patient belongings secured in [facility] [address].  Patient escorted and oriented to the unit.  Patient offered no additional questions or concerns.

## 2014-01-01 NOTE — [facility] Suicide Risk Assessment (Signed)
   Nursing information obtained from:  Patient Demographic factors:  Unemployed, Low socioeconomic status, Caucasian Current Mental Status:  Suicidal ideation indicated by patient, Self-harm thoughts Loss Factors:  Financial problems / change in socioeconomic status, Loss of significant relationship Historical Factors:  Prior suicide attempts Risk Reduction Factors:  Sense of responsibility to family Total Time spent with patient: 45 minutes  CLINICAL FACTORS:  Depression and Anxiety, in the context of severe psychosocial stressors     Psychiatric Specialty Exam: Physical Exam  ROS  Blood pressure 108/70, pulse 90, temperature 98.4 F (36.9 C), temperature source Oral, resp. rate 18, height 5' 6.14" (1.68 m), weight 60.782 kg (134 lb), SpO2 96 %.Body mass index is 21.54 kg/(m^2).  General Appearance: Fairly Groomed  Patent attorneyye Contact::  Good  Speech:  Normal Rate  Volume:  Normal  Mood:  Anxious and Depressed  Affect:  Congruent  Thought Process:  Goal Directed and Linear  Orientation:  Other:  fully alert and attentive  Thought Content:  denies hallucinations, no delusions  Suicidal Thoughts:  Yes.  without intent/plan- at this time denies any plan or intention of hurting self and contracts for safety on the unit  Homicidal Thoughts:  No  Memory:  recent and remote grossly intact   Judgement:  Fair  Insight:  Fair  Psychomotor Activity:  Normal  Concentration:  Good  Recall:  Good  Fund of Knowledge:Good  Language: Good  Akathisia:  Negative  Handed:  Right  AIMS (if indicated):     Assets:  Desire for Improvement Resilience  Sleep:  Number of Hours: 5.5   Musculoskeletal: Strength & Muscle Tone: within normal limits Gait & Station: normal Patient leans: N/A  COGNITIVE FEATURES THAT CONTRIBUTE TO RISK:  Closed-mindedness    SUICIDE RISK:   Moderate:  Frequent suicidal ideation with limited intensity, and duration, some specificity in terms of plans, no associated  intent, good self-control, limited dysphoria/symptomatology, some risk factors present, and identifiable protective factors, including available and accessible social support.  PLAN OF CARE: Patient will be admitted to inpatient psychiatric unit for stabilization and safety. Will provide and encourage milieu participation. Provide medication management and maked adjustments as needed.  Will follow daily.    I certify that inpatient services furnished can reasonably be expected to improve the patient's condition.  Key Cen 01/01/2014, 3:54 PM

## 2014-01-01 NOTE — Progress Notes (Signed)
D: Patient denies SI/HI and A/V hallucinations; patient is having complaints about pain, anxiety, and medications and that she needs her medication for anxiety  A: Monitored q 15 minutes; patient encouraged to attend groups; patient educated about medications; patient given medications per physician orders; patient encouraged to express feelings and/or concerns  R: Patient is very assertive and exhibits medication seeking behaviors and requires frequent interventions; patient is refusing the librium because it does not help with her anxiety; patient's interaction with staff and peers is appropriate; patient can be irritable at times.; patient was able to set goal to talk with staff 1:1 when having feelings of SI; patient is taking medications as prescribed and tolerating medications; patient is not attending groups

## 2014-01-01 NOTE — Progress Notes (Signed)
Recreation Therapy Notes  Animal-Assisted Activity/Therapy (AAA/T) Program Checklist/Progress Notes Patient Eligibility Criteria Checklist & Daily Group note for Rec Tx Intervention  Date: 11.17.2015 Time: 2:45pm Location: [address] Day[address]    AAA/T Program Assumption of Risk Form signed by Patient/ or Parent Legal Guardian yes  Patient is free of allergies or sever asthma yes  Patient reports no fear of animals yes  Patient reports no history of cruelty to animals yes   Patient understands his/her participation is voluntary yes  Patient washes hands before animal contact yes  Patient washes hands after animal contact yes  Behavioral Response: Appropriate    Education: Hand Washing, Appropriate Animal Interaction   Education Outcome: Acknowledges education.   Clinical Observations/Feedback: Patient indicated on consent form she wanted to refuse all AAA/T services during admission. When informed session was starting patient remained in room, LRT verified patient consent form correct and patient wishes were properly documented. Patient indicated that she did not wish to participate because she does not want dog hair on her. Upon being informed therapy dog sheds patient left session, however returned approximately 10 minutes later and remained in session for approximately 5 minutes. Patient appropriate while in session.   Marykay Lexenise L Krishna Dancel, LRT/CTRS  Dyan Labarbera L 01/01/2014 4:10 PM

## 2014-01-01 NOTE — [facility] Group Notes (Signed)
[facility] LCSW Group Therapy      Feelings About Diagnosis 1:15 - 2:30 PM         01/01/2014    Type of Therapy:  Group Therapy  Participation Level:  Did not attend group - patient in bed.  Wynn BankerHodnett, Burnis Halling Hairston 01/01/2014
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
## 2014-01-01 NOTE — Tx Team (Signed)
Initial Interdisciplinary Treatment Plan   PATIENT STRESSORS: Financial difficulties Marital or family conflict Medication change or noncompliance   PATIENT STRENGTHS: Ability for insight Wellsite geologistCommunication skills General fund of knowledge Motivation for treatment/growth   PROBLEM LIST: Problem List/Patient Goals Date to be addressed Date deferred Reason deferred Estimated date of resolution  Depression 12/31/2013     SI with no plan 12/31/2013     Substance abuse but states she is not using drugs or alcohol but uds +cocaine marijuana 12/31/2013                                          DISCHARGE CRITERIA:  Ability to meet basic life and health needs Adequate post-discharge living arrangements Improved stabilization in mood, thinking, and/or behavior Motivation to continue treatment in a less acute level of care Reduction of life-threatening or endangering symptoms to within safe limits Verbal commitment to aftercare and medication compliance Withdrawal symptoms are absent or subacute and managed without 24-hour nursing intervention  PRELIMINARY DISCHARGE PLAN: Attend aftercare/continuing care group Attend PHP/IOP Placement in alternative living arrangements Return to previous living arrangement  PATIENT/FAMIILY INVOLVEMENT: This treatment plan has been presented to and reviewed with the patient, [name].  The patient and family have been given the opportunity to ask questions and make suggestions.  Angeline SlimHill, Ashley M 01/01/2014, 12:15 AM
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
## 2014-01-01 NOTE — Progress Notes (Signed)
Patient ID: [name], female   DOB: September 16, 1965, 48 y.o.   MRN: [id number] PER STATE REGULATIONS 482.30  THIS CHART WAS REVIEWED FOR MEDICAL NECESSITY WITH RESPECT TO THE PATIENT'S ADMISSION/ DURATION OF STAY.  NEXT REVIEW DATE: 01/04/2014 Willa RoughJENNIFER JONES Lucile Didonato, RN, BSN CASE MANAGER

## 2014-01-01 NOTE — [facility] Group Notes (Signed)
[facility] Group Notes:  stress  Date:  01/01/2014  Time:  9:33 AM  Type of Therapy:  Nurse Education  Participation Level:  Did Not Attend  Participation Quality:  Inattentive  Affect:  Flat  Cognitive:  Lacking  Insight:  None  Engagement in Group:  None  Modes of Intervention:  Discussion  Summary of Progress/Problems:  [name], [name] 01/01/2014, 9:33 AM

## 2014-01-02 LAB — TSH: TSH: 1.33 u[IU]/mL (ref 0.350–4.500)

## 2014-01-02 MED ORDER — ENSURE COMPLETE PO LIQD
237.0000 mL | Freq: Three times a day (TID) | ORAL | Status: DC
  Administered 2014-01-02 – 2014-01-05 (×9): 237 mL via ORAL

## 2014-01-02 MED ORDER — QUETIAPINE FUMARATE 400 MG PO TABS
400.0000 mg | ORAL_TABLET | Freq: Every day | ORAL | Status: DC
  Administered 2014-01-02 – 2014-01-04 (×3): 400 mg via ORAL
  Filled 2014-01-02 (×5): qty 1
  Filled 2014-01-02: qty 2

## 2014-01-02 MED ORDER — CARBAMAZEPINE 200 MG PO TABS
200.0000 mg | ORAL_TABLET | Freq: Two times a day (BID) | ORAL | Status: DC
  Administered 2014-01-02 – 2014-01-05 (×6): 200 mg via ORAL
  Filled 2014-01-02 (×11): qty 1

## 2014-01-02 MED ORDER — LORAZEPAM 1 MG PO TABS
1.0000 mg | ORAL_TABLET | Freq: Three times a day (TID) | ORAL | Status: DC | PRN
  Administered 2014-01-02 – 2014-01-05 (×8): 1 mg via ORAL
  Filled 2014-01-02 (×8): qty 1

## 2014-01-02 NOTE — Progress Notes (Signed)
[facility] MD Progress Note  01/02/2014 5:55 PM [name]  MRN:  [id number] Subjective:  Patient states that she feels her medications " are not the right ones yet". She states that she has been on Carbamazepine at 200 mgrs BID , prescribed for her Bipolar Disorder, and states that this medication has been very effective for her. Does not remember having had side effects.  She states she also takes Ativan up to 3 times a day. Objective: I have discussed case with treatment team and have met with patient. As per staff patient has reported significant anxiety and depression ,but denies any suicidal ideations on unit.  Patient is denying any symptoms of alcohol WDL. She points out that she drank only once and that she was not drinking regularly, even though she was intoxicated upon admission. She also states her cocaine use was " a one time thing" and denies any pattern of drug abuse .She has been refusing Librium , which was prescribed for alcohol WDL.  Her vitals are stable, she is not tremulous, she does not present to be in any acute distress or discomfort. She has been visible in milieu and going to some groups. No disruptive behaviors on unit.   Diagnosis:  Bipolar Disorder Depressed   Total Time spent with patient: 25 minutes   ADL's: fair   Sleep: fair  Appetite: fair, but improving slightly- patient states she has lost significant weight over recent months  Suicidal Ideation:  Currently denies SI  Homicidal Ideation:  Denies  AEB (as evidenced by):  Psychiatric Specialty Exam: Physical Exam  ROS  Blood pressure 135/74, pulse 64, temperature 98.2 F (36.8 C), temperature source Oral, resp. rate 64, height 5' 6.14" (1.68 m), weight 60.782 kg (134 lb), SpO2 96 %.Body mass index is 21.54 kg/(m^2).  General Appearance: Fairly Groomed  Engineer, water::  Good  Speech:  Normal Rate  Volume:  Normal  Mood:  Depressed and and slightly irritable, but seems better than upon admission   Affect:  slightly irritable, constricted  Thought Process:  Goal Directed and Linear  Orientation:  Other:  fully alert and attentive  Thought Content:  denies hallucinations, no delusions  Suicidal Thoughts:  No at this time denies any thoughts of hurting self and  contracts for safety on unit   Homicidal Thoughts:  No  Memory:  Recent and Remote grossly intact  Judgement:  Fair  Insight:  Fair  Psychomotor Activity:  Normal  Concentration:  Good  Recall:  Good  Fund of Knowledge:Good  Language: Good  Akathisia:  Negative  Handed:  Right  AIMS (if indicated):     Assets:  Desire for Improvement Resilience  Sleep:  Number of Hours: 5.5   Musculoskeletal: Strength & Muscle Tone: within normal limits Gait & Station: normal Patient leans: N/A  Current Medications: Current Facility-Administered Medications  Medication Dose Route Frequency Provider Last Rate Last Dose  . acetaminophen (TYLENOL) tablet 650 mg  650 mg Oral Q6H PRN Laverle Hobby, PA-C   650 mg at 01/02/14 1100  . albuterol (PROVENTIL HFA;VENTOLIN HFA) 108 (90 BASE) MCG/ACT inhaler 2 puff  2 puff Inhalation Q6H PRN Laverle Hobby, PA-C      . alum & mag hydroxide-simeth (MAALOX/MYLANTA) 200-200-20 MG/5ML suspension 30 mL  30 mL Oral Q4H PRN Laverle Hobby, PA-C      . carbamazepine (TEGRETOL) tablet 200 mg  200 mg Oral BID Jenne Campus, MD   200 mg at 01/02/14 1702  .  chlordiazePOXIDE (LIBRIUM) capsule 25 mg  25 mg Oral TID Laverle Hobby, PA-C   25 mg at 01/02/14 1200   Followed by  . [START ON 01/03/2014] chlordiazePOXIDE (LIBRIUM) capsule 25 mg  25 mg Oral BH-qamhs Spencer E Simon, PA-C       Followed by  . [START ON 01/05/2014] chlordiazePOXIDE (LIBRIUM) capsule 25 mg  25 mg Oral Daily Laverle Hobby, PA-C      . famotidine (PEPCID) tablet 20 mg  20 mg Oral BID Laverle Hobby, PA-C   20 mg at 01/02/14 1700  . feeding supplement (ENSURE COMPLETE) (ENSURE COMPLETE) liquid 237 mL  237 mL Oral TID BM  Clayton Bibles, RD   237 mL at 01/02/14 1418  . hydrOXYzine (ATARAX/VISTARIL) tablet 25 mg  25 mg Oral Q6H PRN Laverle Hobby, PA-C      . ibuprofen (ADVIL,MOTRIN) tablet 600 mg  600 mg Oral Q6H PRN Janett Labella, NP   600 mg at 01/02/14 1700  . ketorolac (TORADOL) tablet 10 mg  10 mg Oral Q6H PRN Janett Labella, NP   10 mg at 01/02/14 1418  . loperamide (IMODIUM) capsule 2-4 mg  2-4 mg Oral PRN Laverle Hobby, PA-C      . LORazepam (ATIVAN) tablet 1 mg  1 mg Oral Q8H PRN Myer Peer Cobos, MD      . magnesium hydroxide (MILK OF MAGNESIA) suspension 30 mL  30 mL Oral Daily PRN Laverle Hobby, PA-C   30 mL at 01/02/14 1211  . meloxicam (MOBIC) tablet 7.5 mg  7.5 mg Oral Daily Laverle Hobby, PA-C   7.5 mg at 01/02/14 0818  . methocarbamol (ROBAXIN) tablet 500 mg  500 mg Oral Q8H PRN Laverle Hobby, PA-C   500 mg at 01/02/14 1542  . multivitamin with minerals tablet 1 tablet  1 tablet Oral Daily Laverle Hobby, PA-C   1 tablet at 01/01/14 0815  . nicotine (NICODERM CQ - dosed in mg/24 hours) patch 21 mg  21 mg Transdermal Daily Jenne Campus, MD   21 mg at 01/02/14 0819  . ondansetron (ZOFRAN-ODT) disintegrating tablet 4 mg  4 mg Oral Q6H PRN Laverle Hobby, PA-C   4 mg at 01/02/14 7902  . PARoxetine (PAXIL) tablet 40 mg  40 mg Oral Daily Laverle Hobby, PA-C   40 mg at 01/02/14 0817  . QUEtiapine (SEROQUEL) tablet 400 mg  400 mg Oral QHS Fernando A Cobos, MD      . thiamine (B-1) injection 100 mg  100 mg Intramuscular Once Laverle Hobby, PA-C   100 mg at 12/31/13 2359  . thiamine (VITAMIN B-1) tablet 100 mg  100 mg Oral Daily Laverle Hobby, PA-C   100 mg at 01/02/14 4097    Lab Results: No results found for this or any previous visit (from the past 63 hour(s)).  Physical Findings: AIMS: Facial and Oral Movements Muscles of Facial Expression: None, normal Lips and Perioral Area: None, normal Jaw: None, normal Tongue: None, normal,Extremity Movements Upper (arms, wrists,  hands, fingers): None, normal Lower (legs, knees, ankles, toes): None, normal, Trunk Movements Neck, shoulders, hips: None, normal, Overall Severity Severity of abnormal movements (highest score from questions above): None, normal Incapacitation due to abnormal movements: None, normal Patient's awareness of abnormal movements (rate only patient's report): No Awareness,    CIWA:  CIWA-Ar Total: 2 COWS:     Assessment: Patient partially improved compared to admission- still somewhat depressed  and irriable. Denies any pattern of alcohol dependence and has been refusing Librium detox. Reports a history of Bipolar Spectrum Disorder, and states that Tegretol has been quite helpful, in addition to Seroquel, which she states she had been taking at 600 mgrs QHS. We reviewed medication side effects.  Treatment Plan Summary: Daily contact with patient to assess and evaluate symptoms and progress in treatment  Plan: Continue inpatient treatment and support  Start Carbamazepine at 200 mgrs BID Increase Seroquel to 400 mgrs QHS Continue Paxil 40 mgrs QDAY Ativan 1 mgr Q 8 hours PRN Anxiety- consider tapering off prior to discharge. D/C Librium detox protocol  Medical Decision Making Problem Points:  Established problem, stable/improving (1), Review of last therapy session (1) and Review of psycho-social stressors (1) Data Points:  Review or order clinical lab tests (1) Review of medication regiment & side effects (2) Review of new medications or change in dosage (2)  I certify that inpatient services furnished can reasonably be expected to improve the patient's condition.   COBOS, FERNANDO 01/02/2014, 5:55 PM

## 2014-01-02 NOTE — Progress Notes (Signed)
NUTRITION ASSESSMENT  Pt identified as at risk on the Malnutrition Screen Tool  INTERVENTION: 1. Educated patient on the importance of nutrition and encouraged intake of food and beverages. 2. Discussed weight goals. 3. Supplements: Ensure Complete po TID, each supplement provides 350 kcal and 13 grams of protein   NUTRITION DIAGNOSIS: Unintentional weight loss related to sub-optimal intake as evidenced by pt report.   Goal: Pt to meet >/= 90% of their estimated nutrition needs.  Monitor:  PO intake  Assessment:  48 y.o. female patient admitted with suicidal ideation, depression and anxiety.  Pt very agitated during visit. Pt repeatedly spoke about medications and needing a specific bipolar medication.  Pt reports 45 lb weight loss, UBW of 165 lb. Per weight history documentation, pt was 150 lb in February (11% wt loss x 9 months). Pt states not having an appetite PTA and currently she is not eating well. Pt does not eat well when feeling depressed.  Pt is refusing to take "detox pills" and does not want multi-vitamin because she states she is allergic to vitamins.   Discussed with pt the importance of eating 3 meals a day with snacks, emphasizing protein consumption. Discussed the importance of good nutrition for mental health and aiding in depression and anxiety.  Pt is interested in receiving Ensure supplements. RD to order TID.     Height: Ht Readings from Last 1 Encounters:  12/31/13 5' 6.14" (1.68 m)    Weight: Wt Readings from Last 1 Encounters:  12/31/13 134 lb (60.782 kg)    Weight Hx: Wt Readings from Last 10 Encounters:  12/31/13 134 lb (60.782 kg)  04/22/13 148 lb 4 oz (67.246 kg)  04/06/13 150 lb (68.04 kg)  01/07/12 162 lb (73.483 kg)  11/06/11 157 lb (71.215 kg)  10/23/11 154 lb (69.854 kg)  09/03/11 158 lb (71.668 kg)    BMI:  Body mass index is 21.54 kg/(m^2). Pt meets criteria for normal range based on current BMI.  Estimated Nutritional  Needs: Kcal: 25-30 kcal/kg Protein: > 1 gram protein/kg Fluid: 1 ml/kcal  Diet Order: Diet regular Pt is also offered choice of unit snacks mid-morning and mid-afternoon.  Pt is eating as desired.   Lab results and medications reviewed.   Tilda FrancoLindsey Ajahni Nay, MS, RD, LDN Pager: [phone] After Hours Pager: [phone]

## 2014-01-02 NOTE — Progress Notes (Signed)
Patient ID: Kristie GambleJuanita F [name], female   DOB: July 21, 1965, 48 y.o.   MRN: [id number]   D: Pt informed the writer that [facility]. Stated that she is extremely depressed and started on paxil this morning. Stated she "needs something for her bipolar". Pt stated she was started on tegretol while at the ED but [facility]. Pt also states that on the "outside" she uses ativan. Writer informed the pt that for her anxiety she would be receiving librium.   A: Pt refused her librium, but accept other meds. Support and encouragement was offered. 15 min checks continued for safety.   R: Pt remains safe.

## 2014-01-02 NOTE — [facility] Group Notes (Signed)
[facility]   01/02/2014 11:10 AM    Participation Quality:  Appropraite  Mood/Affect:  Appropriate  Depression Rating:  10  Anxiety Rating:  10  Thoughts of Suicide:  No  Will you contract for safety?   NA  Current AVH:  No  Plan for Discharge/Comments:  Patient attended discharge planning group and actively participated in group. She reports not doing well today.  Patient has a home and outpatient follow up with [facility]aden [facility].  She will need a referral for counseling.  Suicide prevention education reviewed and SPE document provided.   Transportation Means: Patient uses public transportation.   Supports:  Patient has a limited support system.   [name], [name]

## 2014-01-02 NOTE — [facility] Group Notes (Signed)
[facility] LCSW Group Therapy  Emotional Regulation 1:15 - 2: 30 PM        01/02/2014     Type of Therapy:  Group Therapy  Participation Level:  Appropriate  Participation Quality:  Appropriate  Affect:  Appropriate  Cognitive:  Attentive Appropriate  Insight:  Developing/Improving Engaged  Engagement in Therapy:  Developing/Improving Engaged  Modes of Intervention:  Discussion Exploration Problem-Solving Supportive  Summary of Progress/Problems:  Group topic was emotional regulations.  Patient participated in the discussion and was able to identify an emotion that needed to regulated.  Patient stated the problems she is dealing with is pushing family members away.  She talked her daughter have never abused drugs or had serious problems with depression but have seen both in her.  She stated they try to tell her what to do.  Patient was able to see how her children, who are adult, could be hurt and fear for her safety as a result of her negative behaviors.  Wynn BankerHodnett, Conor Filsaime Hairston 01/02/2014

## 2014-01-02 NOTE — [facility] Suicide Risk Assessment (Signed)
[facility] INPATIENT:  Family/Significant Other Suicide Prevention Education  Suicide Prevention Education:  Contact Attempts: Loma NewtonJulia Freeman, Daughter, [phone]; has been identified by the patient as the family member/significant other with whom the patient will be residing, and identified as the person(s) who will aid the patient in the event of a mental health crisis.  With written consent from the patient, two attempts were made to provide suicide prevention education, prior to and/or following the patient's discharge.  We were unsuccessful in providing suicide prevention education.  A suicide education pamphlet was given to the patient to share with family/significant other.  Date and time of first attempt:  Tuesday, January 01, 2014 at 3:30 PM - Message left on voicemail for daughter to call CSW Date and time of second attempt: Wednesday, January 01, 2014 at 8:45 AM - Message left on voicemail for daughter to call CSW  Wynn BankerHodnett, Kiley Torrence Hairston 01/02/2014, 8:44 AM

## 2014-01-02 NOTE — Progress Notes (Signed)
D: Patient presents with anxious affect and mood. She reported on the self inventory sheet that her sleep and ability to concentrate are both poor, energy level is hyper and appetite is fair. Patient rates depression/feelings of hopelessness/anxiety "10". She's attending the group sessions throughout the day and interactive with peers in the dayroom. Writer has observed that the patient can be very demanding and rude towards staff. Patient is compliant with most medications.  A: Support and encouragement provided to patient. Scheduled medications administered per MD orders. Maintain Q15 minute checks for safety.   R: Patient receptive. Denies SI/HI and AVH. Patient remains safe on the hall.

## 2014-01-03 MED ORDER — MELOXICAM 7.5 MG PO TABS
7.5000 mg | ORAL_TABLET | Freq: Once | ORAL | Status: AC
  Administered 2014-01-03: 7.5 mg via ORAL
  Filled 2014-01-03: qty 1

## 2014-01-03 MED ORDER — MAGNESIUM CITRATE PO SOLN
1.0000 | Freq: Once | ORAL | Status: AC
  Administered 2014-01-03: 1 via ORAL

## 2014-01-03 MED ORDER — MELOXICAM 7.5 MG PO TABS
15.0000 mg | ORAL_TABLET | Freq: Every day | ORAL | Status: DC
  Filled 2014-01-03: qty 1
  Filled 2014-01-03: qty 2

## 2014-01-03 NOTE — Progress Notes (Signed)
Patient ID: Kristie GambleJuanita F [name], female   DOB: 05-25-1965, 48 y.o.   MRN: [id number] Ms. [name] has been calm and cooperative today. She denies SI and a/v disturbances, but admits complaints about pain and anxiety, for which she has received many PRN medications. (Please see MAR.) She also said she has not had a bowel movement in more than a week -- milk of mag given in the a.m., and magnesium citrate given in p.m., with no results yet. Will continue to monitor for needs and safety.

## 2014-01-03 NOTE — [facility] Group Notes (Signed)
[facility] LCSW Group Therapy [facility] 1:15 - 2: 30          01/03/2014 3:20 PM    Type of Therapy:  Group Therapy  Participation Level:  Did not attend - with MD and RN  Juline PatchHodnett, Cherryl Babin Hairston 01/03/2014  3:20 PM

## 2014-01-03 NOTE — Progress Notes (Signed)
Recreation Therapy Notes  Animal-Assisted Activity/Therapy (AAA/T) Program Checklist/Progress Notes Patient Eligibility Criteria Checklist & Daily Group note for Rec Tx Intervention  Date: 11.19.2015 Time: 2:45pm Location: [address] Dayroom    AAA/T Program Assumption of Risk Form signed by Patient/ or Parent Legal Guardian yes  Patient is free of allergies or sever asthma yes  Patient reports no fear of animals yes  Patient reports no history of cruelty to animals yes   Patient understands his/her participation is voluntary yes  Patient washes hands before animal contact yes  Patient washes hands after animal contact yes  Behavioral Response: Appropriate   Education: Hand Washing, Appropriate Animal Interaction   Education Outcome: Acknowledges education.   Clinical Observations/Feedback: Patient arrived to session at approximately 3:10pm, upon arrival patient engaged in session, petting therapy dog appropriately.   Marykay Lexenise L Navon Kotowski, LRT/CTRS  Ashli Selders L 01/03/2014 4:20 PM

## 2014-01-03 NOTE — Progress Notes (Addendum)
Patient ID: [name], female   DOB: 11-May-1965, 48 y.o.   MRN: [id number] [facility] MD Progress Note  01/03/2014 3:56 PM [name]  MRN:  [id number] Subjective: Patient states that she feels better. She states that Tegretol has improved , and that her mood is " more stable".  She states it is hard for her to concentrate. She states she continues to have some lower extremity pain.   Objective: I have discussed case with treatment team and have met with patient. As per staff, patient has continued to feel anxious and to be somewhat irritable, but is better than upon admission.  She has been visible in milieu and going to some groups. No disruptive behaviors on unit. Of note, she states she is thinking of [facility] rather than returning home. At this time is not endorsing medication side effects. TSH WNL.  Diagnosis:  Bipolar Disorder Depressed   Total Time spent with patient: 25 minutes   ADL's: fair   Sleep: improved   Appetite: fair   Suicidal Ideation:  Currently denies SI  Homicidal Ideation:  Denies  AEB (as evidenced by):  Psychiatric Specialty Exam: Physical Exam  ROS  Blood pressure 131/73, pulse 69, temperature 98.5 F (36.9 C), temperature source Oral, resp. rate 18, height 5' 6.14" (1.68 m), weight 60.782 kg (134 lb), SpO2 96 %.Body mass index is 21.54 kg/(m^2).  General Appearance: improved grooming   Eye Contact::  Good  Speech:  Normal Rate  Volume:  Normal  Mood:  less depressed, less irritable  Affect:  Appropriate  Thought Process:  Goal Directed and Linear  Orientation:  Other:  fully alert and attentive  Thought Content:  denies hallucinations, no delusions  Suicidal Thoughts:  No at this time denies any thoughts of hurting self and  contracts for safety on unit   Homicidal Thoughts:  No  Memory:  Recent and Remote grossly intact  Judgement:  Fair  Insight:  Fair  Psychomotor Activity:  Normal  Concentration:  Good  Recall:   Good  Fund of Knowledge:Good  Language: Good  Akathisia:  Negative  Handed:  Right  AIMS (if indicated):     Assets:  Desire for Improvement Resilience  Sleep:  Number of Hours: 5.5   Musculoskeletal: Strength & Muscle Tone: within normal limits Gait & Station: normal Patient leans: N/A  Current Medications: Current Facility-Administered Medications  Medication Dose Route Frequency Provider Last Rate Last Dose  . acetaminophen (TYLENOL) tablet 650 mg  650 mg Oral Q6H PRN Laverle Hobby, PA-C   650 mg at 01/03/14 1133  . albuterol (PROVENTIL HFA;VENTOLIN HFA) 108 (90 BASE) MCG/ACT inhaler 2 puff  2 puff Inhalation Q6H PRN Laverle Hobby, PA-C      . alum & mag hydroxide-simeth (MAALOX/MYLANTA) 200-200-20 MG/5ML suspension 30 mL  30 mL Oral Q4H PRN Laverle Hobby, PA-C      . carbamazepine (TEGRETOL) tablet 200 mg  200 mg Oral BID Myer Peer Cobos, MD   200 mg at 01/03/14 0800  . chlordiazePOXIDE (LIBRIUM) capsule 25 mg  25 mg Oral BH-qamhs Laverle Hobby, PA-C       Followed by  . [START ON 01/05/2014] chlordiazePOXIDE (LIBRIUM) capsule 25 mg  25 mg Oral Daily Laverle Hobby, PA-C      . famotidine (PEPCID) tablet 20 mg  20 mg Oral BID Laverle Hobby, PA-C   20 mg at 01/02/14 1700  . feeding supplement (ENSURE COMPLETE) (  ENSURE COMPLETE) liquid 237 mL  237 mL Oral TID BM Clayton Bibles, RD   237 mL at 01/03/14 1453  . hydrOXYzine (ATARAX/VISTARIL) tablet 25 mg  25 mg Oral Q6H PRN Laverle Hobby, PA-C      . ibuprofen (ADVIL,MOTRIN) tablet 600 mg  600 mg Oral Q6H PRN Janett Labella, NP   600 mg at 01/03/14 0808  . ketorolac (TORADOL) tablet 10 mg  10 mg Oral Q6H PRN Janett Labella, NP   10 mg at 01/03/14 1327  . loperamide (IMODIUM) capsule 2-4 mg  2-4 mg Oral PRN Laverle Hobby, PA-C      . LORazepam (ATIVAN) tablet 1 mg  1 mg Oral Q8H PRN Jenne Campus, MD   1 mg at 01/03/14 1453  . magnesium hydroxide (MILK OF MAGNESIA) suspension 30 mL  30 mL Oral Daily PRN Laverle Hobby, PA-C   30 mL at 01/03/14 0917  . [START ON 01/04/2014] meloxicam (MOBIC) tablet 15 mg  15 mg Oral Daily Sheila May Agustin, NP      . meloxicam [facility]) tablet 7.5 mg  7.5 mg Oral Once Sheila May Agustin, NP   Stopped at 01/03/14 1315  . methocarbamol (ROBAXIN) tablet 500 mg  500 mg Oral Q8H PRN Laverle Hobby, PA-C   500 mg at 01/03/14 1453  . multivitamin with minerals tablet 1 tablet  1 tablet Oral Daily Laverle Hobby, PA-C   1 tablet at 01/01/14 0815  . nicotine (NICODERM CQ - dosed in mg/24 hours) patch 21 mg  21 mg Transdermal Daily Jenne Campus, MD   21 mg at 01/03/14 0801  . ondansetron (ZOFRAN-ODT) disintegrating tablet 4 mg  4 mg Oral Q6H PRN Laverle Hobby, PA-C   4 mg at 01/03/14 1327  . PARoxetine (PAXIL) tablet 40 mg  40 mg Oral Daily Laverle Hobby, PA-C   40 mg at 01/03/14 0758  . QUEtiapine (SEROQUEL) tablet 400 mg  400 mg Oral QHS Jenne Campus, MD   400 mg at 01/02/14 2140  . thiamine (B-1) injection 100 mg  100 mg Intramuscular Once Laverle Hobby, PA-C   100 mg at 12/31/13 2359  . thiamine (VITAMIN B-1) tablet 100 mg  100 mg Oral Daily Laverle Hobby, PA-C   100 mg at 01/03/14 0800    Lab Results:  Results for orders placed or performed during the hospital encounter of 12/31/13 (from the past 48 hour(s))  TSH     Status: None   Collection Time: 01/02/14  7:33 PM  Result Value Ref Range   TSH 1.330 0.350 - 4.500 uIU/mL    Comment: Performed at [facility]    Physical Findings: AIMS: Facial and Oral Movements Muscles of Facial Expression: None, normal Lips and Perioral Area: None, normal Jaw: None, normal Tongue: None, normal,Extremity Movements Upper (arms, wrists, hands, fingers): None, normal Lower (legs, knees, ankles, toes): None, normal, Trunk Movements Neck, shoulders, hips: None, normal, Overall Severity Severity of abnormal movements (highest score from questions above): None, normal Incapacitation due to abnormal movements:  None, normal Patient's awareness of abnormal movements (rate only patient's report): No Awareness, Dental Status Current problems with teeth and/or dentures?: No Does patient usually wear dentures?: No  CIWA:  CIWA-Ar Total: 2 COWS:     Assessment: Patient states she is better and feels less labile and reports more stable mood. She presents with improvement, and presents calmer and better related. She has been thinking  of going into a [facility] setting rather than returning home, as she states the" atmosphere there was negative". She is tolerating medications well, but today states she feels subjectively       " foggy" ( although she is fully alert and attentive, and is oriented x 3) .  We discussed , and she prefers to continue current medication dosages for now, as she feels they are working well.  Treatment Plan Summary: Daily contact with patient to assess and evaluate symptoms and progress in treatment  Plan: Continue inpatient treatment and support  Carbamazepine 200 mgrs BID Seroquel  400 mgrs QHS Paxil 40 mgrs QDAY Ativan 1 mgr Q 8 hours PRN Anxiety   Medical Decision Making Problem Points:  Established problem, stable/improving (1), Review of last therapy session (1) and Review of psycho-social stressors (1) Data Points:  Review or order clinical lab tests (1) Review of medication regiment & side effects (2)  I certify that inpatient services furnished can reasonably be expected to improve the patient's condition.   COBOS, FERNANDO 01/03/2014, 3:56 PM

## 2014-01-03 NOTE — Progress Notes (Signed)
Pt attended NA group this evening.  

## 2014-01-03 NOTE — Progress Notes (Signed)
D: Pt presents anxious in affect and mood. Pt reports having anxiety and nausea this evening. Pt's symptoms managed with Ativan and Zofran. Pt actively participated within the milieu this evening. Pt is currently denying any SI/HI/AVH. Pt is asking for clarification of her scheduled medication times for her " Bipolar medication". Writer informed pt that her mood stabilizer Tegretol for her Bipolar D/O is scheduled BID for 0800 and 1700. Pt made aware that her she will receive both doses starting Thursday. Pt receptive to this information.  A: Writer administered scheduled and prn medications to pt. Continued support and availability as needed was extended to this pt. Staff continue to monitor pt with q3515min checks.  R: No adverse drug reactions noted. Pt receptive to treatment. Pt remains safe at this time.

## 2014-01-03 NOTE — Progress Notes (Signed)
D: Pt reports that she is doing "better" today. Pt is visible and active within the milieu. Pt reports having some ongoing lower back pain at level of 9 out of 10. Pt requested to have Toradol and acetaminophen together. Pt encouraged to give the Toradol time to work before receiving any additional pain medications. Pt is noted to be calm and cooperative with her current plan of care. Pt is currently negative for any SI/HI/AVH.  A: Writer administered scheduled and prn medications to pt, per MD orders. Continued support and availability as needed was extended to this pt. Staff continue to monitor pt with q2215min checks.  R: No adverse drug reactions noted. Pt receptive to treatment. Pt remains safe at this time.

## 2014-01-04 MED ORDER — PAROXETINE HCL 20 MG PO TABS
40.0000 mg | ORAL_TABLET | Freq: Every day | ORAL | Status: DC
  Filled 2014-01-04 (×2): qty 28

## 2014-01-04 MED ORDER — CARBAMAZEPINE 200 MG PO TABS
200.0000 mg | ORAL_TABLET | Freq: Two times a day (BID) | ORAL | Status: DC
  Filled 2014-01-04 (×2): qty 28

## 2014-01-04 MED ORDER — QUETIAPINE FUMARATE 400 MG PO TABS
400.0000 mg | ORAL_TABLET | Freq: Every day | ORAL | Status: DC
  Filled 2014-01-04: qty 14

## 2014-01-04 MED ORDER — METHOCARBAMOL 750 MG PO TABS
750.0000 mg | ORAL_TABLET | Freq: Three times a day (TID) | ORAL | Status: DC | PRN
  Administered 2014-01-04 – 2014-01-05 (×4): 750 mg via ORAL
  Filled 2014-01-04 (×4): qty 1

## 2014-01-04 MED ORDER — FAMOTIDINE 20 MG PO TABS
20.0000 mg | ORAL_TABLET | Freq: Two times a day (BID) | ORAL | Status: DC
  Filled 2014-01-04 (×2): qty 28

## 2014-01-04 NOTE — Plan of Care (Signed)
Problem: Ineffective individual coping Goal: STG: Patient will participate in after care plan Patient will attend attend groups and engage in discussion. Outpatient follow up appointment will be scheduled.  Burnis Medin Lujain Kraszewski, LCSW 01/04/2014 11:43 AM  Outcome: Completed/Met Date Met:  01/04/14

## 2014-01-04 NOTE — Progress Notes (Signed)
Patient ID: [name], female   DOB: September 05, 1965, 48 y.o.   MRN: [id number] [facility] MD Progress Note  01/04/2014 1:58 PM [name]  MRN:  [id number] Subjective:Patient reports improvement and is hoping to discharge soon. She does continue to report some chronic pain, at this time partially alleviated by toradol.  Objective: I have discussed case with treatment team and have met with patient. Patient presents improved, with an improved mood and range of affect. She states that at this time she is wanting to discharge soon. She remains focused on pain issues, and states toradol helps , but only partially. She wanted to know reason to avoid using toradol with other NSAIDS on a regular basis. Was receptive to rationale. Of note, she denies any medication side effects, and states she has had no epigastric discomfort or GERD type symptoms, and denies any melenas.  On unit she has been calm, interactive with peers, and behavior has remained in good control. As per nursing staff she has continued focus on medication issues.  Diagnosis:  Bipolar Disorder Depressed   Total Time spent with patient: 25 minutes   ADL's: improved   Sleep: improved   Appetite: improved   Suicidal Ideation:  Currently denies SI  Homicidal Ideation:  Denies  AEB (as evidenced by):  Psychiatric Specialty Exam: Physical Exam  ROS  Blood pressure 108/81, pulse 76, temperature 97 F (36.1 C), temperature source Oral, resp. rate 16, height 5' 6.14" (1.68 m), weight 60.782 kg (134 lb), SpO2 96 %.Body mass index is 21.54 kg/(m^2).  General Appearance: improved grooming   Eye Contact::  Good  Speech:  Normal Rate  Volume:  Normal  Mood:  at this time mood significantly improved, and at this time seems euthymic with a more reactive affect  Affect:  Appropriate, brighter, still somewhat anxious  Thought Process:  Goal Directed and Linear  Orientation:  Other:  fully alert and attentive  Thought Content:   denies hallucinations, no delusions  Suicidal Thoughts:  No at this time denies any thoughts of hurting self and  contracts for safety on unit   Homicidal Thoughts:  No  Memory:  Recent and Remote grossly intact  Judgement:  Fair  Insight:  Fair  Psychomotor Activity:  Normal  Concentration:  Good  Recall:  Good  Fund of Knowledge:Good  Language: Good  Akathisia:  Negative  Handed:  Right  AIMS (if indicated):     Assets:  Desire for Improvement Resilience  Sleep:  Number of Hours: 6.75   Musculoskeletal: Strength & Muscle Tone: within normal limits Gait & Station: normal Patient leans: N/A  Current Medications: Current Facility-Administered Medications  Medication Dose Route Frequency Provider Last Rate Last Dose  . acetaminophen (TYLENOL) tablet 650 mg  650 mg Oral Q6H PRN Laverle Hobby, PA-C   650 mg at 01/04/14 0908  . albuterol (PROVENTIL HFA;VENTOLIN HFA) 108 (90 BASE) MCG/ACT inhaler 2 puff  2 puff Inhalation Q6H PRN Laverle Hobby, PA-C      . alum & mag hydroxide-simeth (MAALOX/MYLANTA) 200-200-20 MG/5ML suspension 30 mL  30 mL Oral Q4H PRN Laverle Hobby, PA-C      . carbamazepine (TEGRETOL) tablet 200 mg  200 mg Oral BID Jenne Campus, MD   200 mg at 01/04/14 0901  . chlordiazePOXIDE (LIBRIUM) capsule 25 mg  25 mg Oral BH-qamhs Laverle Hobby, PA-C   25 mg at 01/03/14 2144   Followed by  . [START ON 01/05/2014] chlordiazePOXIDE (LIBRIUM) capsule  25 mg  25 mg Oral Daily Laverle Hobby, PA-C      . famotidine (PEPCID) tablet 20 mg  20 mg Oral BID Laverle Hobby, PA-C   20 mg at 01/04/14 0901  . feeding supplement (ENSURE COMPLETE) (ENSURE COMPLETE) liquid 237 mL  237 mL Oral TID BM Clayton Bibles, RD   237 mL at 01/04/14 1100  . ketorolac (TORADOL) tablet 10 mg  10 mg Oral Q6H PRN Janett Labella, NP   10 mg at 01/04/14 1323  . LORazepam (ATIVAN) tablet 1 mg  1 mg Oral Q8H PRN Jenne Campus, MD   1 mg at 01/04/14 0556  . magnesium hydroxide (MILK OF  MAGNESIA) suspension 30 mL  30 mL Oral Daily PRN Laverle Hobby, PA-C   30 mL at 01/03/14 0917  . methocarbamol (ROBAXIN) tablet 750 mg  750 mg Oral Q8H PRN Janett Labella, NP      . multivitamin with minerals tablet 1 tablet  1 tablet Oral Daily Laverle Hobby, PA-C   1 tablet at 01/01/14 0815  . nicotine (NICODERM CQ - dosed in mg/24 hours) patch 21 mg  21 mg Transdermal Daily Jenne Campus, MD   21 mg at 01/04/14 0902  . PARoxetine (PAXIL) tablet 40 mg  40 mg Oral Daily Laverle Hobby, PA-C   40 mg at 01/04/14 0901  . QUEtiapine (SEROQUEL) tablet 400 mg  400 mg Oral QHS Jenne Campus, MD   400 mg at 01/03/14 2144  . thiamine (B-1) injection 100 mg  100 mg Intramuscular Once Laverle Hobby, PA-C   100 mg at 12/31/13 2359  . thiamine (VITAMIN B-1) tablet 100 mg  100 mg Oral Daily Laverle Hobby, PA-C   100 mg at 01/04/14 7062    Lab Results:  Results for orders placed or performed during the hospital encounter of 12/31/13 (from the past 48 hour(s))  TSH     Status: None   Collection Time: 01/02/14  7:33 PM  Result Value Ref Range   TSH 1.330 0.350 - 4.500 uIU/mL    Comment: Performed at [facility]    Physical Findings: AIMS: Facial and Oral Movements Muscles of Facial Expression: None, normal Lips and Perioral Area: None, normal Jaw: None, normal Tongue: None, normal,Extremity Movements Upper (arms, wrists, hands, fingers): None, normal Lower (legs, knees, ankles, toes): None, normal, Trunk Movements Neck, shoulders, hips: None, normal, Overall Severity Severity of abnormal movements (highest score from questions above): None, normal Incapacitation due to abnormal movements: None, normal Patient's awareness of abnormal movements (rate only patient's report): No Awareness, Dental Status Current problems with teeth and/or dentures?: No Does patient usually wear dentures?: No  CIWA:  CIWA-Ar Total: 3 COWS:     Assessment: Patient is improving and mood /affect  seem improved. Anxiety has decreased. She complains of some ongoing pain, but appears calm, and in no acute distress or discomfort at present. Toradol PRNs have been effective, and thus far she has had no side effects.  At this time she is hoping for discharge soon.    Treatment Plan Summary: Daily contact with patient to assess and evaluate symptoms and progress in treatment  Plan: Continue inpatient treatment and support  Carbamazepine 200 mgrs BID Seroquel  400 mgrs QHS Paxil 40 mgrs QDAY Ativan 1 mgr Q 8 hours PRN Anxiety *Consider Discharge Saturday if she continues to stabilize Dispo plan is for patient to follow up with  Dr. Rosine Door ,  at [facility] in [place], on 11/24.     Medical Decision Making Problem Points:  Established problem, stable/improving (1), Review of last therapy session (1) and Review of psycho-social stressors (1) Data Points:  Review or order clinical lab tests (1) Review of medication regiment & side effects (2)  I certify that inpatient services furnished can reasonably be expected to improve the patient's condition.   Sumayah Bearse, [place] 01/04/2014, 1:58 PM

## 2014-01-04 NOTE — Progress Notes (Signed)
D: Patient continues to have anxious affect and mood. She reported on the self inventory sheet that she's sleeping good and poor appetite and ability to concentrate. Patient is rating depression "5", feelings of hopelessness "8" and anxiety "0". She voiced that she's very happy because out of three goals she's been wanting to accomplish before discharge she just accomplished two today. Patient is participating in groups and visible in the milieu. In adherence with medication regimen.  A: Support and encouragement provided to patient. Administered scheduled medications per ordering MD. Monitor Q15 minute checks for safety.  R: Patient receptive. Denies SI/HI/AVH. Patient remains safe on the unit.

## 2014-01-04 NOTE — [facility] Group Notes (Signed)
[facility] LCSW Group Therapy  Feelings Around Relapse 1:15 -2:30        01/04/2014 3:07 PM   Type of Therapy:  Group Therapy  Participation Level:  Appropriate  Participation Quality:  Appropriate  Affect:  Appropriate  Cognitive:  Attentive Appropriate  Insight:  Developing/Improving  Engagement in Therapy: Developing/Improving  Modes of Intervention:  Discussion Exploration Problem-Solving Supportive  Summary of Progress/Problems:  The topic for today was feelings around relapse.  Patient processed feelings toward relapse and was able to relate to peers. She advised would be drinking again.  Patient stated she can not risk a relapse as her daughter will not have anything to do with her.  Patient stated she has worked hard to rebuild the relationship. Patient identified coping skills that can be used to prevent a relapse including possible going into a residential program later on.   Wynn BankerHodnett, [name] 01/04/2014 3:07 PM

## 2014-01-04 NOTE — Progress Notes (Signed)
[facility]charge [facility] :  Will you be returning to the same living situation after discharge: Yes,  Patient is returning to her home. At discharge, do you have transportation home?:Yes,  Patient will arrange for family to pick her up for discharge. Do you have the ability to pay for your medications:Yes,  Patient has Medicare/Medicaid.  Release of information consent forms completed and in the chart;  Patient's signature needed at discharge.  Patient to Follow up at: Follow-up Information    Follow up with Dr. Omelia BlackwaterHeaden - [facility] On 01/08/2014.   Why:  You are scheduled with Dr. Omelia BlackwaterHeaden on Tuesday, January 08, 2014 at 11:45   Contact information:   [address] [place], [place]   [address]  [phone]      [facility] with [facility].   Why:  You will be called at home with an appointment to [facility].   Contact information:   [address] [place], [place]   [address]  [phone]      Please follow up.   Why:  Please call [facility] at [phone] or [facility] at [phone] if you decide you want to follow up on residential treatment.   Contact information:   No consent need as no contact made.      Patient denies SI/HI:     .Patient no longer endorsing SI/HI or other thoughts of self harm.   Safety Planning and Suicide Prevention discussed: .Reviewed with all patients during discharge planning group   Suzi Hernan, Joesph JulyQuylle Hairston 01/04/2014, 3:11 PM

## 2014-01-04 NOTE — Plan of Care (Signed)
Problem: Alteration in mood; excessive anxiety as evidenced by: Goal: LTG-Patient's behavior demonstrates decreased anxiety Goal not met. Patient is rating anxiety at ten. Goal is for patient to rate anxiety at three or below on discharge.   Burnis Medin Glorya Bartley, LCSW 01/01/2014  Goal met. Patient is rating anxiety at zero. Goal is for patient to rate anxiety at three or below on discharge.   Burnis Medin Janiel Crisostomo, LCSW 01/04/2014 10:30AM       (Patient's behavior demonstrates anxiety and he/she is utilizing learned coping skills to deal with anxiety-producing situations)  Outcome: Completed/Met Date Met:  01/04/14

## 2014-01-04 NOTE — [facility] Group Notes (Signed)
[facility] Group Notes: activities Date:  01/04/2014  Time:  11:00 AM  Type of Therapy:  Psychoeducational Skills  Participation Level:  Active  Participation Quality:  Appropriate  Affect:  Appropriate  Cognitive:  Appropriate  Insight:  Appropriate  Engagement in Group:  Engaged  Modes of Intervention:  Discussion  Summary of Progress/Problems:  [name], [name] 01/04/2014, 11:00 AM

## 2014-01-04 NOTE — Plan of Care (Signed)
Problem: Alteration in mood & ability to function due to Goal: STG: Patient verbalizes decreases in signs of withdrawal Goal met. Patient is not endorsing any sign/symptoms of withdrawal at this time. Patient's CIWA at two on admission. Goal is for CIWA to be at zero on discharge.  Burnis Medin Kaydence Baba, LCSW 01/04/2014 Outcome: Completed/Met Date Met:  01/04/14

## 2014-01-04 NOTE — Tx Team (Signed)
Interdisciplinary Treatment Plan Update   Date Reviewed:  01/04/2014  Time Reviewed:  8:28 AM  Progress in Treatment:   Attending groups: Yes Participating in groups: Yes Taking medication as prescribed: Yes  Tolerating medication: Yes Family/Significant other contact made:  No.  Messages left on daughter's voice mail but not return call. Patient understands diagnosis: Yes, patient understands diagnosis and need for treatment. Discussing patient identified problems/goals with staff:  Yes, patient is able to express goals for treatment and discharge. Medical problems stabilized or resolved: Yes Denies suicidal/homicidal ideation: Yes Patient has not harmed self or others: Yes  For review of initial/current patient goals, please see plan of care.  Estimated Length of Stay: 1 - 3 days  Reasons for Continued Hospitalization:  Anxiety Depression Medication stabilization   New Problems/Goals identified:     Discharge Plan or Barriers:   Home with outpatient follow up [facility]  Additional Comments:   Continue medication stabilization   Patient and CSW reviewed patient's identified goals and treatment plan.  Patient verbalized understanding and agreed to treatment plan.    Attendees:  Patient:  01/04/2014 8:28 AM   Signature:  Sallyanne HaversF. Cobos, MD 01/04/2014 8:28 AM  Signature: 01/04/2014 8:28 AM  Signature: Ellison HughsElana Payne, Pharmacist 01/04/2014 8:28 AM  Signature:  Harold Barbanonecia Byrd, RN 01/04/2014 8:28 AM  Signature:  Michaelle BirksBritney Guthrie, RN 01/04/2014 8:28 AM  Signature:  Juline PatchQuylle Koua Deeg, LCSW 01/04/2014 8:28 AM  Signature:  Belenda CruiseKristin Drinkard, LCSW-A 01/04/2014 8:28 AM  Signature:  Leisa LenzValerie Enoch, Care Coordinator [facility] 01/04/2014 8:28 AM  Signature:  01/04/2014 8:28 AM  Signature: 01/04/2014  8:28 AM  Signature:   01/04/2014  8:28 AM  Signature:   01/04/2014  8:28 AM    Scribe for Treatment Team:   Juline PatchQuylle Kensington Rios,  01/04/2014 8:28 AM

## 2014-01-04 NOTE — Plan of Care (Signed)
Problem: Alteration in mood Goal: LTG-Pt's behavior demonstrates decreased signs of depression Goal not met. Patient is rating depression at ten. Goal is for patient to rate depression at four or below prior to discharge.  Burnis Medin Berea Majkowski, LCSW 01/01/2014  Goal not met. Patient is rating depression at five. Goal is for patient to rate depression at four or below prior to discharge.  Burnis Medin Chandi Nicklin, LCSW 01/04/2014 11:46 AM      (Patient's behavior demonstrates decreased signs of depression to the point the patient is safe to return home and continue treatment in an outpatient setting)  Outcome: Completed/Met Date Met:  01/04/14

## 2014-01-04 NOTE — Progress Notes (Signed)
Patient ID: Kristie GambleJuanita F [name], female   DOB: 04/05/1965, 48 y.o.   MRN: [id number] PER STATE REGULATIONS 482.30  THIS CHART WAS REVIEWED FOR MEDICAL NECESSITY WITH RESPECT TO THE PATIENT'S ADMISSION/ DURATION OF STAY.  NEXT REVIEW DATE:01/08/2014   Willa RoughJENNIFER JONES Kristie Chandra, RN, BSN CASE MANAGER

## 2014-01-04 NOTE — [facility] Group Notes (Signed)
[facility]   01/04/2014 8:46 AM    Participation Quality:  Appropraite  Mood/Affect:  Appropriate  Depression Rating:  5  Anxiety Rating:  0  Thoughts of Suicide:  No  Will you contract for safety?   NA  Current AVH:  No  Plan for Discharge/Comments:  Patient attended discharge planning group and actively participated in group. She will return to her home and [facility]. Suicide prevention education reviewed and SPE document provided.   Transportation Means: Patient has transportation.   Supports:  Patient has a support system.   [name], Joesph JulyQuylle Hairston

## 2014-01-04 NOTE — [facility] Group Notes (Signed)
Adult Psychoeducational Group Note  Date:  01/04/2014 Time:  9:59 PM  Group Topic/Focus:  AA Meeting  Participation Level:  Did Not Attend  Participation Quality:  None  Affect:  None  Cognitive:  None  Insight: None  Engagement in Group:  None  Modes of Intervention:  Discussion and Education  Additional Comments:  [name] did not attend group.  [name], [name] A 01/04/2014, 9:59 PM

## 2014-01-05 DIAGNOSIS — F1490 Cocaine use, unspecified, uncomplicated: Secondary | ICD-10-CM

## 2014-01-05 DIAGNOSIS — F3130 Bipolar disorder, current episode depressed, mild or moderate severity, unspecified: Principal | ICD-10-CM

## 2014-01-05 DIAGNOSIS — F1099 Alcohol use, unspecified with unspecified alcohol-induced disorder: Secondary | ICD-10-CM

## 2014-01-05 DIAGNOSIS — F1290 Cannabis use, unspecified, uncomplicated: Secondary | ICD-10-CM

## 2014-01-05 DIAGNOSIS — F1220 Cannabis dependence, uncomplicated: Secondary | ICD-10-CM | POA: Insufficient documentation

## 2014-01-05 DIAGNOSIS — F1420 Cocaine dependence, uncomplicated: Secondary | ICD-10-CM | POA: Insufficient documentation

## 2014-01-05 DIAGNOSIS — F1020 Alcohol dependence, uncomplicated: Secondary | ICD-10-CM | POA: Insufficient documentation

## 2014-01-05 MED ORDER — ADULT MULTIVITAMIN W/MINERALS CH: 1.0000 | ORAL_TABLET | Freq: Every day | ORAL | Status: DC

## 2014-01-05 MED ORDER — QUETIAPINE FUMARATE 400 MG PO TABS: 400.0000 mg | ORAL_TABLET | Freq: Every day | ORAL | Status: DC

## 2014-01-05 MED ORDER — PAROXETINE HCL 40 MG PO TABS: 40.0000 mg | ORAL_TABLET | Freq: Every day | ORAL | Status: DC

## 2014-01-05 MED ORDER — MELOXICAM 7.5 MG PO TABS: 7.5000 mg | ORAL_TABLET | Freq: Every day | ORAL | Status: DC

## 2014-01-05 MED ORDER — CARBAMAZEPINE 200 MG PO TABS: 200.0000 mg | ORAL_TABLET | Freq: Two times a day (BID) | ORAL | Status: DC

## 2014-01-05 NOTE — [facility] Group Notes (Signed)
[facility] LCSW Group Therapy  01/05/2014 12:42 PM  Type of Therapy:  Group Therapy  Participation Level:  Minimal  Participation Quality:  Appropriate  Affect:  Appropriate  Cognitive:  Appropriate  Insight:  Limited  Engagement in Therapy:  Limited  Modes of Intervention:  Discussion  Summary of Progress/Problems:Patient participated in group today during which the discussion was about coping strategies. In group we discussed what are negative coping strategies and how we have developed them over the years. Then processed examples of positive coping mechanisms.  The group then processed how to use positive attributes in order to develop their copingmechanisms. Group proceded through discussion and open dialogue.   Beverly SessionsLINDSEY, Jjesus Dingley J 01/05/2014, 12:42 PM

## 2014-01-05 NOTE — Progress Notes (Signed)
Patient ID: Augusto GambleJuanita F Jessop, female   DOB: [birth year], 48 y.o.   MRN: [id number] D)  Was in bed sleeping at the beginning of the shift, didn't attend group.  Came out to the dayroom later and had a snack, came to the med window for hs meds, also c/o back pain and was medicated and given heat packs as well.  Stated was feeling anxious and wanted ativan as well as hs seroquel, went to bed shortly after. A)  Will continue to monitor for safety, continue POC R)  Safety maintained.

## 2014-01-05 NOTE — Discharge Summary (Signed)
Physician Discharge Summary Note  Patient:  [name] is an 48 y.o., female MRN:  [id number] DOB:  1965/09/21 Patient phone:  [phone] (home)  Patient address:   [address] [place] [address],  Total Time spent with patient: 30 minutes  Date of Admission:  12/31/2013 Date of Discharge: 01/05/14  Reason for Admission:  Mood stabilization treatments  Discharge Diagnoses: Active Problems:   Substance induced mood disorder   Bipolar I disorder, most recent episode depressed   Alcohol use disorder, severe, dependence   Cocaine use disorder, severe, dependence   Cannabis use disorder, severe, dependence  Psychiatric Specialty Exam: Physical Exam  Psychiatric: She has a normal mood and affect. Her speech is normal and behavior is normal. Judgment and thought content normal. Cognition and memory are normal.    Review of Systems  Constitutional: Negative.   HENT: Negative.   Eyes: Negative.   Respiratory: Negative.   Cardiovascular: Negative.   Gastrointestinal: Negative.   Genitourinary: Negative.   Musculoskeletal: Negative.   Skin: Negative.   Neurological: Negative.   Endo/Heme/Allergies: Negative.   Psychiatric/Behavioral: Positive for depression (Stabiilized with treatment) and suicidal ideas (Stabilized with treatment ). The patient is nervous/anxious (Stablized with treatment ).     Blood pressure 109/80, pulse 64, temperature 98.7 F (37.1 C), temperature source Oral, resp. rate 16, height 5' 6.14" (1.68 m), weight 60.782 kg (134 lb), SpO2 96 %.Body mass index is 21.54 kg/(m^2).   Past Psychiatric History: See H&P Diagnosis:  Hospitalizations:  Outpatient Care:  Substance Abuse Care:  Self-Mutilation:  Suicidal Attempts:  Violent Behaviors:   Musculoskeletal: Strength & Muscle Tone: within normal limits Gait & Station: normal Patient leans: N/A  DSM5:  Primary Psychiatric Diagnosis: Bipolar disorder,most recent episode  ,depressed,severe(improving)  Secondary Psychiatric Diagnosis: Alcohol use disorder,severe Cannabis use disorder,severe Stimulant use disorder,cocaine type ,severe  Non Psychiatric Diagnosis: See pmh Past Medical History  Diagnosis Date  . Bipolar 1 disorder   . Schizophrenia   . Anxiety   . Fibromyalgia   . GERD (gastroesophageal reflux disease)   . Hypertension   . Migraine   . Seizures    Level of Care:  OP  Hospital Course:  [name] is an 48 y.o. female presenting to [facility] ED with the chief complaint of depression. Pt stated "I am dealing with a lot of stress and I am out of some of my med". "I can't deal with society; everything is getting on my nerves". Pt is endorsing SI but denies a plan. Pt stated "I don't feel like being here but not by no train". "I would take the quickest way out". Pt reported that she has attempted suicide multiple times in the past and shared that she has a history of cutting. Pt did not report any recent self-injurious behaviors. Pt is endorsing multiple depressive symptoms and shared that her sleep and appetite have been poor. Pt also shared that she is dealing with multiple stressors such as financial problems and "the people around me". Pt reported that she is currently receiving medication management but did not report any outpatient therapy treatment. Pt did not report any AVH or HI but shared that she has seen spirits in the past. Pt denies any alcohol or drug abuse; however her UDS is positive for THC and cocaine. Pt's BAL is also 155. Pt did not report any physical, sexual or emotional abuse at this time.  Pt is alert and oriented x3. Pt maintained poor eye contact  throughout this assessment. Pt speech is normal and her thought process is logical and coherent. Pt mood is anxious and her affect is flat. Pt judgment and insight are fair. Pt reported that she lives alone; however she can count on her daughter for support.  Pt is unable to reliably contract for safety at this time. Pt stated "if I go home it would be disastrous".          Augusto GambleJuanita F Siguenza was admitted to the adult unit where she was evaluated and her symptoms were identified. Medication management was discussed and implemented.  Her Paxil was reordered at 40 mg daily for depression. Her Seroquel was increased to 400 mg hs for improved mood stability. The patient was placed on the Librium protocol for possible alcohol withdrawal. Her liver enzymes were noted to be slightly elevated in the pattern of alcohol abuse. The patient was also started on Tegretol 200 mg BID for better mood control due to history of Bipolar Disorder. She was encouraged to participate in unit programming. Medical problems were identified and treated appropriately. Home medication was restarted as needed.  She was evaluated each day by a clinical provider to ascertain the patient's response to treatment.  Improvement was noted by the patient's report of decreasing symptoms, improved sleep and appetite, affect, medication tolerance, behavior, and participation in unit programming.  The patient was asked each day to complete a self inventory noting mood, mental status, pain, new symptoms, anxiety and concerns. Patient expressed satisfaction with her medication regimen reporting her mood to be "more stable." The patient [facility] to address issues of substance abuse. Her urine drug screen was positive for cocaine and marijuana.          She responded well to medication and being in a therapeutic and supportive environment. Positive and appropriate behavior was noted and the patient was motivated for recovery.  She worked closely with the treatment team and case manager to develop a discharge plan with appropriate goals. Coping skills, problem solving as well as relaxation therapies were also part of the unit programming.         By the day of discharge she  was in much improved condition than upon admission.  Symptoms were reported as significantly decreased or resolved completely. The patient denied SI/HI and voiced no AVH. She was motivated to continue taking medication with a goal of continued improvement in mental health.   Augusto GambleJuanita F Dunlevy was discharged home with a plan to follow up as noted below. Patient was provided with medication samples and prescriptions at time of discharge. She left [facility] in stable condition with all belongings returned to her.   Consults:  psychiatry  Significant Diagnostic Studies:  Chemistry panel, CBC, TSH, UDS positive for cocaine/marijuana  Discharge Vitals:   Blood pressure 109/80, pulse 64, temperature 98.7 F (37.1 C), temperature source Oral, resp. rate 16, height 5' 6.14" (1.68 m), weight 60.782 kg (134 lb), SpO2 96 %. Body mass index is 21.54 kg/(m^2). Lab Results:   Results for orders placed or performed during the hospital encounter of 12/31/13 (from the past 72 hour(s))  TSH     Status: None   Collection Time: 01/02/14  7:33 PM  Result Value Ref Range   TSH 1.330 0.350 - 4.500 uIU/mL    Comment: [facility]    Physical Findings: AIMS: Facial and Oral Movements Muscles of Facial Expression: None, normal Lips and Perioral Area: None, normal  Jaw: None, normal Tongue: None, normal,Extremity Movements Upper (arms, wrists, hands, fingers): None, normal Lower (legs, knees, ankles, toes): None, normal, Trunk Movements Neck, shoulders, hips: None, normal, Overall Severity Severity of abnormal movements (highest score from questions above): None, normal Incapacitation due to abnormal movements: None, normal Patient's awareness of abnormal movements (rate only patient's report): No Awareness, Dental Status Current problems with teeth and/or dentures?: No Does patient usually wear dentures?: No  CIWA:  CIWA-Ar Total: 1 COWS:     Psychiatric Specialty Exam: See Psychiatric  Specialty Exam and Suicide Risk Assessment completed by Attending Physician prior to discharge.  Discharge destination:  Home  Is patient on multiple antipsychotic therapies at discharge:  No   Has Patient had three or more failed trials of antipsychotic monotherapy by history:  No  Recommended Plan for Multiple Antipsychotic Therapies: NA  Discharge Instructions    Discharge instructions    Complete by:  As directed   Please follow up with your regular Primary Care Provider for further management of chronic medical problems.            Medication List    STOP taking these medications        ALPRAZolam 1 MG tablet  Commonly known as:  XANAX     aspirin-acetaminophen-caffeine 250-250-65 MG per tablet  Commonly known as:  EXCEDRIN MIGRAINE     GOODYS BODY PAIN PO     ibuprofen 200 MG tablet  Commonly known as:  ADVIL,MOTRIN     LORazepam 1 MG tablet  Commonly known as:  ATIVAN     oxyCODONE-acetaminophen 5-325 MG per tablet  Commonly known as:  PERCOCET     traZODone 100 MG tablet  Commonly known as:  DESYREL      TAKE these medications      Indication   albuterol 108 (90 BASE) MCG/ACT inhaler  Commonly known as:  PROVENTIL HFA;VENTOLIN HFA  Inhale 2 puffs into the lungs every 6 (six) hours as needed for wheezing or shortness of breath.      carbamazepine 200 MG tablet  Commonly known as:  TEGRETOL  Take 1 tablet (200 mg total) by mouth 2 (two) times daily.   Indication:  Manic-Depression     meloxicam 7.5 MG tablet  Commonly known as:  MOBIC  Take 1 tablet (7.5 mg total) by mouth daily. May take one additional tablet 12 hours after the first dose if pain persists. TAKE WITH FOOD, AND ZANTAC   Indication:  Joint Damage causing Pain and Loss of Function     methocarbamol 500 MG tablet  Commonly known as:  ROBAXIN  Take 1 tablet (500 mg total) by mouth every 8 (eight) hours as needed for muscle spasms.   Indication:  Musculoskeletal Pain     multivitamin  with minerals Tabs tablet  Take 1 tablet by mouth daily.   Indication:  Vitamin Supplementation     PARoxetine 40 MG tablet  Commonly known as:  PAXIL  Take 1 tablet (40 mg total) by mouth daily.   Indication:  Depressive Phase of Manic-Depression     QUEtiapine 400 MG tablet  Commonly known as:  SEROQUEL  Take 1 tablet (400 mg total) by mouth at bedtime.   Indication:  Depressive Phase of Manic-Depression, Trouble Sleeping     verapamil 40 MG tablet  Commonly known as:  CALAN  Take 20 mg by mouth daily as needed (high blood pressure).            Follow-up  Information    Follow up with Dr. Omelia Blackwater - [facility] On 01/08/2014.   Why:  You are scheduled with Dr. Omelia Blackwater on Tuesday, January 08, 2014 at 11:45   Contact information:   [address] [place], [place]   [address]  [phone]      Follow up with [facility].   Why:  You will be called at home with an appointment to see [facility] for counseling.   Contact information:   [address] [place], [place]   [address]  [phone]      Please follow up.   Why:  Please call [facility] at [phone] or [facility] at [phone] if you decide you want to follow up on residential treatment.   Contact information:   No consent need as no contact made.      Follow-up recommendations:   Activity: no restrictions Diet: regular  Comments:   Take all your medications as prescribed by your mental healthcare provider.  Report any adverse effects and or reactions from your medicines to your outpatient provider promptly.  Patient is instructed and cautioned to not engage in alcohol and or illegal drug use while on prescription medicines.  In the event of worsening symptoms, patient is instructed to call the crisis hotline, 911 and or go to the nearest ED for appropriate evaluation and treatment of symptoms.  Follow-up with your primary care provider for your other medical issues,  concerns and or health care needs.   Total Discharge Time:  Greater than 30 minutes.  SignedFransisca Kaufmann NP-C 01/05/2014, 9:42 AM

## 2014-01-05 NOTE — [facility] Suicide Risk Assessment (Signed)
   Demographic Factors:  Caucasian and Low socioeconomic status  Total Time spent with patient: 45 minutes  Psychiatric Specialty Exam: Physical Exam  ROS  Blood pressure 109/80, pulse 64, temperature 98.7 F (37.1 C), temperature source Oral, resp. rate 16, height 5' 6.14" (1.68 m), weight 60.782 kg (134 lb), SpO2 96 %.Body mass index is 21.54 kg/(m^2).  General Appearance: Casual  Eye Contact::  Fair  Speech:  Clear and Coherent  Volume:  Normal  Mood:  Euthymic  Affect:  Congruent  Thought Process:  Coherent  Orientation:  Full (Time, Place, and Person)  Thought Content:  WDL  Suicidal Thoughts:  No  Homicidal Thoughts:  No  Memory:  Immediate;   Fair Recent;   Fair Remote;   Fair  Judgement:  Fair  Insight:  Fair  Psychomotor Activity:  Normal  Concentration:  Fair  Recall:  [employer]Fair  Fund of Knowledge:Fair  Language: Fair  Akathisia:  No  Handed:  Right  AIMS (if indicated):     Assets:  Communication Skills Desire for Improvement  Sleep:  Number of Hours: 6.75    Musculoskeletal: Strength & Muscle Tone: within normal limits Gait & Station: normal Patient leans: N/A   Mental Status Per Nursing Assessment::   On Admission:  Suicidal ideation indicated by patient, Self-harm thoughts  Current Mental Status by Physician: Patient denies any SI/HI/AH/VH and is motivated to get help and make progress.  Loss Factors: Financial problems/change in socioeconomic status  Historical Factors: Impulsivity  Risk Reduction Factors:   Positive coping skills or problem solving skills and pt is going to a transition house.  Continued Clinical Symptoms:  Alcohol/Substance Abuse/Dependencies Previous Psychiatric Diagnoses and Treatments  Cognitive Features That Contribute To Risk:  Polarized thinking    Suicide Risk:  Minimal: No identifiable suicidal ideation.     Discharge Diagnoses: Primary Psychiatric Diagnosis: Bipolar disorder,most recent episode  ,depressed,severe(improving)   Secondary Psychiatric Diagnosis: Alcohol use disorder,severe Cannabis use disorder,severe Stimulant use disorder,cocaine type ,severe   Non Psychiatric Diagnosis: See pmh Past Medical History  Diagnosis Date  . Bipolar 1 disorder   . Schizophrenia   . Anxiety   . Fibromyalgia   . GERD (gastroesophageal reflux disease)   . Hypertension   . Migraine   . Seizures     Plan Of Care/Follow-up recommendations:  Activity:  no restrictions Diet:  regular  Is patient on multiple antipsychotic therapies at discharge:  No   Has Patient had three or more failed trials of antipsychotic monotherapy by history:  No  Recommended Plan for Multiple Antipsychotic Therapies: NA    [name] 01/05/2014, 9:06 AM

## 2014-01-05 NOTE — Progress Notes (Signed)
Patient ID: Kristie GambleJuanita F Heiland, female   DOB: [birth year], 48 y.o.   MRN: [id number]  Pt was discharged home with 2 bus passes, pt reported being ready for discharge so that she can start a new journey. Pt reported being negative SI/HI, no AH/VH noted.

## 2014-01-08 NOTE — Progress Notes (Signed)
Patient Discharge Instructions:  After Visit Summary (AVS):   Faxed to:  01/08/14 Discharge Summary Note:   Faxed to:  01/08/14 Psychiatric Admission Assessment Note:   Faxed to:  01/08/14 Suicide Risk Assessment - Discharge Assessment:   Faxed to:  01/08/14 Faxed/Sent to the Next Level Care provider:  01/08/14 Next Level Care Provider Has Access to the EMR, 01/08/14  Faxed to [facility] @ [phone] Records [facility] via [facility]/Epic access.  Jerelene ReddenSheena E [place], 01/08/2014, 1:48 PM

## 2014-06-21 IMAGING — CR DG ANKLE COMPLETE 3+V*R*
3 series · 3 of 3 positions shown · non-contrast
Comparison: none

[x ankle ap right]
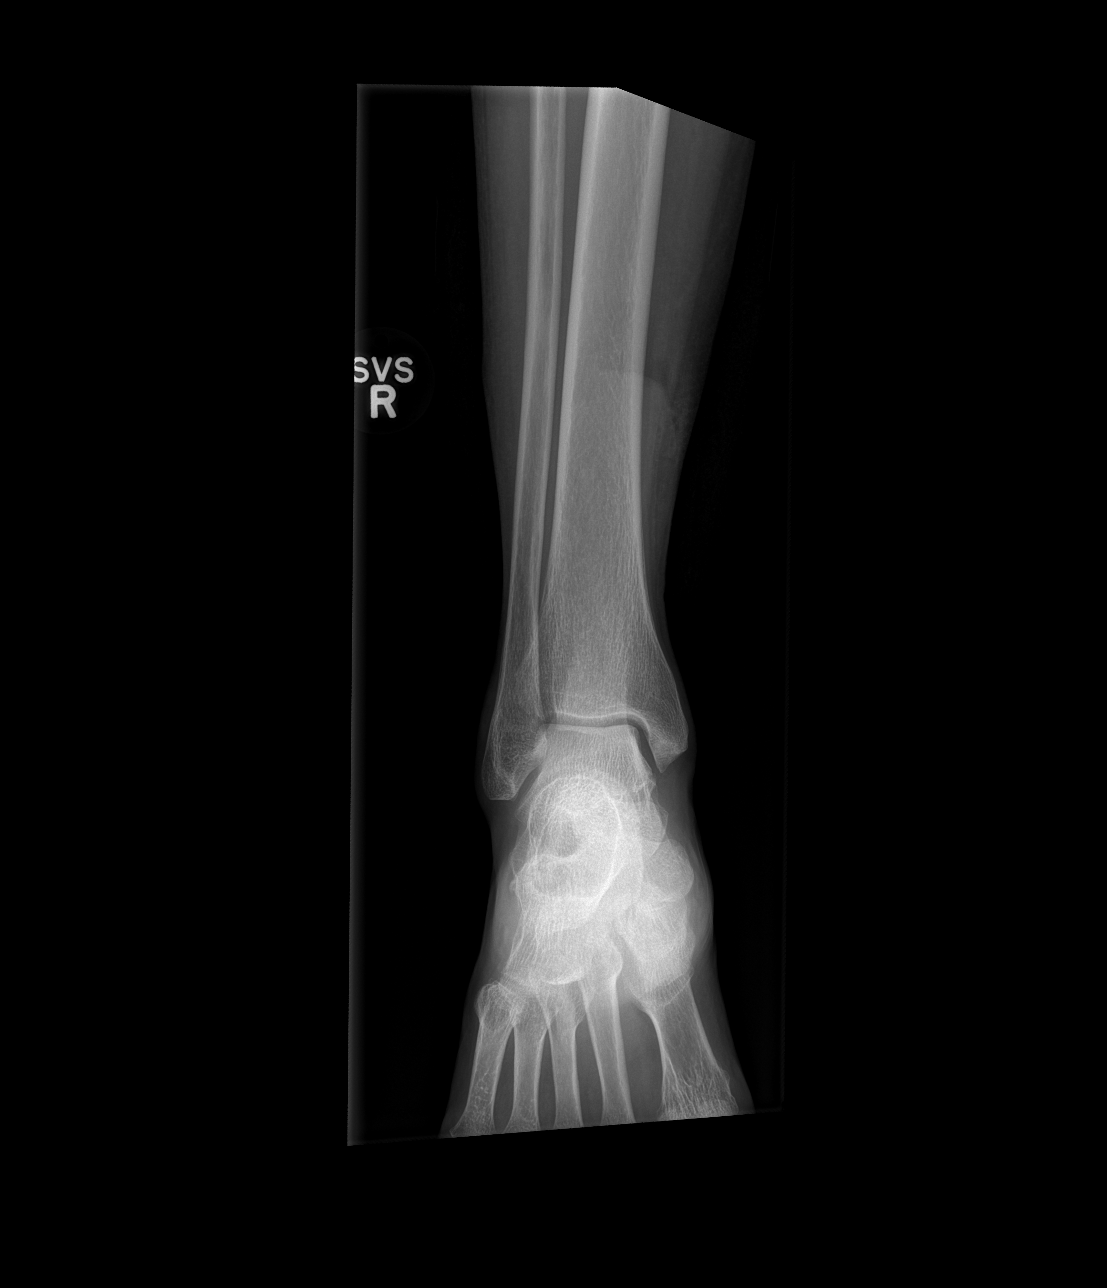

[x ankle obl right]
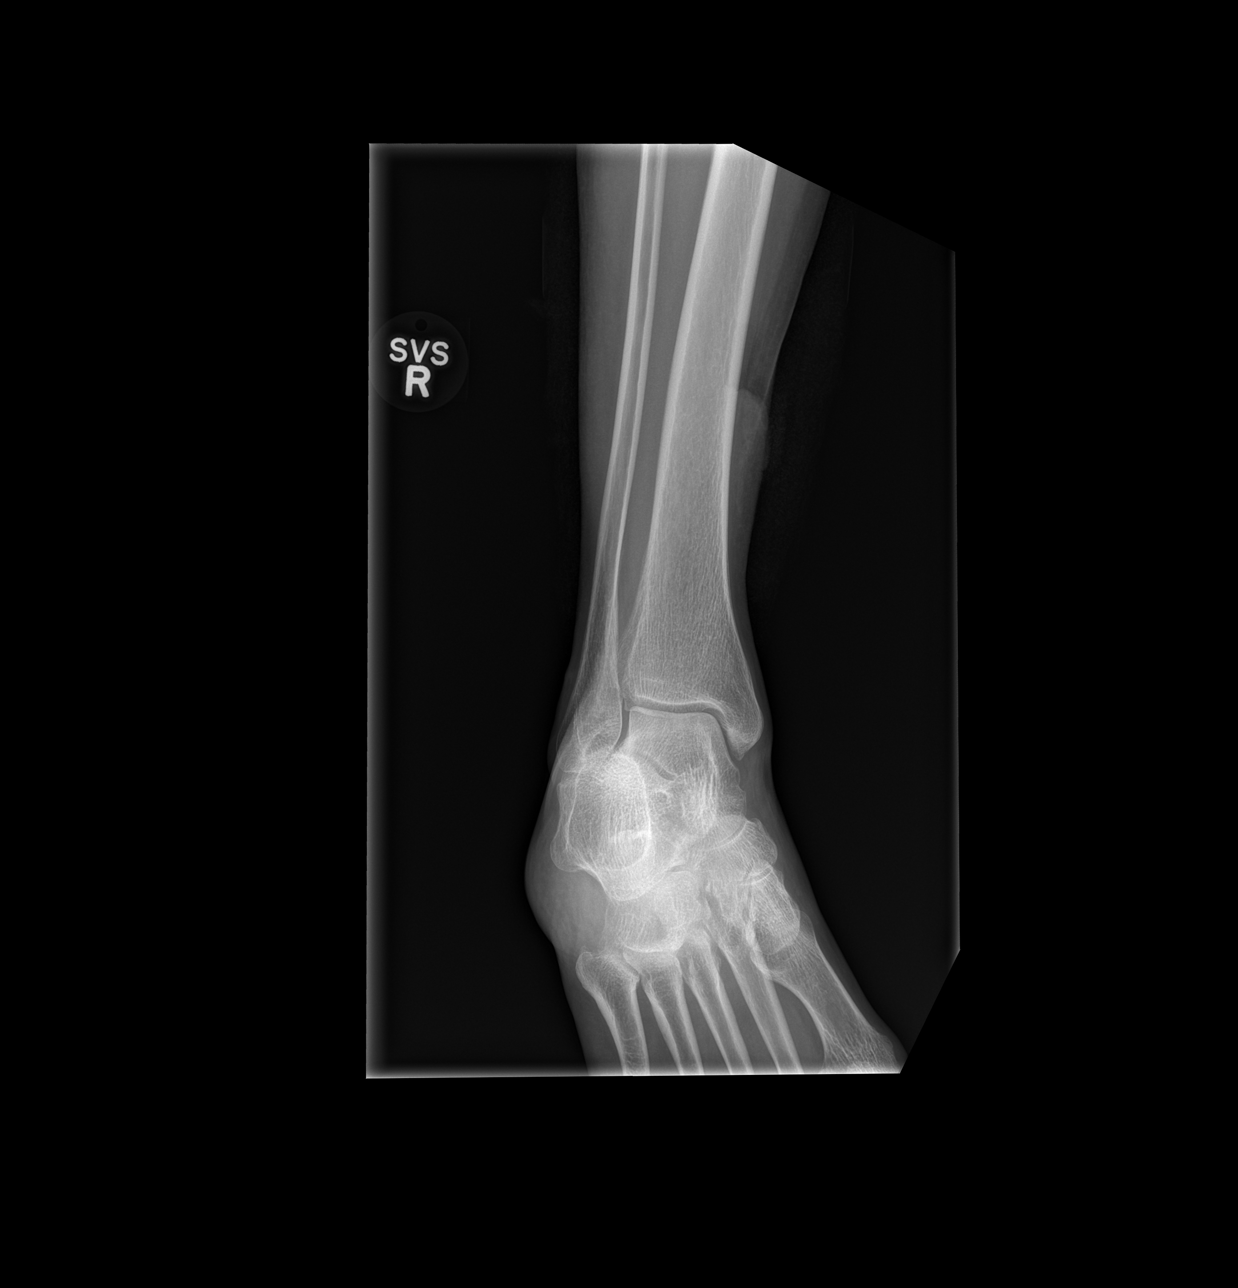

[x ankle lat right]
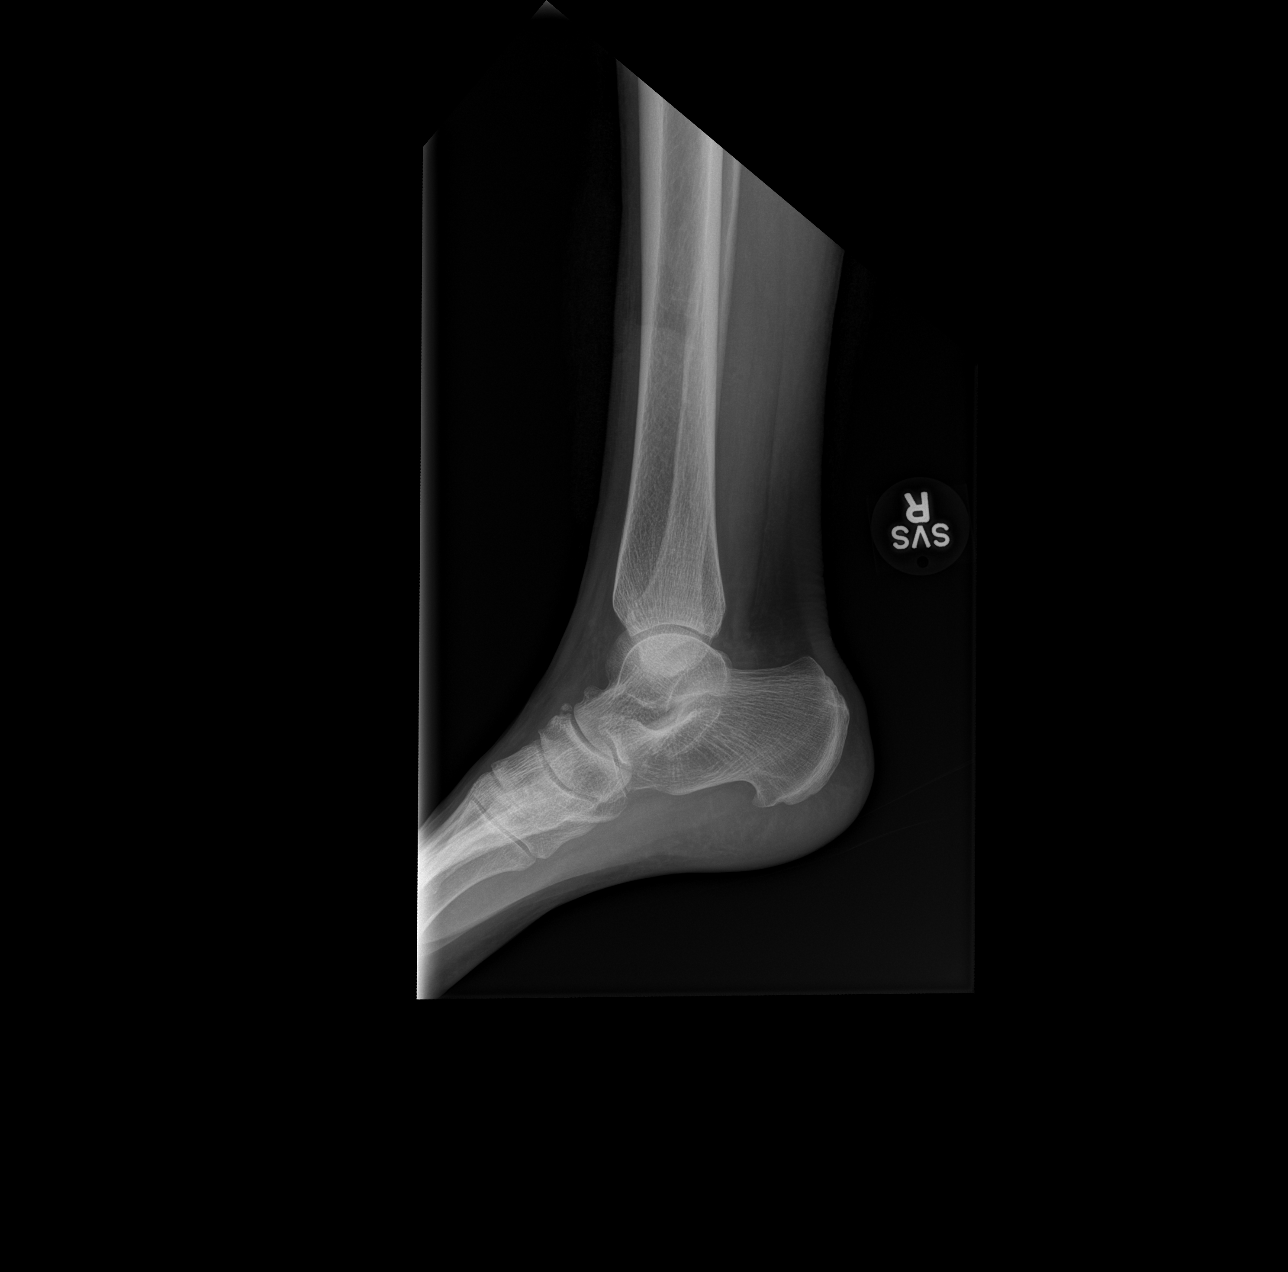

[3 of 3 positions shown; findings below may reference images not displayed]

CLINICAL DATA
Lower leg laceration

EXAM
RIGHT ANKLE - COMPLETE 3+ VIEW

COMPARISON
None.

FINDINGS
Anterior tibial lower leg soft tissue laceration noted. No
radiopaque foreign body. No underlying fracture. Right distal tibia
and fibula intact. Normal alignment at the ankle joint.

IMPRESSION
Soft tissue injury compatible with laceration. No acute osseous
finding or radiopaque foreign body

SIGNATURE

## 2014-07-21 ENCOUNTER — Emergency Department (HOSPITAL_COMMUNITY): Payer: Medicare Other

## 2014-07-21 ENCOUNTER — Emergency Department (HOSPITAL_COMMUNITY)
Admission: EM | Admit: 2014-07-21 | Discharge: 2014-07-21 | Disposition: A | Discharge status: Home / Self Care | Payer: Medicare Other | Attending: Emergency Medicine | Admitting: Emergency Medicine

## 2014-07-21 ENCOUNTER — Encounter (HOSPITAL_COMMUNITY): Payer: Self-pay | Admitting: *Deleted

## 2014-07-21 DIAGNOSIS — R05 Cough: Secondary | ICD-10-CM | POA: Diagnosis present

## 2014-07-21 DIAGNOSIS — F319 Bipolar disorder, unspecified: Secondary | ICD-10-CM | POA: Insufficient documentation

## 2014-07-21 DIAGNOSIS — F419 Anxiety disorder, unspecified: Secondary | ICD-10-CM | POA: Diagnosis not present

## 2014-07-21 DIAGNOSIS — Z8719 Personal history of other diseases of the digestive system: Secondary | ICD-10-CM | POA: Diagnosis not present

## 2014-07-21 DIAGNOSIS — R197 Diarrhea, unspecified: Secondary | ICD-10-CM | POA: Insufficient documentation

## 2014-07-21 DIAGNOSIS — H9209 Otalgia, unspecified ear: Secondary | ICD-10-CM | POA: Insufficient documentation

## 2014-07-21 DIAGNOSIS — Z79899 Other long term (current) drug therapy: Secondary | ICD-10-CM | POA: Diagnosis not present

## 2014-07-21 DIAGNOSIS — F209 Schizophrenia, unspecified: Secondary | ICD-10-CM | POA: Insufficient documentation

## 2014-07-21 DIAGNOSIS — Z8739 Personal history of other diseases of the musculoskeletal system and connective tissue: Secondary | ICD-10-CM | POA: Insufficient documentation

## 2014-07-21 DIAGNOSIS — I10 Essential (primary) hypertension: Secondary | ICD-10-CM | POA: Insufficient documentation

## 2014-07-21 DIAGNOSIS — Z720 Tobacco use: Secondary | ICD-10-CM | POA: Insufficient documentation

## 2014-07-21 DIAGNOSIS — J209 Acute bronchitis, unspecified: Secondary | ICD-10-CM

## 2014-07-21 LAB — RAPID STREP SCREEN (MED CTR MEBANE ONLY): Streptococcus, Group A Screen (Direct): NEGATIVE

## 2014-07-21 MED ORDER — ACETAMINOPHEN 325 MG PO TABS
650.0000 mg | ORAL_TABLET | Freq: Once | ORAL | Status: AC
  Administered 2014-07-21: 650 mg via ORAL
  Filled 2014-07-21: qty 2

## 2014-07-21 MED ORDER — BENZONATATE 100 MG PO CAPS: 100.0000 mg | ORAL_CAPSULE | Freq: Three times a day (TID) | ORAL | Status: DC

## 2014-07-21 MED ORDER — ALBUTEROL SULFATE HFA 108 (90 BASE) MCG/ACT IN AERS: 1.0000 | INHALATION_SPRAY | Freq: Four times a day (QID) | RESPIRATORY_TRACT | Status: DC | PRN

## 2014-07-21 MED ORDER — ALBUTEROL SULFATE (2.5 MG/3ML) 0.083% IN NEBU
5.0000 mg | INHALATION_SOLUTION | Freq: Once | RESPIRATORY_TRACT | Status: AC
  Administered 2014-07-21: 5 mg via RESPIRATORY_TRACT
  Filled 2014-07-21: qty 6

## 2014-07-21 MED ORDER — IPRATROPIUM BROMIDE 0.02 % IN SOLN
0.5000 mg | Freq: Once | RESPIRATORY_TRACT | Status: AC
  Administered 2014-07-21: 0.5 mg via RESPIRATORY_TRACT
  Filled 2014-07-21: qty 2.5

## 2014-07-21 MED ORDER — HYDROCOD POLST-CPM POLST ER 10-8 MG/5ML PO SUER
5.0000 mL | Freq: Once | ORAL | Status: AC
  Administered 2014-07-21: 5 mL via ORAL
  Filled 2014-07-21: qty 5

## 2014-07-21 NOTE — ED Notes (Signed)
Pt outside smoking

## 2014-07-21 NOTE — Discharge Instructions (Signed)
Acute Bronchitis Bronchitis is inflammation of the airways that extend from the windpipe into the lungs (bronchi). The inflammation often causes mucus to develop. This leads to a cough, which is the most common symptom of bronchitis.  In acute bronchitis, the condition usually develops suddenly and goes away over time, usually in a couple weeks. Smoking, allergies, and asthma can make bronchitis worse. Repeated episodes of bronchitis may cause further lung problems.  CAUSES Acute bronchitis is most often caused by the same virus that causes a cold. The virus can spread from person to person (contagious) through coughing, sneezing, and touching contaminated objects. SIGNS AND SYMPTOMS   Cough.   Fever.   Coughing up mucus.   Body aches.   Chest congestion.   Chills.   Shortness of breath.   Sore throat.  DIAGNOSIS  Acute bronchitis is usually diagnosed through a physical exam. Your health care provider will also ask you questions about your medical history. Tests, such as chest X-rays, are sometimes done to rule out other conditions.  TREATMENT  Acute bronchitis usually goes away in a couple weeks. Oftentimes, no medical treatment is necessary. Medicines are sometimes given for relief of fever or cough. Antibiotic medicines are usually not needed but may be prescribed in certain situations. In some cases, an inhaler may be recommended to help reduce shortness of breath and control the cough. A cool mist vaporizer may also be used to help thin bronchial secretions and make it easier to clear the chest.  HOME CARE INSTRUCTIONS  Get plenty of rest.   Drink enough fluids to keep your urine clear or pale yellow (unless you have a medical condition that requires fluid restriction). Increasing fluids may help thin your respiratory secretions (sputum) and reduce chest congestion, and it will prevent dehydration.   Take medicines only as directed by your health care provider.  If  you were prescribed an antibiotic medicine, finish it all even if you start to feel better.  Avoid smoking and secondhand smoke. Exposure to cigarette smoke or irritating chemicals will make bronchitis worse. If you are a smoker, consider using nicotine gum or skin patches to help control withdrawal symptoms. Quitting smoking will help your lungs heal faster.   Reduce the chances of another bout of acute bronchitis by washing your hands frequently, avoiding people with cold symptoms, and trying not to touch your hands to your mouth, nose, or eyes.   Keep all follow-up visits as directed by your health care provider.  SEEK MEDICAL CARE IF: Your symptoms do not improve after 1 week of treatment.  SEEK IMMEDIATE MEDICAL CARE IF:  You develop an increased fever or chills.   You have chest pain.   You have severe shortness of breath.  You have bloody sputum.   You develop dehydration.  You faint or repeatedly feel like you are going to pass out.  You develop repeated vomiting.  You develop a severe headache. MAKE SURE YOU:   Understand these instructions.  Will watch your condition.  Will get help right away if you are not doing well or get worse. Document Released: 03/11/2004 Document Revised: 06/18/2013 Document Reviewed: 07/25/2012 [employer] Patient Information 2015 [employer] [place]. This information is not intended to replace advice given to you by your health care provider. Make sure you discuss any questions you have with your health care provider. Sore Throat A sore throat is pain, burning, irritation, or scratchiness of the throat. There is often pain or tenderness when swallowing or talking.  A sore throat may be accompanied by other symptoms, such as coughing, sneezing, fever, and swollen neck glands. A sore throat is often the first sign of another sickness, such as a cold, flu, strep throat, or mononucleosis (commonly known as mono). Most sore throats go away without  medical treatment. CAUSES  The most common causes of a sore throat include:  A viral infection, such as a cold, flu, or mono.  A bacterial infection, such as strep throat, tonsillitis, or whooping cough.  Seasonal allergies.  Dryness in the air.  Irritants, such as smoke or pollution.  Gastroesophageal reflux disease (GERD). HOME CARE INSTRUCTIONS   Only take over-the-counter medicines as directed by your caregiver.  Drink enough fluids to keep your urine clear or pale yellow.  Rest as needed.  Try using throat sprays, lozenges, or sucking on hard candy to ease any pain (if older than 4 years or as directed).  Sip warm liquids, such as broth, herbal tea, or warm water with honey to relieve pain temporarily. You may also eat or drink cold or frozen liquids such as frozen ice pops.  Gargle with salt water (mix 1 tsp salt with 8 oz of water).  Do not smoke and avoid secondhand smoke.  Put a cool-mist humidifier in your bedroom at night to moisten the air. You can also turn on a hot shower and sit in the bathroom with the door closed for 5-10 minutes. SEEK IMMEDIATE MEDICAL CARE IF:  You have difficulty breathing.  You are unable to swallow fluids, soft foods, or your saliva.  You have increased swelling in the throat.  Your sore throat does not get better in 7 days.  You have nausea and vomiting.  You have a fever or persistent symptoms for more than 2-3 days.  You have a fever and your symptoms suddenly get worse. MAKE SURE YOU:   Understand these instructions.  Will watch your condition.  Will get help right away if you are not doing well or get worse. Document Released: 03/11/2004 Document Revised: 01/19/2012 Document Reviewed: 10/10/2011 [facility] Patient Information 2015 [place], [place]. This information is not intended to replace advice given to you by your health care provider. Make sure you discuss any questions you have with your health care provider.

## 2014-07-21 NOTE — ED Provider Notes (Signed)
CSN: [id number]     Arrival date & time 07/21/14  1727 History   First MD Initiated Contact with Patient 07/21/14 2054     No chief complaint on file.  [name] is a 49 y.o. female with a history of bipolar disorder, schizophrenia, fibromyalgia, GERD, hypertension and is a chronic smoker who presents to the emergency department complaining of a cough ongoing for the past 4 days. Patient also reports associated sore throat and swollen glands for the same amount of time. Patient reports she had one episode of posttussive emesis today. She also reports having loose stools for the past 4 days. She reports one loose stool today. She denies fevers but reports some chills. She reports productive cough with clear sputum. She has taken nothing for treatment today. She went to 6 out of 10 throat pain. The patient denies fevers, hemoptysis, current nausea, urinary symptoms, hematuria, chest pain, palpitations, hematochezia, rashes, or dental pain.   (Consider location/radiation/quality/duration/timing/severity/associated sxs/prior Treatment) HPI  Past Medical History  Diagnosis Date  . Bipolar 1 disorder   . Schizophrenia   . Anxiety   . Fibromyalgia   . GERD (gastroesophageal reflux disease)   . Hypertension   . Migraine   . Seizures    Past Surgical History  Procedure Laterality Date  . Cesarean section    . Mouth surgery  2014   No family history on file. History  Substance Use Topics  . Smoking status: Current Every Day Smoker -- 1.00 packs/day for 15 years    Types: Cigarettes  . Smokeless tobacco: Never Used  . Alcohol Use: No   OB History    No data available     Review of Systems  Constitutional: Positive for chills. Negative for fever and appetite change.  HENT: Positive for ear pain, rhinorrhea and sore throat. Negative for congestion, hearing loss and trouble swallowing.   Eyes: Negative for pain and visual disturbance.  Respiratory: Positive for cough, shortness of  breath and wheezing. Negative for chest tightness.   Cardiovascular: Negative for chest pain, palpitations and leg swelling.  Gastrointestinal: Positive for diarrhea. Negative for nausea, vomiting, abdominal pain and blood in stool.  Genitourinary: Negative for dysuria, frequency, hematuria and difficulty urinating.  Musculoskeletal: Negative for back pain and neck pain.  Skin: Negative for rash.  Neurological: Negative for weakness and headaches.      Allergies  Prednisone; Claritin; Nsaids; and Sulfa antibiotics  Home Medications   Prior to Admission medications   Medication Sig Start Date End Date Taking? Authorizing Provider  ALPRAZolam Prudy Feeler(XANAX) 1 MG tablet Take 1 mg by mouth 3 (three) times daily as needed for anxiety (anxiety).   Yes Historical Provider, MD  PARoxetine (PAXIL) 40 MG tablet Take 1 tablet (40 mg total) by mouth daily. 01/05/14  Yes Thermon LeylandLaura A Davis, NP  SEROQUEL XR 300 MG 24 hr tablet Take 300 mg by mouth at bedtime.  06/17/14  Yes Historical Provider, MD  traZODone (DESYREL) 100 MG tablet Take 100 mg by mouth at bedtime.   Yes Historical Provider, MD  verapamil (CALAN) 40 MG tablet Take 20 mg by mouth daily as needed (high blood pressure).   Yes Historical Provider, MD  albuterol (PROVENTIL HFA;VENTOLIN HFA) 108 (90 BASE) MCG/ACT inhaler Inhale 1-2 puffs into the lungs every 6 (six) hours as needed for wheezing or shortness of breath. 07/21/14   Everlene FarrierWilliam Fordyce Lepak, PA-C  benzonatate (TESSALON) 100 MG capsule Take 1 capsule (100 mg total) by mouth every 8 (eight)  hours. 07/21/14   Everlene Farrier, PA-C  carbamazepine (TEGRETOL) 200 MG tablet Take 1 tablet (200 mg total) by mouth 2 (two) times daily. Patient not taking: Reported on 07/21/2014 01/05/14   Thermon Leyland, NP  meloxicam (MOBIC) 7.5 MG tablet Take 1 tablet (7.5 mg total) by mouth daily. May take one additional tablet 12 hours after the first dose if pain persists. TAKE WITH FOOD, AND ZANTAC Patient not taking: Reported on  07/21/2014 01/05/14   Thermon Leyland, NP  methocarbamol (ROBAXIN) 500 MG tablet Take 1 tablet (500 mg total) by mouth every 8 (eight) hours as needed for muscle spasms. Patient not taking: Reported on 07/21/2014 09/01/13   Mercedes Camprubi-Soms, PA-C  Multiple Vitamin (MULTIVITAMIN WITH MINERALS) TABS tablet Take 1 tablet by mouth daily. Patient not taking: Reported on 07/21/2014 01/05/14   Thermon Leyland, NP  QUEtiapine (SEROQUEL) 400 MG tablet Take 1 tablet (400 mg total) by mouth at bedtime. Patient not taking: Reported on 07/21/2014 01/05/14   Thermon Leyland, NP   BP 127/76 mmHg  Pulse 79  Temp(Src) 98.4 F (36.9 C) (Oral)  Resp 17  Ht  (1.676 m)  Wt 140 lb (63.504 kg)  BMI 22.61 kg/m2  SpO2 96% Physical Exam  Constitutional: She is oriented to person, place, and time. She appears well-developed and well-nourished. No distress.  Nontoxic appearing.  HENT:  Head: Normocephalic and atraumatic.  Right Ear: External ear normal.  Left Ear: External ear normal.  Nose: Nose normal.  Mouth/Throat: Oropharynx is clear and moist. No oropharyngeal exudate.  Bilateral tympanic membranes are pearly-gray without erythema or loss of landmarks. Mild oropharyngeal erythema without tonsillar hypertrophy or exudates. Uvula is midline without edema.  Eyes: Conjunctivae are normal. Pupils are equal, round, and reactive to light. Right eye exhibits no discharge. Left eye exhibits no discharge.  Neck: Neck supple. No JVD present. No tracheal deviation present.  Cardiovascular: Normal rate, regular rhythm, normal heart sounds and intact distal pulses.  Exam reveals no gallop and no friction rub.   No murmur heard. Bilateral radial pulses are intact.   Pulmonary/Chest: Effort normal. No respiratory distress. She has wheezes. She has no rales.  Patient coughing up clear sputum. Scattered mild wheezes bilaterally. Patient speaking in full sentences.   Abdominal: Soft. She exhibits no distension. There is no  tenderness.  Musculoskeletal: She exhibits no edema or tenderness.  No lower extremity edema or tenderness.  Lymphadenopathy:    She has no cervical adenopathy.  Neurological: She is alert and oriented to person, place, and time. Coordination normal.  Skin: Skin is warm and dry. No rash noted. She is not diaphoretic. No erythema. No pallor.  Psychiatric: She has a normal mood and affect. Her behavior is normal.  Nursing note and vitals reviewed.   ED Course  Procedures (including critical care time) Labs Review Labs Reviewed  RAPID STREP SCREEN (NOT AT [facility])  CULTURE, GROUP A STREP    Imaging Review Dg Chest 2 View  07/21/2014   CLINICAL DATA:  Nonproductive cough, wheezing and shortness of Breath.  EXAM: CHEST  2 VIEW  COMPARISON:  11/04/2012  FINDINGS: The cardiac silhouette, mediastinal and hilar contours are within normal limits and stable. There are mild bronchitic changes and suspected emphysema, likely related to smoking. No infiltrates, edema or effusions. The bony thorax is intact.  IMPRESSION: Mild chronic bronchitic changes but no acute pulmonary findings.   Electronically Signed   By: Rudie Meyer M.D.   On:  07/21/2014 18:49     EKG Interpretation   Date/Time:  Sunday July 21 2014 20:40:07 EDT Ventricular Rate:  89 PR Interval:  132 QRS Duration: 87 QT Interval:  356 QTC Calculation: 433 R Axis:   47 Text Interpretation:  Sinus rhythm Probable anteroseptal infarct, old  Confirmed by DELO  MD, DOUGLAS ([id number]) on 07/21/2014 11:13:48 PM      Filed Vitals:   07/21/14 1731 07/21/14 1805 07/21/14 2035  BP:  148/88 127/76  Pulse:  80 79  Temp:  98.7 F (37.1 C) 98.4 F (36.9 C)  TempSrc:  Oral Oral  Resp:  24 17  Height:   (1.676 m)   Weight:  140 lb (63.504 kg)   SpO2: 96% 96% 96%     MDM   Meds given in ED:  Medications  acetaminophen (TYLENOL) tablet 650 mg (650 mg Oral Given 07/21/14 2132)  albuterol (PROVENTIL) (2.5 MG/3ML) 0.083% nebulizer  solution 5 mg (5 mg Nebulization Given 07/21/14 2133)  ipratropium (ATROVENT) nebulizer solution 0.5 mg (0.5 mg Nebulization Given 07/21/14 2133)  chlorpheniramine-HYDROcodone (TUSSIONEX) 10-8 MG/5ML suspension 5 mL (5 mLs Oral Given 07/21/14 2222)    New Prescriptions   BENZONATATE (TESSALON) 100 MG CAPSULE    Take 1 capsule (100 mg total) by mouth every 8 (eight) hours.    Final diagnoses:  Acute bronchitis, unspecified organism   This is a 49 y.o. female with a history of bipolar disorder, schizophrenia, fibromyalgia, GERD, hypertension and is a chronic smoker who presents to the emergency department complaining of a cough ongoing for the past 4 days. Patient also reports associated sore throat and swollen glands for the same amount of time. Patient reports she had one episode of posttussive emesis today. Patient continues to smoke. On exam patient is afebrile and nontoxic appearing. She is not hypoxic, tachypneic or tachycardic. She has mild diffuse wheezes on lung exam. Chest x-ray shows mild chronic bronchitic changes but no acute pulmonary findings. Rapid strep is negative. Patient given breathing treatment, Tussionex andTylenol in the ED. Repeat lung exam is improved after breathing treatment. Patient with acute bronchitis. We'll discharge with prescription for albuterol inhaler and Tessalon Perles. Patient is allergic to prednisone. I advised the patient to follow-up with their primary care provider this week. I advised the patient to return to the emergency department with new or worsening symptoms or new concerns. The patient verbalized understanding and agreement with plan.    This patient was discussed with Dr. Judd Lien who agrees with assessment and plan.     Everlene Farrier, PA-C 07/21/14 2325  Geoffery Lyons, MD 07/21/14 [phone]

## 2014-07-21 NOTE — ED Notes (Signed)
Pt in lobby cursing. Pt  In and out to smoke

## 2014-07-21 NOTE — ED Notes (Signed)
Per EMS, Pt from hotel, c/o cough, sore throat, L ear pain x 5 days. Denies N/V. Pt c/o diarrhea x 1 week. Fever, chills, starting today. Pt has multiple complaints for EMS.

## 2014-07-25 LAB — CULTURE, GROUP A STREP: STREP A CULTURE: NEGATIVE

## 2014-08-02 ENCOUNTER — Observation Stay (HOSPITAL_COMMUNITY)
Admission: EM | Admit: 2014-08-02 | Discharge: 2014-08-04 | Payer: Medicare Other | Attending: Internal Medicine | Admitting: Internal Medicine

## 2014-08-02 ENCOUNTER — Encounter (HOSPITAL_COMMUNITY): Payer: Self-pay | Admitting: Emergency Medicine

## 2014-08-02 DIAGNOSIS — F1910 Other psychoactive substance abuse, uncomplicated: Secondary | ICD-10-CM | POA: Diagnosis present

## 2014-08-02 DIAGNOSIS — M797 Fibromyalgia: Secondary | ICD-10-CM | POA: Insufficient documentation

## 2014-08-02 DIAGNOSIS — G43909 Migraine, unspecified, not intractable, without status migrainosus: Secondary | ICD-10-CM | POA: Diagnosis not present

## 2014-08-02 DIAGNOSIS — K219 Gastro-esophageal reflux disease without esophagitis: Secondary | ICD-10-CM | POA: Insufficient documentation

## 2014-08-02 DIAGNOSIS — E876 Hypokalemia: Secondary | ICD-10-CM | POA: Insufficient documentation

## 2014-08-02 DIAGNOSIS — F1219 Cannabis abuse with unspecified cannabis-induced disorder: Secondary | ICD-10-CM | POA: Diagnosis not present

## 2014-08-02 DIAGNOSIS — T421X2A Poisoning by iminostilbenes, intentional self-harm, initial encounter: Principal | ICD-10-CM | POA: Diagnosis present

## 2014-08-02 DIAGNOSIS — G4700 Insomnia, unspecified: Secondary | ICD-10-CM | POA: Insufficient documentation

## 2014-08-02 DIAGNOSIS — R569 Unspecified convulsions: Secondary | ICD-10-CM | POA: Insufficient documentation

## 2014-08-02 DIAGNOSIS — F17210 Nicotine dependence, cigarettes, uncomplicated: Secondary | ICD-10-CM | POA: Insufficient documentation

## 2014-08-02 DIAGNOSIS — F19288 Other psychoactive substance dependence with other psychoactive substance-induced disorder: Secondary | ICD-10-CM | POA: Diagnosis not present

## 2014-08-02 DIAGNOSIS — F1424 Cocaine dependence with cocaine-induced mood disorder: Secondary | ICD-10-CM | POA: Insufficient documentation

## 2014-08-02 DIAGNOSIS — G43109 Migraine with aura, not intractable, without status migrainosus: Secondary | ICD-10-CM | POA: Diagnosis not present

## 2014-08-02 DIAGNOSIS — I10 Essential (primary) hypertension: Secondary | ICD-10-CM | POA: Insufficient documentation

## 2014-08-02 DIAGNOSIS — F1020 Alcohol dependence, uncomplicated: Secondary | ICD-10-CM | POA: Diagnosis not present

## 2014-08-02 DIAGNOSIS — F1994 Other psychoactive substance use, unspecified with psychoactive substance-induced mood disorder: Secondary | ICD-10-CM

## 2014-08-02 DIAGNOSIS — F12288 Cannabis dependence with other cannabis-induced disorder: Secondary | ICD-10-CM | POA: Insufficient documentation

## 2014-08-02 DIAGNOSIS — F1420 Cocaine dependence, uncomplicated: Secondary | ICD-10-CM

## 2014-08-02 DIAGNOSIS — Z882 Allergy status to sulfonamides status: Secondary | ICD-10-CM | POA: Insufficient documentation

## 2014-08-02 DIAGNOSIS — F209 Schizophrenia, unspecified: Secondary | ICD-10-CM | POA: Insufficient documentation

## 2014-08-02 DIAGNOSIS — Z888 Allergy status to other drugs, medicaments and biological substances status: Secondary | ICD-10-CM | POA: Diagnosis not present

## 2014-08-02 DIAGNOSIS — M5432 Sciatica, left side: Secondary | ICD-10-CM | POA: Insufficient documentation

## 2014-08-02 DIAGNOSIS — F319 Bipolar disorder, unspecified: Secondary | ICD-10-CM | POA: Insufficient documentation

## 2014-08-02 DIAGNOSIS — Y9289 Other specified places as the place of occurrence of the external cause: Secondary | ICD-10-CM | POA: Diagnosis not present

## 2014-08-02 DIAGNOSIS — M62838 Other muscle spasm: Secondary | ICD-10-CM | POA: Diagnosis not present

## 2014-08-02 DIAGNOSIS — R42 Dizziness and giddiness: Secondary | ICD-10-CM

## 2014-08-02 DIAGNOSIS — Z590 Homelessness: Secondary | ICD-10-CM | POA: Diagnosis not present

## 2014-08-02 LAB — CARBAMAZEPINE LEVEL, TOTAL: CARBAMAZEPINE LVL: 23.2 ug/mL — AB (ref 4.0–12.0)

## 2014-08-02 LAB — PROTIME-INR
INR: 1.04 (ref 0.00–1.49)
PROTHROMBIN TIME: 13.8 s (ref 11.6–15.2)

## 2014-08-02 LAB — RAPID URINE DRUG SCREEN, HOSP PERFORMED
Amphetamines: NOT DETECTED
BENZODIAZEPINES: POSITIVE — AB
Barbiturates: NOT DETECTED
Cocaine: POSITIVE — AB
Opiates: NOT DETECTED
Tetrahydrocannabinol: POSITIVE — AB

## 2014-08-02 LAB — TSH: TSH: 0.437 u[IU]/mL (ref 0.350–4.500)

## 2014-08-02 LAB — CBC
HCT: 40.8 % (ref 36.0–46.0)
Hemoglobin: 12.9 g/dL (ref 12.0–15.0)
MCH: 33.7 pg (ref 26.0–34.0)
MCHC: 31.6 g/dL (ref 30.0–36.0)
MCV: 106.5 fL — ABNORMAL HIGH (ref 78.0–100.0)
PLATELETS: 285 10*3/uL (ref 150–400)
RBC: 3.83 MIL/uL — AB (ref 3.87–5.11)
RDW: 13.9 % (ref 11.5–15.5)
WBC: 4.1 10*3/uL (ref 4.0–10.5)

## 2014-08-02 LAB — COMPREHENSIVE METABOLIC PANEL
ALT: 36 U/L (ref 14–54)
AST: 44 U/L — AB (ref 15–41)
Albumin: 3.9 g/dL (ref 3.5–5.0)
Alkaline Phosphatase: 93 U/L (ref 38–126)
Anion gap: 11 (ref 5–15)
BILIRUBIN TOTAL: 0.3 mg/dL (ref 0.3–1.2)
BUN: 8 mg/dL (ref 6–20)
CALCIUM: 8.5 mg/dL — AB (ref 8.9–10.3)
CO2: 25 mmol/L (ref 22–32)
Chloride: 108 mmol/L (ref 101–111)
Creatinine, Ser: 0.65 mg/dL (ref 0.44–1.00)
GFR calc Af Amer: >60 mL/min (ref >60)
GFR calc non Af Amer: >60 mL/min (ref >60)
Glucose, Bld: 127 mg/dL — ABNORMAL HIGH (ref 65–99)
Potassium: 4.4 mmol/L (ref 3.5–5.1)
Sodium: 144 mmol/L (ref 135–145)
Total Protein: 7.3 g/dL (ref 6.5–8.1)

## 2014-08-02 LAB — ACETAMINOPHEN LEVEL: Acetaminophen (Tylenol), Serum: <10 ug/mL — ABNORMAL LOW (ref 10–30)

## 2014-08-02 LAB — SALICYLATE LEVEL

## 2014-08-02 LAB — ETHANOL: Alcohol, Ethyl (B): <5 mg/dL (ref <5)

## 2014-08-02 MED ORDER — HEPARIN SODIUM (PORCINE) 5000 UNIT/ML IJ SOLN
5000.0000 [IU] | Freq: Three times a day (TID) | INTRAMUSCULAR | Status: DC
  Administered 2014-08-03 – 2014-08-04 (×4): 5000 [IU] via SUBCUTANEOUS
  Filled 2014-08-02 (×8): qty 1

## 2014-08-02 MED ORDER — ACETAMINOPHEN 650 MG RE SUPP
650.0000 mg | Freq: Four times a day (QID) | RECTAL | Status: DC | PRN
  Administered 2014-08-03: 650 mg via RECTAL
  Filled 2014-08-02: qty 1

## 2014-08-02 MED ORDER — ALBUTEROL SULFATE (2.5 MG/3ML) 0.083% IN NEBU: 2.5000 mg | INHALATION_SOLUTION | Freq: Four times a day (QID) | RESPIRATORY_TRACT | Status: DC | PRN

## 2014-08-02 MED ORDER — ONDANSETRON 4 MG PO TBDP
4.0000 mg | ORAL_TABLET | Freq: Once | ORAL | Status: AC
  Administered 2014-08-02: 4 mg via ORAL
  Filled 2014-08-02: qty 1

## 2014-08-02 MED ORDER — SODIUM CHLORIDE 0.9 % IV SOLN: INTRAVENOUS | Status: DC

## 2014-08-02 MED ORDER — SODIUM CHLORIDE 0.9 % IV BOLUS (SEPSIS)
1000.0000 mL | Freq: Once | INTRAVENOUS | Status: AC
  Administered 2014-08-02: 1000 mL via INTRAVENOUS

## 2014-08-02 MED ORDER — SODIUM CHLORIDE 0.9 % IV SOLN
INTRAVENOUS | Status: DC
  Administered 2014-08-02: 16:00:00 via INTRAVENOUS

## 2014-08-02 MED ORDER — MORPHINE SULFATE 2 MG/ML IJ SOLN: 1.0000 mg | INTRAMUSCULAR | Status: DC | PRN

## 2014-08-02 MED ORDER — METHOCARBAMOL 500 MG PO TABS
500.0000 mg | ORAL_TABLET | Freq: Three times a day (TID) | ORAL | Status: DC | PRN
  Administered 2014-08-02 – 2014-08-04 (×4): 500 mg via ORAL
  Filled 2014-08-02 (×4): qty 1

## 2014-08-02 MED ORDER — HYDROCODONE-ACETAMINOPHEN 5-325 MG PO TABS: 1.0000 | ORAL_TABLET | ORAL | Status: DC | PRN

## 2014-08-02 MED ORDER — ONDANSETRON HCL 4 MG PO TABS
4.0000 mg | ORAL_TABLET | Freq: Four times a day (QID) | ORAL | Status: DC | PRN
  Administered 2014-08-02 – 2014-08-04 (×4): 4 mg via ORAL
  Filled 2014-08-02 (×5): qty 1

## 2014-08-02 MED ORDER — LORAZEPAM 2 MG/ML IJ SOLN
1.0000 mg | Freq: Once | INTRAMUSCULAR | Status: AC
  Administered 2014-08-02: 1 mg via INTRAVENOUS
  Filled 2014-08-02: qty 1

## 2014-08-02 MED ORDER — ONDANSETRON HCL 4 MG/2ML IJ SOLN
4.0000 mg | Freq: Four times a day (QID) | INTRAMUSCULAR | Status: DC | PRN
  Administered 2014-08-03: 4 mg via INTRAVENOUS
  Filled 2014-08-02 (×2): qty 2

## 2014-08-02 MED ORDER — ACETAMINOPHEN 325 MG PO TABS
650.0000 mg | ORAL_TABLET | Freq: Four times a day (QID) | ORAL | Status: DC | PRN
  Administered 2014-08-02 – 2014-08-04 (×6): 650 mg via ORAL
  Filled 2014-08-02 (×6): qty 2

## 2014-08-02 MED ORDER — SODIUM CHLORIDE 0.9 % IJ SOLN
3.0000 mL | Freq: Two times a day (BID) | INTRAMUSCULAR | Status: DC
  Administered 2014-08-02: 3 mL via INTRAVENOUS

## 2014-08-02 NOTE — ED Notes (Signed)
Poison Control:  Drowsy Seizures - with severe toxicity Conduction delays Unsteady on feet/ataxic/nystagmis Anticolenergic/dilated pupils Nausea/Vomiting - antiemetics fine/IV fluids as needed 6-8 hour obs  Order another Carbamazepine level 4 hours post first draw to make sure it's declining.

## 2014-08-02 NOTE — ED Notes (Signed)
Bed: WA20 Expected date:  Expected time:  Means of arrival:  Comments: EMS-OD on Tegretal

## 2014-08-02 NOTE — ED Notes (Signed)
Gave poison control update of labs etc

## 2014-08-02 NOTE — H&P (Signed)
[facility] History and Physical  [name] WPY:[id number] DOB: 1965/08/16 DOA: 08/02/2014  Referring physician: EDP [facility]CP: [facility]   Chief Complaint: OD  HPI: [name] is a 49 y.o. female with past medical history of anxiety, bipolar disorder, migraine, seizure and fibromyalgia patient brought to the hospital after she overdosed on Tegretol pills. Patient reported that she was mad at her daughter would not let her to stay with her, she reported that she took about 25 pills of her Tegretol about 2 AM. Presented with dizziness and nausea. In the ED Tegretol level is 23.2, poison control per ED physician recommended monitoring overnight, check it at 11 AM. Undetectable alcohol, acetaminophen and salicylate level, UDS is positive for benzos, cocaine and THC.    Review of Systems:  Constitutional: negative for anorexia, fevers and sweats Eyes: negative for irritation, redness and visual disturbance Ears, nose, mouth, throat, and face: negative for earaches, epistaxis, nasal congestion and sore throat Respiratory: negative for cough, dyspnea on exertion, sputum and wheezing Cardiovascular: negative for chest pain, dyspnea, lower extremity edema, orthopnea, palpitations and syncope Gastrointestinal: negative for abdominal pain, constipation, diarrhea, melena, nausea and vomiting Genitourinary:negative for dysuria, frequency and hematuria Hematologic/lymphatic: negative for bleeding, easy bruising and lymphadenopathy Musculoskeletal:negative for arthralgias, muscle weakness and stiff joints Neurological: negative for coordination problems, gait problems, headaches and weakness Endocrine: negative for diabetic symptoms including polydipsia, polyuria and weight loss Allergic/Immunologic: negative for anaphylaxis, hay fever and urticaria  Past Medical History  Diagnosis Date  . Bipolar 1 disorder   . Schizophrenia   . Anxiety   . Fibromyalgia   . GERD  (gastroesophageal reflux disease)   . Hypertension   . Migraine   . Seizures    Past Surgical History  Procedure Laterality Date  . Cesarean section    . Mouth surgery  2014   Social History:   reports that she has been smoking Cigarettes.  She has a 15 pack-year smoking history. She has never used smokeless tobacco. She reports that she uses illicit drugs (Cocaine and Marijuana) about once per week. She reports that she does not drink alcohol.  Allergies  Allergen Reactions  . Prednisone Shortness Of Breath and Swelling  . Claritin [Loratadine]     Unknown  . Nsaids Other (See Comments)    GERD  . Sulfa Antibiotics Swelling    Family history Denies any family history of psychiatric conditions. Both mother and father had diabetes and hypertension  Prior to Admission medications   Medication Sig Start Date End Date Taking? Authorizing Provider  albuterol (PROVENTIL HFA;VENTOLIN HFA) 108 (90 BASE) MCG/ACT inhaler Inhale 1-2 puffs into the lungs every 6 (six) hours as needed for wheezing or shortness of breath. 07/21/14  Yes Everlene Farrier, PA-C  ALPRAZolam Prudy Feeler) 1 MG tablet Take 1 mg by mouth 3 (three) times daily as needed for anxiety (anxiety).   Yes Historical Provider, MD  carbamazepine (TEGRETOL) 200 MG tablet Take 1 tablet (200 mg total) by mouth 2 (two) times daily. 01/05/14  Yes Thermon Leyland, NP  PARoxetine (PAXIL) 40 MG tablet Take 1 tablet (40 mg total) by mouth daily. 01/05/14  Yes Thermon Leyland, NP  SEROQUEL XR 300 MG 24 hr tablet Take 300 mg by mouth at bedtime.  06/17/14  Yes Historical Provider, MD  traZODone (DESYREL) 100 MG tablet Take 100 mg by mouth at bedtime.   Yes Historical Provider, MD  benzonatate (TESSALON) 100 MG capsule Take 1 capsule (100 mg total)  by mouth every 8 (eight) hours. Patient not taking: Reported on 08/02/2014 07/21/14   Everlene Farrier, PA-C  meloxicam (MOBIC) 7.5 MG tablet Take 1 tablet (7.5 mg total) by mouth daily. May take one additional  tablet 12 hours after the first dose if pain persists. TAKE WITH FOOD, AND ZANTAC Patient not taking: Reported on 07/21/2014 01/05/14   Thermon Leyland, NP  methocarbamol (ROBAXIN) 500 MG tablet Take 1 tablet (500 mg total) by mouth every 8 (eight) hours as needed for muscle spasms. Patient not taking: Reported on 07/21/2014 09/01/13   Mercedes Camprubi-Soms, PA-C  Multiple Vitamin (MULTIVITAMIN WITH MINERALS) TABS tablet Take 1 tablet by mouth daily. Patient not taking: Reported on 07/21/2014 01/05/14   Thermon Leyland, NP  QUEtiapine (SEROQUEL) 400 MG tablet Take 1 tablet (400 mg total) by mouth at bedtime. Patient not taking: Reported on 07/21/2014 01/05/14   Thermon Leyland, NP   Physical Exam: Filed Vitals:   08/02/14 1530  BP: 134/70  Pulse: 75  Temp:   Resp: 13   Constitutional: Oriented to person, place, and time. Well-developed and well-nourished. Cooperative.  Head: Normocephalic and atraumatic.  Nose: Nose normal.  Mouth/Throat: Uvula is midline, oropharynx is clear and moist and mucous membranes are normal.  Eyes: Conjunctivae and EOM are normal. Pupils are equal, round, and reactive to light.  Neck: Trachea normal and normal range of motion. Neck supple.  Cardiovascular: Normal rate, regular rhythm, S1 normal, S2 normal, normal heart sounds and intact distal pulses.   Pulmonary/Chest: Effort normal and breath sounds normal.  Abdominal: Soft. Bowel sounds are normal. There is no hepatosplenomegaly. There is no tenderness.  Musculoskeletal: Normal range of motion.  Neurological: Alert and oriented to person, place, and time. Has normal strength. No cranial nerve deficit or sensory deficit.  Skin: Skin is warm, dry and intact.  Psychiatric: Has a normal mood and affect. Speech is normal and behavior is normal.   Labs on Admission:  Basic Metabolic Panel:  Recent Labs Lab 08/02/14 1136  NA 144  K 4.4  CL 108  CO2 25  GLUCOSE 127*  BUN 8  CREATININE 0.65  CALCIUM 8.5*   Liver  Function Tests:  Recent Labs Lab 08/02/14 1136  AST 44*  ALT 36  ALKPHOS 93  BILITOT 0.3  PROT 7.3  ALBUMIN 3.9   No results for input(s): LIPASE, AMYLASE in the last 168 hours. No results for input(s): AMMONIA in the last 168 hours. CBC:  Recent Labs Lab 08/02/14 1136  WBC 4.1  HGB 12.9  HCT 40.8  MCV 106.5*  PLT 285   Cardiac Enzymes: No results for input(s): CKTOTAL, CKMB, CKMBINDEX, TROPONINI in the last 168 hours.  BNP (last 3 results) No results for input(s): BNP in the last 8760 hours.  ProBNP (last 3 results) No results for input(s): PROBNP in the last 8760 hours.  CBG: No results for input(s): GLUCAP in the last 168 hours.  Radiological Exams on Admission: No results found.  EKG: Independently reviewed. Sinus rhythm, no acute changes, rate 69  Assessment/Plan Principal Problem:   Intentional carbamazepine overdose Active Problems:   Cannabis abuse with cannabis-induced disorder   Substance induced mood disorder   Alcohol use disorder, severe, dependence   Cocaine use disorder, severe, dependence    Intentional carbamazepine overdose Reported that she intentionally took 25 pills of her carbamazepine because she was mad at her daughter. Reported she wants to hurt herself back then but she is not clear on it  now. Per ED physician was in control recommended monitoring, check carbamazepine level in a.m. Telemetry, repeat EKG in a.m. No EKG changes currently. Psychiatry consulted for further recommendation.  Bipolar disorder Hold medications until evaluated by psychiatry.  Polysubstance abuse UDS is positive for THC, cocaine and benzodiazepine use. Although her urine is positive she denies any recent use for all 3 substances. Has history of alcohol abuse, undetectable on-call level.  Seizures Takes carbamazepine, hold while level is supratherapeutic. Check levels in a.m. and restart when it's therapeutic. Patient has some leg spasms but does  not look like seizures.  Leg spasms Normal potassium, started on Robaxin. This is appears to be benign spasm, similar to what she had before. No further workup.  Code Status: Full code Family Communication: plan discussed with the patient  Disposition Plan: observation telemetry   Time spent: 70 minutes  Kisha Messman A, MD [facility] Pager [phone]

## 2014-08-02 NOTE — ED Notes (Signed)
Carbamazipine Level:  23.2  Alerted primary MD

## 2014-08-02 NOTE — ED Notes (Signed)
Pt belongings taken and inventoried.   Purse 3 antiseizure meds, non-narcotic Wallet, no cash Pepper spray Tennis shoes Cell phone in purse with Health visitor  Security locker: Media planner, social security card, credit card

## 2014-08-02 NOTE — ED Notes (Signed)
Pt is homeless, wanted to go stay with her daughter, daughter said no, pt got angry and wanted to harm herself, took 54 Tegretol unknown strength. Pt presents dizzy and hypertensive per EMS.   EMS gave 250 ml NS bolus.

## 2014-08-02 NOTE — ED Provider Notes (Signed)
CSN: [id number]     Arrival date & time 08/02/14  1053 History   First MD Initiated Contact with Patient 08/02/14 1106     Chief Complaint  Patient presents with  . Ingestion  . Suicide Attempt     (Consider location/radiation/quality/duration/timing/severity/associated sxs/prior Treatment) HPI Patient reports that she took 45 of her tegretol at 2 AM. The patient states she did this because she is depressed and has a lot of problems right now and she got angry with her daughter who is not letting her stay with her currently. She states now she feels dizzy and nauseated. She reports her legs feel jittery and she needs something for that. She reports normally she takes Ativan at home. The patient denies any coingestions. She denies alcohol use. She states she occasionally smokes some marijuana. She is homeless currently. Past Medical History  Diagnosis Date  . Bipolar 1 disorder   . Schizophrenia   . Anxiety   . Fibromyalgia   . GERD (gastroesophageal reflux disease)   . Hypertension   . Migraine   . Seizures    Past Surgical History  Procedure Laterality Date  . Cesarean section    . Mouth surgery  2014   History reviewed. No pertinent family history. History  Substance Use Topics  . Smoking status: Current Every Day Smoker -- 1.00 packs/day for 15 years    Types: Cigarettes  . Smokeless tobacco: Never Used  . Alcohol Use: No   OB History    No data available     Review of Systems  10 Systems reviewed and are negative for acute change except as noted in the HPI.   Allergies  Prednisone; Claritin; Nsaids; and Sulfa antibiotics  Home Medications   Prior to Admission medications   Medication Sig Start Date End Date Taking? Authorizing Provider  albuterol (PROVENTIL HFA;VENTOLIN HFA) 108 (90 BASE) MCG/ACT inhaler Inhale 1-2 puffs into the lungs every 6 (six) hours as needed for wheezing or shortness of breath. 07/21/14  Yes Everlene Farrier, PA-C  ALPRAZolam Prudy Feeler) 1 MG  tablet Take 1 mg by mouth 3 (three) times daily as needed for anxiety (anxiety).   Yes Historical Provider, MD  carbamazepine (TEGRETOL) 200 MG tablet Take 1 tablet (200 mg total) by mouth 2 (two) times daily. 01/05/14  Yes Thermon Leyland, NP  PARoxetine (PAXIL) 40 MG tablet Take 1 tablet (40 mg total) by mouth daily. 01/05/14  Yes Thermon Leyland, NP  SEROQUEL XR 300 MG 24 hr tablet Take 300 mg by mouth at bedtime.  06/17/14  Yes Historical Provider, MD  traZODone (DESYREL) 100 MG tablet Take 100 mg by mouth at bedtime.   Yes Historical Provider, MD  benzonatate (TESSALON) 100 MG capsule Take 1 capsule (100 mg total) by mouth every 8 (eight) hours. Patient not taking: Reported on 08/02/2014 07/21/14   Everlene Farrier, PA-C  meloxicam (MOBIC) 7.5 MG tablet Take 1 tablet (7.5 mg total) by mouth daily. May take one additional tablet 12 hours after the first dose if pain persists. TAKE WITH FOOD, AND ZANTAC Patient not taking: Reported on 07/21/2014 01/05/14   Thermon Leyland, NP  methocarbamol (ROBAXIN) 500 MG tablet Take 1 tablet (500 mg total) by mouth every 8 (eight) hours as needed for muscle spasms. Patient not taking: Reported on 07/21/2014 09/01/13   Mercedes Camprubi-Soms, PA-C  Multiple Vitamin (MULTIVITAMIN WITH MINERALS) TABS tablet Take 1 tablet by mouth daily. Patient not taking: Reported on 07/21/2014 01/05/14   Gerome Apley  Earlene Plater, NP  QUEtiapine (SEROQUEL) 400 MG tablet Take 1 tablet (400 mg total) by mouth at bedtime. Patient not taking: Reported on 07/21/2014 01/05/14   Thermon Leyland, NP   BP 134/70 mmHg  Pulse 75  Temp(Src) 99 F (37.2 C) (Axillary)  Resp 13  SpO2 94% Physical Exam  Constitutional: She is oriented to person, place, and time. She appears well-developed and well-nourished.  HENT:  Head: Normocephalic and atraumatic.  Eyes: EOM are normal. Pupils are equal, round, and reactive to light.  Neck: Neck supple.  Cardiovascular: Normal rate, regular rhythm, normal heart sounds and intact  distal pulses.   Pulmonary/Chest: Effort normal and breath sounds normal.  Abdominal: Soft. Bowel sounds are normal. She exhibits no distension. There is no tenderness.  Musculoskeletal: Normal range of motion. She exhibits no edema.  The patient has bruises on bilateral forearms. Otherwise extremities are intact with normal range of motion and no effusion or deformity.  Neurological: She is alert and oriented to person, place, and time. She has normal strength. Coordination normal. GCS eye subscore is 4. GCS verbal subscore is 5. GCS motor subscore is 6.  Skin: Skin is warm, dry and intact.  Psychiatric: She has a normal mood and affect.    ED Course  Procedures (including critical care time) Labs Review Labs Reviewed  ACETAMINOPHEN LEVEL - Abnormal; Notable for the following:    Acetaminophen (Tylenol), Serum <10 (*)    All other components within normal limits  CBC - Abnormal; Notable for the following:    RBC 3.83 (*)    MCV 106.5 (*)    All other components within normal limits  COMPREHENSIVE METABOLIC PANEL - Abnormal; Notable for the following:    Glucose, Bld 127 (*)    Calcium 8.5 (*)    AST 44 (*)    All other components within normal limits  URINE RAPID DRUG SCREEN, HOSP PERFORMED - Abnormal; Notable for the following:    Cocaine POSITIVE (*)    Benzodiazepines POSITIVE (*)    Tetrahydrocannabinol POSITIVE (*)    All other components within normal limits  CARBAMAZEPINE LEVEL, TOTAL - Abnormal; Notable for the following:    Carbamazepine Lvl 23.2 (*)    All other components within normal limits  ETHANOL  SALICYLATE LEVEL    Imaging Review No results found.   EKG Interpretation   Date/Time:  Friday August 02 2014 11:21:54 EDT Ventricular Rate:  69 PR Interval:  135 QRS Duration: 81 QT Interval:  399 QTC Calculation: 427 R Axis:   72 Text Interpretation:  Sinus rhythm Probable anteroseptal infarct, old  agree. no change from old Confirmed by Donnald Garre, MD,  Lebron Conners [phone]) on  08/02/2014 12:33:12 PM     Recheck 15:40. The patient is sleeping quietly. She awakens to light voice. Her mental status is clear. She has no respiratory distress. Vital signs on the monitor are stable.  CRITICAL CARE Performed by: Arby Barrette   Total critical care time: 30  Critical care time was exclusive of separately billable procedures and treating other patients.  Critical care was necessary to treat or prevent imminent or life-threatening deterioration.  Critical care was time spent personally by me on the following activities: development of treatment plan with patient and/or surrogate as well as nursing, discussions with consultants, evaluation of patient's response to treatment, examination of patient, obtaining history from patient or surrogate, ordering and performing treatments and interventions, ordering and review of laboratory studies, ordering and review of radiographic studies, pulse  oximetry and re-evaluation of patient's condition. MDM   Final diagnoses:  Carbamazepine poisoning, intentional self-harm, initial encounter  Polysubstance abuse   Patient has remained clinically stable. She has however overdosed with a significant quantity of carbamazepine. Currently the patient is being hydrated and monitored. At this time she is not showing QRS prolongation but will need continued monitoring and then subsequent evaluation for suicidal gesture.    Arby Barrette, MD 08/02/14 [phone]

## 2014-08-03 ENCOUNTER — Encounter (HOSPITAL_COMMUNITY): Payer: Self-pay | Admitting: Radiology

## 2014-08-03 ENCOUNTER — Observation Stay (HOSPITAL_COMMUNITY): Payer: Medicare Other

## 2014-08-03 DIAGNOSIS — F3130 Bipolar disorder, current episode depressed, mild or moderate severity, unspecified: Secondary | ICD-10-CM | POA: Diagnosis not present

## 2014-08-03 DIAGNOSIS — F1910 Other psychoactive substance abuse, uncomplicated: Secondary | ICD-10-CM | POA: Diagnosis not present

## 2014-08-03 DIAGNOSIS — T421X2A Poisoning by iminostilbenes, intentional self-harm, initial encounter: Secondary | ICD-10-CM | POA: Diagnosis not present

## 2014-08-03 DIAGNOSIS — R569 Unspecified convulsions: Secondary | ICD-10-CM

## 2014-08-03 DIAGNOSIS — F1420 Cocaine dependence, uncomplicated: Secondary | ICD-10-CM | POA: Diagnosis not present

## 2014-08-03 DIAGNOSIS — F12288 Cannabis dependence with other cannabis-induced disorder: Secondary | ICD-10-CM | POA: Diagnosis not present

## 2014-08-03 DIAGNOSIS — F1020 Alcohol dependence, uncomplicated: Secondary | ICD-10-CM | POA: Diagnosis not present

## 2014-08-03 DIAGNOSIS — F1424 Cocaine dependence with cocaine-induced mood disorder: Secondary | ICD-10-CM | POA: Diagnosis not present

## 2014-08-03 DIAGNOSIS — F1219 Cannabis abuse with unspecified cannabis-induced disorder: Secondary | ICD-10-CM | POA: Diagnosis not present

## 2014-08-03 LAB — CBC
HCT: 40.5 % (ref 36.0–46.0)
Hemoglobin: 12.7 g/dL (ref 12.0–15.0)
MCH: 33.9 pg (ref 26.0–34.0)
MCHC: 31.4 g/dL (ref 30.0–36.0)
MCV: 108 fL — ABNORMAL HIGH (ref 78.0–100.0)
Platelets: 249 10*3/uL (ref 150–400)
RBC: 3.75 MIL/uL — AB (ref 3.87–5.11)
RDW: 13.9 % (ref 11.5–15.5)
WBC: 6.8 10*3/uL (ref 4.0–10.5)

## 2014-08-03 LAB — BASIC METABOLIC PANEL
Anion gap: 7 (ref 5–15)
BUN: 6 mg/dL (ref 6–20)
CALCIUM: 8.3 mg/dL — AB (ref 8.9–10.3)
CO2: 26 mmol/L (ref 22–32)
Chloride: 110 mmol/L (ref 101–111)
Creatinine, Ser: 0.65 mg/dL (ref 0.44–1.00)
Glucose, Bld: 109 mg/dL — ABNORMAL HIGH (ref 65–99)
POTASSIUM: 3.9 mmol/L (ref 3.5–5.1)
Sodium: 143 mmol/L (ref 135–145)

## 2014-08-03 LAB — VITAMIN B12: VITAMIN B 12: 511 pg/mL (ref 180–914)

## 2014-08-03 LAB — CARBAMAZEPINE LEVEL, TOTAL: Carbamazepine Lvl: 10.5 ug/mL (ref 4.0–12.0)

## 2014-08-03 LAB — FOLATE: FOLATE: 16 ng/mL (ref >5.9)

## 2014-08-03 MED ORDER — QUETIAPINE FUMARATE ER 300 MG PO TB24
300.0000 mg | ORAL_TABLET | Freq: Every day | ORAL | Status: DC
  Administered 2014-08-03: 300 mg via ORAL
  Filled 2014-08-03 (×2): qty 1

## 2014-08-03 MED ORDER — TRAZODONE HCL 100 MG PO TABS
100.0000 mg | ORAL_TABLET | Freq: Every day | ORAL | Status: DC
  Administered 2014-08-03: 100 mg via ORAL
  Filled 2014-08-03 (×2): qty 1

## 2014-08-03 MED ORDER — PAROXETINE HCL 20 MG PO TABS
40.0000 mg | ORAL_TABLET | Freq: Every day | ORAL | Status: DC
  Administered 2014-08-03 – 2014-08-04 (×2): 40 mg via ORAL
  Filled 2014-08-03 (×2): qty 2

## 2014-08-03 NOTE — Progress Notes (Signed)
[facility] PROGRESS NOTE   [name] ZOX:[id number] DOB: 23-Jan-1966 DOA: 08/02/2014 [facility]  HPI/Subjective: Still feeling dizzy, cramps all over her body especially lower extremities.   Assessment/Plan: Principal Problem:   Intentional carbamazepine overdose Active Problems:   Cannabis abuse with cannabis-induced disorder   Substance induced mood disorder   Alcohol use disorder, severe, dependence   Cocaine use disorder, severe, dependence   Seizures   Intentional carbamazepine overdose Reported that she intentionally took 25 pills of her carbamazepine because she was mad at her daughter. Reported she wants to hurt herself back then but she is not sure she wants to hurt herself now. Per ED physician poison control center recommended monitoring, check carbamazepine level in a.m. Wasn't telemetry, 12-lead EKG done yesterday and repeated this morning without any changes. Discontinue telemetry. Carbamazepine level this morning is 10.5, in the upper limit of therapeutic level  Bipolar disorder Hold medications until evaluated by psychiatry.  Polysubstance abuse UDS is positive for THC, cocaine and benzodiazepine use. Although her urine is positive she denies any recent use for all 3 substances. Has history of alcohol abuse, undetectable alcohol level.  Seizures Takes carbamazepine, hold while level is supratherapeutic. Carbamazepine level is 10.5, in the upper limit of the therapeutic level, restart Tegretol in a.m.  Leg spasms Normal potassium, calcium is lowish but not that low. started on Robaxin. This is appears to be benign spasm, similar to what she had before. No further workup. Asking about Ativan, will hold till psych evaluate her.  Code Status: Full Code Family Communication: Plan discussed with the patient. Disposition Plan: Remains inpatient Diet: Diet regular Room service appropriate?: Yes; Fluid consistency::  Thin  Consultants:  Psych  Procedures:  None  Antibiotics:  None  Objective: Filed Vitals:   08/03/14 0540  BP: 161/75  Pulse: 72  Temp: 98.5 F (36.9 C)  Resp: 18    Intake/Output Summary (Last 24 hours) at 08/03/14 1014 Last data filed at 08/03/14 0600  Gross per 24 hour  Intake 1478.33 ml  Output      0 ml  Net 1478.33 ml   There were no vitals filed for this visit.  Exam: General: Alert and awake, oriented x3, not in any acute distress. HEENT: anicteric sclera, pupils reactive to light and accommodation, EOMI CVS: S1-S2 clear, no murmur rubs or gallops Chest: clear to auscultation bilaterally, no wheezing, rales or rhonchi Abdomen: soft nontender, nondistended, normal bowel sounds, no organomegaly Extremities: no cyanosis, clubbing or edema noted bilaterally Neuro: Cranial nerves II-XII intact, no focal neurological deficits  Data Reviewed: Basic Metabolic Panel:  Recent Labs Lab 08/02/14 1136 08/03/14 0540  NA 144 143  K 4.4 3.9  CL 108 110  CO2 25 26  GLUCOSE 127* 109*  BUN 8 6  CREATININE 0.65 0.65  CALCIUM 8.5* 8.3*   Liver Function Tests:  Recent Labs Lab 08/02/14 1136  AST 44*  ALT 36  ALKPHOS 93  BILITOT 0.3  PROT 7.3  ALBUMIN 3.9   No results for input(s): LIPASE, AMYLASE in the last 168 hours. No results for input(s): AMMONIA in the last 168 hours. CBC:  Recent Labs Lab 08/02/14 1136 08/03/14 0540  WBC 4.1 6.8  HGB 12.9 12.7  HCT 40.8 40.5  MCV 106.5* 108.0*  PLT 285 249   Cardiac Enzymes: No results for input(s): CKTOTAL, CKMB, CKMBINDEX, TROPONINI in the last 168 hours. BNP (last 3 results) No results for input(s): BNP in the last 8760 hours.  ProBNP (last 3 results) No results for input(s): PROBNP in the last 8760 hours.  CBG: No results for input(s): GLUCAP in the last 168 hours.  Micro No results found for this or any previous visit (from the past 240 hour(s)).   Studies: No results  found.  Scheduled Meds: . heparin  5,000 Units Subcutaneous 3 times per day  . sodium chloride  3 mL Intravenous Q12H   Continuous Infusions: . sodium chloride 100 mL/hr at 08/02/14 1737       Time spent: 35 minutes    [facility]  [facility] Pager [phone] If 7PM-7AM, please contact night-coverage at [email or web address], password [facility] 08/03/2014, 10:14 AM

## 2014-08-03 NOTE — Consult Note (Signed)
[facility] Consult   Reason for Consult:  Suicide ideation and overdose Referring Physician:  Dr. Hartford Poli Patient Identification: DI JASMER MRN:  [id number] Principal Diagnosis: Intentional carbamazepine overdose Diagnosis:   Patient Active Problem List   Diagnosis Date Noted  . Seizures [R56.9] 08/03/2014  . Intentional carbamazepine overdose [T42.1X2A] 08/02/2014  . Bipolar I disorder, most recent episode depressed [F31.30]   . Alcohol use disorder, severe, dependence [F10.20]   . Cocaine use disorder, severe, dependence [F14.20]   . Cannabis use disorder, severe, dependence [F12.20]   . Substance induced mood disorder [F19.94] 12/31/2013  . History of cluster headache [Z86.69] 10/27/2011  . Migraine headache with aura [G43.109] 10/27/2011  . Lumbago with sciatica of left side [M54.42] 10/27/2011  . Alcohol abuse [F10.10] 10/23/2011  . Cannabis abuse with cannabis-induced disorder [F12.19] 10/23/2011  . Bipolar affective disorder [F31.9] 10/23/2011    Total Time spent with patient: 1 hour  Subjective:   [name] is a 49 y.o. female patient admitted with intentional overdose.  HPI:  [name] is a 49 y.o. female seen, chart reviewed for face-to-face psychiatric consultation and evaluation of bipolar depression, status post intentional overdose of carbamazepine and polysubstance abuse. Patient reported that she has lost her place to live and she won't her daughter to let her stay with her. Patient stated she has been staying with friends and other family members and not happy about it. Patient reportedly felt depressed and to current intentional overdose of carbamazepine with the intent to end her life. Patient stated she cannot have another place of her own until July 1 because of the financial situation. Patient reportedly has no previous acute psychiatric hospitalization but has been receiving outpatient medication management from [facility] and Dr. Herbert Seta. Patient requested all her psychiatric medications be started and also stated she does not want take Tegretol any longer. Patient recent acute psychiatric hospitalization to the [facility] is November 2015.  Patient with past medical history of anxiety, bipolar disorder, migraine, seizure and fibromyalgia patient brought to the hospital after she overdosed on Tegretol pills. Patient reported that she was mad at her daughter would not let her to stay with her, she reported that she took about 25 pills of her Tegretol about 2 AM. Presented with dizziness and nausea. In the ED Tegretol level is 23.2, poison control per ED physician recommended monitoring overnight, check it at 11 AM. Undetectable alcohol, acetaminophen and salicylate level, UDS is positive for benzos, cocaine and THC.    HPI Elements:   Location:  Bipolar depression and finally substance abuse. Quality:  Poor. Severity:  Status post intentional overdose as a suicide attempt. Timing:  Conflict with the daughter and no place to live. Duration:  Few weeks. Context:  Psychosocial stresses, polysubstance abuse.  Past Medical History:  Past Medical History  Diagnosis Date  . Bipolar 1 disorder   . Schizophrenia   . Anxiety   . Fibromyalgia   . GERD (gastroesophageal reflux disease)   . Hypertension   . Migraine   . Seizures     Past Surgical History  Procedure Laterality Date  . Cesarean section    . Mouth surgery  2014   Family History: History reviewed. No pertinent family history. Social History:  History  Alcohol Use No     History  Drug Use  . 1.00 per week  . Special: Cocaine, Marijuana    History   Social History  . Marital  Status: Single    Spouse Name: N/A  . Number of Children: N/A  . Years of Education: N/A   Social History Main Topics  . Smoking status: Current Every Day Smoker -- 1.00 packs/day for 15 years    Types: Cigarettes  . Smokeless  tobacco: Never Used  . Alcohol Use: No  . Drug Use: 1.00 per week    Special: Cocaine, Marijuana  . Sexual Activity: No   Other Topics Concern  . None   Social History Narrative   Additional Social History:                          Allergies:   Allergies  Allergen Reactions  . Prednisone Shortness Of Breath and Swelling  . Claritin [Loratadine]     Unknown  . Nsaids Other (See Comments)    GERD  . Sulfa Antibiotics Swelling    Labs:  Results for orders placed or performed during the hospital encounter of 08/02/14 (from the past 48 hour(s))  Acetaminophen level     Status: Abnormal   Collection Time: 08/02/14 11:36 AM  Result Value Ref Range   Acetaminophen (Tylenol), Serum <10 (L) 10 - 30 ug/mL    Comment:        THERAPEUTIC CONCENTRATIONS VARY SIGNIFICANTLY. A RANGE OF 10-30 ug/mL MAY BE AN EFFECTIVE CONCENTRATION FOR MANY PATIENTS. HOWEVER, SOME ARE BEST TREATED AT CONCENTRATIONS OUTSIDE THIS RANGE. ACETAMINOPHEN CONCENTRATIONS >150 ug/mL AT 4 HOURS AFTER INGESTION AND >50 ug/mL AT 12 HOURS AFTER INGESTION ARE OFTEN ASSOCIATED WITH TOXIC REACTIONS.   CBC     Status: Abnormal   Collection Time: 08/02/14 11:36 AM  Result Value Ref Range   WBC 4.1 4.0 - 10.5 K/uL   RBC 3.83 (L) 3.87 - 5.11 MIL/uL   Hemoglobin 12.9 12.0 - 15.0 g/dL   HCT 40.8 36.0 - 46.0 %   MCV 106.5 (H) 78.0 - 100.0 fL   MCH 33.7 26.0 - 34.0 pg   MCHC 31.6 30.0 - 36.0 g/dL   RDW 13.9 11.5 - 15.5 %   Platelets 285 150 - 400 K/uL  Comprehensive metabolic panel     Status: Abnormal   Collection Time: 08/02/14 11:36 AM  Result Value Ref Range   Sodium 144 135 - 145 mmol/L   Potassium 4.4 3.5 - 5.1 mmol/L   Chloride 108 101 - 111 mmol/L   CO2 25 22 - 32 mmol/L   Glucose, Bld 127 (H) 65 - 99 mg/dL   BUN 8 6 - 20 mg/dL   Creatinine, Ser 0.65 0.44 - 1.00 mg/dL   Calcium 8.5 (L) 8.9 - 10.3 mg/dL   Total Protein 7.3 6.5 - 8.1 g/dL   Albumin 3.9 3.5 - 5.0 g/dL   AST 44 (H) 15 - 41  U/L   ALT 36 14 - 54 U/L   Alkaline Phosphatase 93 38 - 126 U/L   Total Bilirubin 0.3 0.3 - 1.2 mg/dL   GFR calc non Af Amer >60 >60 mL/min   GFR calc Af Amer >60 >60 mL/min    Comment: (NOTE) The eGFR has been calculated using the CKD EPI equation. This calculation has not been validated in all clinical situations. eGFR's persistently <60 mL/min signify possible Chronic Kidney Disease.    Anion gap 11 5 - 15  Ethanol (ETOH)     Status: None   Collection Time: 08/02/14 11:36 AM  Result Value Ref Range   Alcohol, Ethyl (B) <5 <5  mg/dL    Comment:        LOWEST DETECTABLE LIMIT FOR SERUM ALCOHOL IS 5 mg/dL FOR MEDICAL PURPOSES ONLY   Salicylate level     Status: None   Collection Time: 08/02/14 11:36 AM  Result Value Ref Range   Salicylate Lvl <4.9 2.8 - 30.0 mg/dL  Urine rapid drug screen (hosp performed)not at [facility]     Status: Abnormal   Collection Time: 08/02/14 11:40 AM  Result Value Ref Range   Opiates NONE DETECTED NONE DETECTED   Cocaine POSITIVE (A) NONE DETECTED   Benzodiazepines POSITIVE (A) NONE DETECTED   Amphetamines NONE DETECTED NONE DETECTED   Tetrahydrocannabinol POSITIVE (A) NONE DETECTED   Barbiturates NONE DETECTED NONE DETECTED    Comment:        DRUG SCREEN FOR MEDICAL PURPOSES ONLY.  IF CONFIRMATION IS NEEDED FOR ANY PURPOSE, NOTIFY LAB WITHIN 5 DAYS.        LOWEST DETECTABLE LIMITS FOR URINE DRUG SCREEN Drug Class       Cutoff (ng/mL) Amphetamine      1000 Barbiturate      200 Benzodiazepine   179 Tricyclics       150 Opiates          300 Cocaine          300 THC              50   Carbamazepine level, total     Status: Abnormal   Collection Time: 08/02/14 12:54 PM  Result Value Ref Range   Carbamazepine Lvl 23.2 (HH) 4.0 - 12.0 ug/mL    Comment: RESULTS CONFIRMED BY MANUAL DILUTION CRITICAL RESULT CALLED TO, READ BACK BY AND VERIFIED WITH: K CAMPBELL,RN 1516 08/02/2014 WBOND Performed at [facility]     Status:  None   Collection Time: 08/02/14  6:00 PM  Result Value Ref Range   Prothrombin Time 13.8 11.6 - 15.2 seconds   INR 1.04 0.00 - 1.49  TSH     Status: None   Collection Time: 08/02/14  6:00 PM  Result Value Ref Range   TSH 0.437 0.350 - 4.500 uIU/mL  Basic metabolic panel     Status: Abnormal   Collection Time: 08/03/14  5:40 AM  Result Value Ref Range   Sodium 143 135 - 145 mmol/L   Potassium 3.9 3.5 - 5.1 mmol/L   Chloride 110 101 - 111 mmol/L   CO2 26 22 - 32 mmol/L   Glucose, Bld 109 (H) 65 - 99 mg/dL   BUN 6 6 - 20 mg/dL   Creatinine, Ser 0.65 0.44 - 1.00 mg/dL   Calcium 8.3 (L) 8.9 - 10.3 mg/dL   GFR calc non Af Amer >60 >60 mL/min   GFR calc Af Amer >60 >60 mL/min    Comment: (NOTE) The eGFR has been calculated using the CKD EPI equation. This calculation has not been validated in all clinical situations. eGFR's persistently <60 mL/min signify possible Chronic Kidney Disease.    Anion gap 7 5 - 15  CBC     Status: Abnormal   Collection Time: 08/03/14  5:40 AM  Result Value Ref Range   WBC 6.8 4.0 - 10.5 K/uL   RBC 3.75 (L) 3.87 - 5.11 MIL/uL   Hemoglobin 12.7 12.0 - 15.0 g/dL   HCT 40.5 36.0 - 46.0 %   MCV 108.0 (H) 78.0 - 100.0 fL   MCH 33.9 26.0 - 34.0 pg   MCHC 31.4  30.0 - 36.0 g/dL   RDW 13.9 11.5 - 15.5 %   Platelets 249 150 - 400 K/uL  Carbamazepine level, total     Status: None   Collection Time: 08/03/14  5:40 AM  Result Value Ref Range   Carbamazepine Lvl 10.5 4.0 - 12.0 ug/mL    Comment: Performed at [facility]: Blood pressure 161/75, pulse 72, temperature 98.5 F (36.9 C), temperature source Oral, resp. rate 18, SpO2 96 %.  Risk to Self: Is patient at risk for suicide?: Yes Risk to Others:   Prior Inpatient Therapy:   Prior Outpatient Therapy:    Current Facility-Administered Medications  Medication Dose Route Frequency Provider Last Rate Last Dose  . 0.9 %  sodium chloride infusion   Intravenous Continuous Verlee Monte, MD  100 mL/hr at 08/02/14 1737    . acetaminophen (TYLENOL) tablet 650 mg  650 mg Oral Q6H PRN Verlee Monte, MD   650 mg at 08/03/14 0549   Or  . acetaminophen (TYLENOL) suppository 650 mg  650 mg Rectal Q6H PRN Verlee Monte, MD   650 mg at 08/03/14 0845  . albuterol (PROVENTIL) (2.5 MG/3ML) 0.083% nebulizer solution 2.5 mg  2.5 mg Inhalation Q6H PRN Verlee Monte, MD      . heparin injection 5,000 Units  5,000 Units Subcutaneous 3 times per day Verlee Monte, MD   5,000 Units at 08/02/14 2200  . methocarbamol (ROBAXIN) tablet 500 mg  500 mg Oral Q8H PRN Verlee Monte, MD   500 mg at 08/03/14 0549  . ondansetron (ZOFRAN) tablet 4 mg  4 mg Oral Q6H PRN Verlee Monte, MD   4 mg at 08/02/14 1820   Or  . ondansetron (ZOFRAN) injection 4 mg  4 mg Intravenous Q6H PRN Verlee Monte, MD   4 mg at 08/03/14 0845  . sodium chloride 0.9 % injection 3 mL  3 mL Intravenous Q12H Verlee Monte, MD   3 mL at 08/02/14 2200    Musculoskeletal: Strength & Muscle Tone: decreased Gait & Station: unable to stand Patient leans: N/A  Psychiatric Specialty Exam: Physical Exam as per history and physical   ROS generalized body pains, dizziness, nausea, abdominal pain and back pain. Patient also reportedly depressed and suicidal, status post suicidal intentional overdose of carbamazepine No Fever-chills, No Headache, No changes with Vision or hearing, reports vertigo No problems swallowing food or Liquids, No Chest pain, Cough or Shortness of Breath, No Abdominal pain, No Nausea or Vommitting, Bowel movements are regular, No Blood in stool or Urine, No dysuria, No new skin rashes or bruises, No new joints pains-aches,  No new weakness, tingling, numbness in any extremity, No recent weight gain or loss, No polyuria, polydypsia or polyphagia,   A full 10 point Review of Systems was done, except as stated above, all other Review of Systems were negative.  Blood pressure 161/75, pulse 72, temperature 98.5 F (36.9 C),  temperature source Oral, resp. rate 18, SpO2 96 %.There is no weight on file to calculate BMI.  General Appearance: Guarded  Eye Contact::  Good  Speech:  Clear and Coherent  Volume:  Decreased  Mood:  Anxious, Depressed, Hopeless and Worthless  Affect:  Constricted and Depressed  Thought Process:  Coherent and Goal Directed  Orientation:  Full (Time, Place, and Person)  Thought Content:  Rumination  Suicidal Thoughts:  Yes.  with intent/plan  Homicidal Thoughts:  No  Memory:  Immediate;   Fair Recent;   Fair  Judgement:  Impaired  Insight:  Fair  Psychomotor Activity:  Decreased  Concentration:  Fair  Recall:  [place] of Knowledge:Good  Language: Good  Akathisia:  Negative  Handed:  Right  AIMS (if indicated):     Assets:  Communication Skills Desire for Improvement Leisure Time Physical Health Resilience Social Support Talents/Skills  ADL's:  Impaired  Cognition: WNL  Sleep:      Medical Decision Making: New problem, with additional work up planned, Review of Psycho-Social Stressors (1), Review or order clinical lab tests (1), Established Problem, Worsening (2), Review of Last Therapy Session (1), Review or order medicine tests (1), Review of Medication Regimen & Side Effects (2) and Review of New Medication or Change in Dosage (2)  Treatment Plan Summary: Daily contact with patient to assess and evaluate symptoms and progress in treatment and Medication management  Plan: Suicidal attempt: Safety monitoring Monitor for carbamazepine levels and renal functions Bipolar disorder: Discontinue carbamazepine restart Paxil 40 mg daily for depression, Seroquel XL 300 mg at bedtime for mood swings,, trazodone 100 mg at bedtime for insomnia.  Substance abuse: Counseling for keep her sober, may benefit from the chemical dependency rehabilitation treatment and medically and psychiatrically stable.  Recommend psychiatric Inpatient admission when medically cleared. Supportive  therapy provided about ongoing stressors.   Appreciate psychiatric consultation and follow up as clinically required Please contact [phone] or [phone] if needs further assistance  Disposition: Patient meets criteria for acute psychiatric hospitalization, please refer to the psychiatric social service regarding appropriate inpatient hospitalization.   Marna Weniger,[name] 08/03/2014 12:33 PM

## 2014-08-04 ENCOUNTER — Inpatient Hospital Stay (HOSPITAL_COMMUNITY)
Admission: EM | Admit: 2014-08-04 | Discharge: 2014-08-08 | DRG: 885 | Disposition: A | Discharge status: Home / Self Care | Payer: Medicare Other | Source: Intra-hospital | Attending: Psychiatry | Admitting: Psychiatry

## 2014-08-04 ENCOUNTER — Encounter (HOSPITAL_COMMUNITY): Payer: Self-pay | Admitting: *Deleted

## 2014-08-04 DIAGNOSIS — T1491 Suicide attempt: Secondary | ICD-10-CM | POA: Diagnosis not present

## 2014-08-04 DIAGNOSIS — F1220 Cannabis dependence, uncomplicated: Secondary | ICD-10-CM | POA: Diagnosis present

## 2014-08-04 DIAGNOSIS — F1020 Alcohol dependence, uncomplicated: Secondary | ICD-10-CM | POA: Diagnosis present

## 2014-08-04 DIAGNOSIS — F17210 Nicotine dependence, cigarettes, uncomplicated: Secondary | ICD-10-CM | POA: Diagnosis present

## 2014-08-04 DIAGNOSIS — F419 Anxiety disorder, unspecified: Secondary | ICD-10-CM | POA: Diagnosis present

## 2014-08-04 DIAGNOSIS — F3130 Bipolar disorder, current episode depressed, mild or moderate severity, unspecified: Secondary | ICD-10-CM | POA: Diagnosis not present

## 2014-08-04 DIAGNOSIS — F1420 Cocaine dependence, uncomplicated: Secondary | ICD-10-CM | POA: Diagnosis present

## 2014-08-04 DIAGNOSIS — M797 Fibromyalgia: Secondary | ICD-10-CM | POA: Diagnosis present

## 2014-08-04 DIAGNOSIS — K219 Gastro-esophageal reflux disease without esophagitis: Secondary | ICD-10-CM | POA: Diagnosis present

## 2014-08-04 DIAGNOSIS — I10 Essential (primary) hypertension: Secondary | ICD-10-CM | POA: Diagnosis present

## 2014-08-04 DIAGNOSIS — Z590 Homelessness: Secondary | ICD-10-CM | POA: Diagnosis not present

## 2014-08-04 DIAGNOSIS — T421X2D Poisoning by iminostilbenes, intentional self-harm, subsequent encounter: Secondary | ICD-10-CM | POA: Diagnosis not present

## 2014-08-04 DIAGNOSIS — T421X2A Poisoning by iminostilbenes, intentional self-harm, initial encounter: Secondary | ICD-10-CM | POA: Diagnosis present

## 2014-08-04 DIAGNOSIS — F319 Bipolar disorder, unspecified: Secondary | ICD-10-CM | POA: Diagnosis present

## 2014-08-04 DIAGNOSIS — F1910 Other psychoactive substance abuse, uncomplicated: Secondary | ICD-10-CM

## 2014-08-04 DIAGNOSIS — F329 Major depressive disorder, single episode, unspecified: Secondary | ICD-10-CM | POA: Diagnosis present

## 2014-08-04 LAB — BASIC METABOLIC PANEL
ANION GAP: 9 (ref 5–15)
BUN: 5 mg/dL — ABNORMAL LOW (ref 6–20)
CHLORIDE: 106 mmol/L (ref 101–111)
CO2: 30 mmol/L (ref 22–32)
Calcium: 8.3 mg/dL — ABNORMAL LOW (ref 8.9–10.3)
Creatinine, Ser: 0.6 mg/dL (ref 0.44–1.00)
GFR calc non Af Amer: >60 mL/min (ref >60)
Glucose, Bld: 90 mg/dL (ref 65–99)
POTASSIUM: 3.3 mmol/L — AB (ref 3.5–5.1)
Sodium: 145 mmol/L (ref 135–145)

## 2014-08-04 LAB — HEPATIC FUNCTION PANEL
ALT: 24 U/L (ref 14–54)
AST: 24 U/L (ref 15–41)
Albumin: 2.9 g/dL — ABNORMAL LOW (ref 3.5–5.0)
Alkaline Phosphatase: 64 U/L (ref 38–126)
BILIRUBIN TOTAL: 0.8 mg/dL (ref 0.3–1.2)
Bilirubin, Direct: 0.1 mg/dL (ref 0.1–0.5)
Indirect Bilirubin: 0.7 mg/dL (ref 0.3–0.9)
Total Protein: 5.7 g/dL — ABNORMAL LOW (ref 6.5–8.1)

## 2014-08-04 LAB — CARBAMAZEPINE LEVEL, TOTAL: CARBAMAZEPINE LVL: 3.6 ug/mL — AB (ref 4.0–12.0)

## 2014-08-04 MED ORDER — ACETAMINOPHEN 325 MG PO TABS
650.0000 mg | ORAL_TABLET | Freq: Four times a day (QID) | ORAL | Status: DC | PRN
  Administered 2014-08-05 – 2014-08-08 (×5): 650 mg via ORAL
  Filled 2014-08-04 (×7): qty 2

## 2014-08-04 MED ORDER — LORAZEPAM 1 MG PO TABS
1.0000 mg | ORAL_TABLET | Freq: Two times a day (BID) | ORAL | Status: DC | PRN
  Administered 2014-08-04 – 2014-08-08 (×6): 1 mg via ORAL
  Filled 2014-08-04 (×6): qty 1

## 2014-08-04 MED ORDER — SUMATRIPTAN SUCCINATE 50 MG PO TABS
50.0000 mg | ORAL_TABLET | Freq: Once | ORAL | Status: AC
  Administered 2014-08-04: 50 mg via ORAL
  Filled 2014-08-04 (×2): qty 1

## 2014-08-04 MED ORDER — PAROXETINE HCL 20 MG PO TABS
40.0000 mg | ORAL_TABLET | Freq: Every day | ORAL | Status: DC
  Administered 2014-08-05 – 2014-08-08 (×4): 40 mg via ORAL
  Filled 2014-08-04 (×4): qty 2
  Filled 2014-08-04: qty 6
  Filled 2014-08-04: qty 2

## 2014-08-04 MED ORDER — QUETIAPINE FUMARATE ER 300 MG PO TB24
300.0000 mg | ORAL_TABLET | Freq: Every day | ORAL | Status: DC
  Administered 2014-08-04 – 2014-08-07 (×4): 300 mg via ORAL
  Filled 2014-08-04 (×4): qty 1
  Filled 2014-08-04: qty 3
  Filled 2014-08-04 (×2): qty 1

## 2014-08-04 MED ORDER — ALPRAZOLAM 1 MG PO TABS
1.0000 mg | ORAL_TABLET | Freq: Three times a day (TID) | ORAL | Status: DC | PRN
  Administered 2014-08-04: 1 mg via ORAL
  Filled 2014-08-04: qty 1

## 2014-08-04 MED ORDER — POTASSIUM CHLORIDE CRYS ER 20 MEQ PO TBCR
40.0000 meq | EXTENDED_RELEASE_TABLET | Freq: Once | ORAL | Status: AC
  Administered 2014-08-04: 40 meq via ORAL
  Filled 2014-08-04: qty 2

## 2014-08-04 MED ORDER — METHOCARBAMOL 500 MG PO TABS
500.0000 mg | ORAL_TABLET | Freq: Three times a day (TID) | ORAL | Status: DC | PRN
  Administered 2014-08-04: 500 mg via ORAL
  Filled 2014-08-04: qty 1

## 2014-08-04 MED ORDER — ENSURE ENLIVE PO LIQD
237.0000 mL | Freq: Two times a day (BID) | ORAL | Status: DC
  Administered 2014-08-05 – 2014-08-08 (×7): 237 mL via ORAL

## 2014-08-04 MED ORDER — METHOCARBAMOL 500 MG PO TABS: 500.0000 mg | ORAL_TABLET | Freq: Three times a day (TID) | ORAL | Status: DC | PRN

## 2014-08-04 MED ORDER — ALBUTEROL SULFATE HFA 108 (90 BASE) MCG/ACT IN AERS
1.0000 | INHALATION_SPRAY | Freq: Four times a day (QID) | RESPIRATORY_TRACT | Status: DC | PRN
  Filled 2014-08-04: qty 6.7

## 2014-08-04 MED ORDER — ALUM & MAG HYDROXIDE-SIMETH 200-200-20 MG/5ML PO SUSP: 30.0000 mL | ORAL | Status: DC | PRN

## 2014-08-04 MED ORDER — METHOCARBAMOL 1000 MG/10ML IJ SOLN
500.0000 mg | Freq: Three times a day (TID) | INTRAVENOUS | Status: DC | PRN
  Filled 2014-08-04: qty 5

## 2014-08-04 MED ORDER — MAGNESIUM HYDROXIDE 400 MG/5ML PO SUSP: 30.0000 mL | Freq: Every day | ORAL | Status: DC | PRN

## 2014-08-04 MED ORDER — TRAZODONE HCL 100 MG PO TABS
100.0000 mg | ORAL_TABLET | Freq: Every evening | ORAL | Status: DC | PRN
  Administered 2014-08-04 – 2014-08-07 (×3): 100 mg via ORAL
  Filled 2014-08-04 (×2): qty 1
  Filled 2014-08-04: qty 3
  Filled 2014-08-04: qty 1

## 2014-08-04 NOTE — Progress Notes (Signed)
Admission note: Pt admitted to room 305/2. Denies SI at admission. Valuables locked in locker # 4, including home medications (Ventolin inhaler, proair inhaler, carbamazepine 200mg  bottle with pills, paxil 40 mg bottle with pills.) Skin assessment completed. Admission paper work signed. Patient irritable at admission. "Im not interested, I am leaving here tomorrow." Patient oriented to unit. Clothes in laundry per patient request. Will monitor on special checks q 15 mins for safety.

## 2014-08-04 NOTE — Tx Team (Signed)
Initial Interdisciplinary Treatment Plan   PATIENT STRESSORS: Financial difficulties Marital or family conflict Substance abuse   PATIENT STRENGTHS: Average or above average intelligence Motivation for treatment/growth   PROBLEM LIST: Problem List/Patient Goals Date to be addressed Date deferred Reason deferred Estimated date of resolution  Depression 08/04/14     Suicidal ideatin 08/04/14     "I want better ways to cope"                                           DISCHARGE CRITERIA:  Adequate post-discharge living arrangements Improved stabilization in mood, thinking, and/or behavior Motivation to continue treatment in a less acute level of care Need for constant or close observation no longer present Verbal commitment to aftercare and medication compliance  PRELIMINARY DISCHARGE PLAN: Attend 12-step recovery group Outpatient therapy Placement in alternative living arrangements  PATIENT/FAMIILY INVOLVEMENT: This treatment plan has been presented to and reviewed with the patient, ZANYIA NEMECHEK.  The patient and family have been given the opportunity to ask questions and make suggestions.  Juliann Pares 08/04/2014, 10:25 PM

## 2014-08-04 NOTE — Progress Notes (Signed)
Disposition CSW contacted nursing staff to inform that patient has been accepted to room 305-2 at [facility].  Patient's accepting provider: Dr. Elsie Saas and patient's Attending MD will be Dr. Dub Mikes.  CSW faxed over voluntary consent form to nursing staff which in turn had patient sign and fax back to [facility].  At this time we are waiting on the Attending to complete discharge orders and then nursing staff will send patient by [facility] to [facility].  AC at [facility] is requesting patient be transferred by 1830.  Seward Speck [facility] Disposition CSW [phone]

## 2014-08-04 NOTE — Progress Notes (Signed)
Pt just left with [facility] headed to [facility]

## 2014-08-04 NOTE — Clinical Social Work Note (Signed)
CSW received a call from psychiatrist inquiring if an in patient psychiatric bed could be found today for this pt  CSW reviewed pt chart which reflected there was no CSW consult on this pt so pt will not have a bed today  CSW will refer to week day CSW to follow up with referrals for in patient treatment  Psychiatrist stated he will follow up with week day CSW  .Elray Buba, LCSW [facility] Clinical Social Worker - Weekend Coverage cell #: [phone]

## 2014-08-04 NOTE — Progress Notes (Signed)
Patient did not attend the evening speaker AA meeting. Pt was newly admitted to unit and remained in bed sleeping during the meeting.

## 2014-08-04 NOTE — Progress Notes (Signed)
Writer gave report to receiving nurse, Marisue Ivan, at [facility].

## 2014-08-04 NOTE — Progress Notes (Signed)
PROGRESS NOTE    KATURAH SULEK QMG:[id number] DOB: [birth year] DOA: 08/02/2014 PCP: Billee Cashing, MD  Primary Psychiatrist: Dr. Omelia Blackwater  HPI/Brief narrative 49 year old female patient with history of bipolar disorder, HTN, remote seizure (none over the last 10 years and not on medications for this), GERD, fibromyalgia and chronic pain, tobacco abuse, admitted to [facility] on 08/02/14 after she overdosed on 25 pills of her Tegretol and presented with nausea and dizziness. In the ED Tegretol level was 23.2. Alcohol level was undetectable. Salicylate and acetaminophen level were okay. UDS positive for benzodiazepines, cocaine and THC.   Assessment/Plan:  Intentional carbamazepine overdose/suicide attempt Reported that she intentionally took 25 pills of her carbamazepine because she was mad at her daughter. Reported she wanted to hurt herself. No current suicidal ideations Poison control was consulted. They recommended monitoring serial carbamazepine levels which have since normalized (23.2 > 10.5 > 3.6). They have closed her case. Tegretol was discontinued-patient states that she was taking this for her mood disorder and not for seizures.  Bipolar disorder Psychiatry input appreciated Carbamazepine discontinued. Restarted Paxil 40 mg daily for depression, Seroquel XL 300 MG at bedtime for mood swings and trazodone 100 MG at bedtime for insomnia. EKG 6/18: QTC 408 ms and no acute changes. Will resume Xanax which she states that she was taking up to 2-3 times daily.  Polysubstance abuse UDS is positive for THC, cocaine and benzodiazepine use. Although her urine is positive she denies any recent use for all 3 substances. Has history of alcohol abuse, undetectable alcohol level. No overt alcohol withdrawal signs or symptoms  Seizures -States no seizures in the last 10 years and repeatedly indicates that she was not on Tegretol for this indication. -She states that she does  not drive.   Leg spasms Normal potassium, calcium is lowish but not that low. started on Robaxin-apparently has been taking it at home. This is appears to be benign spasm, similar to what she had before. No further workup.  Hypokalemia - Replace  Macrocytosis - Likely secondary to alcohol abuse. Folate: 16 and B12: 511   DVT prophylaxis: Subcutaneous heparin Code Status: Full Code Family Communication: None at bedside Disposition Plan: Awaiting psychiatry follow-up. Patient medically stable for discharge to inpatient psych. Discussed with weekend social worker-will not be able to discharge to [facility] today.   Consultants:  Psych  Procedures:  None  Antibiotics:  None    Subjective: Denies suicidal ideations. Complains of leg cramps and states that she takes Robaxin for same. Also indicates that she takes Xanax up to 2-3 times daily when necessary. Not forthcoming regarding amount of alcohol use-states that she drinks a 40 ounce beer sometimes.  Objective: Filed Vitals:   08/03/14 0540 08/03/14 1500 08/03/14 2218 08/04/14 0703  BP: 161/75 156/89 129/70 150/91  Pulse: 72 62 54 56  Temp: 98.5 F (36.9 C) 98.9 F (37.2 C) 98 F (36.7 C) 98.7 F (37.1 C)  TempSrc: Oral Oral Oral Oral  Resp: 18 18 18 18   SpO2: 96% 96% 98% 97%    Intake/Output Summary (Last 24 hours) at 08/04/14 1505 Last data filed at 08/04/14 1053  Gross per 24 hour  Intake   1440 ml  Output      0 ml  Net   1440 ml   There were no vitals filed for this visit.   Exam:  General exam: Middle-aged pleasant female sitting up comfortably in bed. Respiratory system: Clear. No increased work of breathing. Cardiovascular system:  S1 & S2 heard, RRR. No JVD, murmurs, gallops, clicks or pedal edema. Telemetry: Mostly sinus rhythm. Occasional sinus bradycardia in the mid to high 50s. Gastrointestinal system: Abdomen is nondistended, soft and nontender. Normal bowel sounds heard. Central nervous system:  Alert and oriented. No focal neurological deficits. Extremities: Symmetric 5 x 5 power. Psychiatry: Interactive. Pleasant and appropriate affect.   Data Reviewed: Basic Metabolic Panel:  Recent Labs Lab 08/02/14 1136 08/03/14 0540 08/04/14 0539  NA 144 143 145  K 4.4 3.9 3.3*  CL 108 110 106  CO2 GLUCOSE 127* 109* 90  BUN 8 6 PENDING  CREATININE 0.65 0.65 0.60  CALCIUM 8.5* 8.3* 8.3*   Liver Function Tests:  Recent Labs Lab 08/02/14 1136 08/04/14 0539  AST 44* 24  ALT 36 24  ALKPHOS 93 64  BILITOT 0.3 0.8  PROT 7.3 5.7*  ALBUMIN 3.9 2.9*   No results for input(s): LIPASE, AMYLASE in the last 168 hours. No results for input(s): AMMONIA in the last 168 hours. CBC:  Recent Labs Lab 08/02/14 1136 08/03/14 0540  WBC 4.1 6.8  HGB 12.9 12.7  HCT 40.8 40.5  MCV 106.5* 108.0*  PLT 285 249   Cardiac Enzymes: No results for input(s): CKTOTAL, CKMB, CKMBINDEX, TROPONINI in the last 168 hours. BNP (last 3 results) No results for input(s): PROBNP in the last 8760 hours. CBG: No results for input(s): GLUCAP in the last 168 hours.  No results found for this or any previous visit (from the past 240 hour(s)).         Studies: Ct Head Wo Contrast  08/03/2014   CLINICAL DATA:  Recent substance abuse and overdose. Patient complains of dizziness and cramping.  EXAM: CT HEAD WITHOUT CONTRAST  TECHNIQUE: Contiguous axial images were obtained from the base of the skull through the vertex without contrast.  COMPARISON:  04/06/2013  FINDINGS: Normal appearance of the intracranial structures. No evidence for acute hemorrhage, mass lesion, midline shift, hydrocephalus or large infarct. No acute bony abnormality. The visualized sinuses are clear.  IMPRESSION: Negative head CT.   Electronically Signed   By: Richarda Overlie M.D.   On: 08/03/2014 11:17        Scheduled Meds: . heparin  5,000 Units Subcutaneous 3 times per day  . PARoxetine  40 mg Oral Daily  .  QUEtiapine  300 mg Oral QHS  . sodium chloride  3 mL Intravenous Q12H  . traZODone  100 mg Oral QHS   Continuous Infusions:    Principal Problem:   Intentional carbamazepine overdose Active Problems:   Cannabis abuse with cannabis-induced disorder   Substance induced mood disorder   Alcohol use disorder, severe, dependence   Cocaine use disorder, severe, dependence   Seizures    Time spent: 25 minutes.    Marcellus Scott, MD, FACP, FHM. [facility] Pager [phone]  If 7PM-7AM, please contact night-coverage [email or web address] Password [email or web address] 08/04/2014, 3:05 PM

## 2014-08-04 NOTE — Discharge Summary (Signed)
Physician Discharge Summary  [name] ZOX:[id number] DOB: [birth year] DOA: 08/02/2014  PCP: Billee Cashing, MD  Primary Psychiatrist: Dr. Omelia Blackwater  Admit date: 08/02/2014 Discharge date: 08/04/2014  Time spent: Less than 30 minutes  Recommendations for Outpatient Follow-up:  1. Patient being discharged from [facility] and will be transferred to [facility] for ongoing inpatient psychiatric care. 2. Dr. Billee Cashing, PCP upon discharge from [facility]  Discharge Diagnoses:  Principal Problem:   Intentional carbamazepine overdose Active Problems:   Cannabis abuse with cannabis-induced disorder   Substance induced mood disorder   Alcohol use disorder, severe, dependence   Cocaine use disorder, severe, dependence   Seizures   Discharge Condition: Improved & Stable  Diet recommendation: Heart healthy diet.  There were no vitals filed for this visit.  History of present illness:  49 year old female patient with history of bipolar disorder, HTN managed without medications, remote seizure (none over the last 10 years and not on medications for this), GERD, fibromyalgia and chronic pain, tobacco abuse, admitted to [facility] on 08/02/14 after she overdosed on 25 pills of her Tegretol (200 mg each) and presented with nausea and dizziness. In the ED Tegretol level was 23.2. Alcohol level was <5. Salicylate and acetaminophen level were okay. UDS positive for benzodiazepines, cocaine and THC.  Hospital Course:   Intentional carbamazepine overdose/suicide attempt Patient reported that she intentionally took 25 pills of her carbamazepine because she was mad at her daughter. Reported she wanted to hurt herself. No current suicidal ideations Poison control was consulted. They recommended monitoring serial carbamazepine levels which have since normalized (23.2 > 10.5 > 3.6). They have no further recommendations and have closed her case-discussed with them  today. Tegretol was discontinued-patient states that she was taking this for her mood disorder/bipolar disorder and not for seizures. Psychiatry recommended discontinuing Tegretol.  Bipolar disorder Psychiatry input appreciated Carbamazepine discontinued. Restarted Paxil 40 mg daily for depression, Seroquel XL 300 MG at bedtime for mood swings and trazodone 100 MG at bedtime for insomnia. EKG 6/18: QTC 408 ms and no acute changes. Will resume Xanax which she states that she was taking up to 2-3 times daily as needed.  Polysubstance abuse UDS is positive for THC, cocaine and benzodiazepine use. Although her urine is positive she denies any recent use for all 3 substances. Has history of alcohol abuse, undetectable alcohol level. No overt alcohol withdrawal signs or symptoms  Seizures -States no seizures in the last 10 years and repeatedly indicates that she was not on Tegretol for this indication. -She states that she does not drive.   Leg spasms Normal potassium, calcium is lowish but not that low (corrected calcium normal). started on Robaxin-apparently has been taking it at home. This appears to be benign spasm, similar to what she had before. No further workup.  Hypokalemia - Replaced today  Macrocytosis - Likely secondary to alcohol abuse. Folate: 16 and B12: 511   Consultants:  Psych  Procedures:  None  Antibiotics:  None   Discharge Exam:  Complaints: Denies suicidal ideations. Complains of leg cramps and states that she takes Robaxin for same. Also indicates that she takes Xanax up to 2-3 times daily when necessary. Not forthcoming regarding amount of alcohol use-states that she drinks a 40 ounce beer sometimes.  Filed Vitals:   08/03/14 1500 08/03/14 2218 08/04/14 0703 08/04/14 1400  BP: 156/89 129/70 150/91 142/59  Pulse: 62 54 56 61  Temp: 98.9 F (37.2 C) 98 F (36.7 C)  98.7 F (37.1 C) 97.3 F (36.3 C)  TempSrc: Oral Oral Oral Oral  Resp: SpO2: 96% 98% 97% 99%    General exam: Middle-aged pleasant female sitting up comfortably in bed. Respiratory system: Clear. No increased work of breathing. Cardiovascular system: S1 & S2 heard, RRR. No JVD, murmurs, gallops, clicks or pedal edema. Telemetry: Mostly sinus rhythm. Occasional sinus bradycardia in the mid to high 50s. Gastrointestinal system: Abdomen is nondistended, soft and nontender. Normal bowel sounds heard. Central nervous system: Alert and oriented. No focal neurological deficits. Extremities: Symmetric 5 x 5 power. Psychiatry: Interactive. Pleasant and appropriate affect.  Discharge Instructions      Discharge Instructions    Call MD for:  persistant nausea and vomiting    Complete by:  As directed      Call MD for:  severe uncontrolled pain    Complete by:  As directed      Diet - low sodium heart healthy    Complete by:  As directed      Increase activity slowly    Complete by:  As directed             Medication List    STOP taking these medications        carbamazepine 200 MG tablet  Commonly known as:  TEGRETOL      TAKE these medications        albuterol 108 (90 BASE) MCG/ACT inhaler  Commonly known as:  PROVENTIL HFA;VENTOLIN HFA  Inhale 1-2 puffs into the lungs every 6 (six) hours as needed for wheezing or shortness of breath.     ALPRAZolam 1 MG tablet  Commonly known as:  XANAX  Take 1 mg by mouth 3 (three) times daily as needed for anxiety (anxiety).     methocarbamol 500 MG tablet  Commonly known as:  ROBAXIN  Take 1 tablet (500 mg total) by mouth every 8 (eight) hours as needed for muscle spasms.     PARoxetine 40 MG tablet  Commonly known as:  PAXIL  Take 1 tablet (40 mg total) by mouth daily.     SEROQUEL XR 300 MG 24 hr tablet  Generic drug:  QUEtiapine  Take 300 mg by mouth at bedtime.     traZODone 100 MG tablet  Commonly known as:  DESYREL  Take 100 mg by mouth at bedtime.          The results of  significant diagnostics from this hospitalization (including imaging, microbiology, ancillary and laboratory) are listed below for reference.    Significant Diagnostic Studies: Dg Chest 2 View  07/21/2014   CLINICAL DATA:  Nonproductive cough, wheezing and shortness of Breath.  EXAM: CHEST  2 VIEW  COMPARISON:  11/04/2012  FINDINGS: The cardiac silhouette, mediastinal and hilar contours are within normal limits and stable. There are mild bronchitic changes and suspected emphysema, likely related to smoking. No infiltrates, edema or effusions. The bony thorax is intact.  IMPRESSION: Mild chronic bronchitic changes but no acute pulmonary findings.   Electronically Signed   By: Rudie Meyer M.D.   On: 07/21/2014 18:49   Ct Head Wo Contrast  08/03/2014   CLINICAL DATA:  Recent substance abuse and overdose. Patient complains of dizziness and cramping.  EXAM: CT HEAD WITHOUT CONTRAST  TECHNIQUE: Contiguous axial images were obtained from the base of the skull through the vertex without contrast.  COMPARISON:  04/06/2013  FINDINGS: Normal appearance of the intracranial structures. No evidence  for acute hemorrhage, mass lesion, midline shift, hydrocephalus or large infarct. No acute bony abnormality. The visualized sinuses are clear.  IMPRESSION: Negative head CT.   Electronically Signed   By: Richarda Overlie M.D.   On: 08/03/2014 11:17    Microbiology: No results found for this or any previous visit (from the past 240 hour(s)).   Labs: Basic Metabolic Panel:  Recent Labs Lab 08/02/14 1136 08/03/14 0540 08/04/14 0539  NA 144 143 145  K 4.4 3.9 3.3*  CL 108 110 106  CO2 25 26 30   GLUCOSE 127* 109* 90  BUN 8 6 5*  CREATININE 0.65 0.65 0.60  CALCIUM 8.5* 8.3* 8.3*   Liver Function Tests:  Recent Labs Lab 08/02/14 1136 08/04/14 0539  AST 44* 24  ALT 36 24  ALKPHOS 93 64  BILITOT 0.3 0.8  PROT 7.3 5.7*  ALBUMIN 3.9 2.9*   No results for input(s): LIPASE, AMYLASE in the last 168 hours. No  results for input(s): AMMONIA in the last 168 hours. CBC:  Recent Labs Lab 08/02/14 1136 08/03/14 0540  WBC 4.1 6.8  HGB 12.9 12.7  HCT 40.8 40.5  MCV 106.5* 108.0*  PLT 285 249   Cardiac Enzymes: No results for input(s): CKTOTAL, CKMB, CKMBINDEX, TROPONINI in the last 168 hours. BNP: BNP (last 3 results) No results for input(s): BNP in the last 8760 hours.  ProBNP (last 3 results) No results for input(s): PROBNP in the last 8760 hours.  CBG: No results for input(s): GLUCAP in the last 168 hours.   Additional labs: 1. INR: 1.04 2. Serum carbamazepine level (23.2 > 10.5 > 3.6) 3. Serum salicylate level <4 and serum acetaminophen level <10. 4. TSH: 0.437 5. UDS: Positive for benzodiazepines, cocaine and THC 6. Blood alcohol level <5   Signed:  Rasheem Figiel, MD, FACP, FHM. [facility] Pager [phone]  If 7PM-7AM, please contact night-coverage [email or web address] Password [facility] 08/04/2014, 4:23 PM

## 2014-08-04 NOTE — Progress Notes (Signed)
Pt called [facility] to give report. Receiving nurse busy -- will call back.

## 2014-08-04 NOTE — Progress Notes (Signed)
PT Cancellation Note  Patient Details Name: [name] MRN: [id number] DOB: 1965/03/05   Cancelled Treatment:    Reason Eval/Treat Not Completed: PT screened, no needs identified, will sign off   [facility] 08/04/2014, 3:48 PM

## 2014-08-05 DIAGNOSIS — T1491 Suicide attempt: Secondary | ICD-10-CM

## 2014-08-05 DIAGNOSIS — F3130 Bipolar disorder, current episode depressed, mild or moderate severity, unspecified: Secondary | ICD-10-CM

## 2014-08-05 DIAGNOSIS — T421X2A Poisoning by iminostilbenes, intentional self-harm, initial encounter: Secondary | ICD-10-CM

## 2014-08-05 LAB — HEMOGLOBIN A1C
Hgb A1c MFr Bld: 5.4 % (ref 4.8–5.6)
Mean Plasma Glucose: 108 mg/dL

## 2014-08-05 MED ORDER — LORAZEPAM 1 MG PO TABS
1.0000 mg | ORAL_TABLET | ORAL | Status: DC | PRN
  Administered 2014-08-05 – 2014-08-06 (×2): 1 mg via ORAL
  Filled 2014-08-05 (×2): qty 1

## 2014-08-05 MED ORDER — LORAZEPAM 2 MG/ML IJ SOLN: 1.0000 mg | INTRAMUSCULAR | Status: DC | PRN

## 2014-08-05 MED ORDER — METHOCARBAMOL 500 MG PO TABS
500.0000 mg | ORAL_TABLET | Freq: Four times a day (QID) | ORAL | Status: AC | PRN
  Administered 2014-08-05 – 2014-08-08 (×7): 500 mg via ORAL
  Filled 2014-08-05 (×7): qty 1

## 2014-08-05 MED ORDER — SUMATRIPTAN SUCCINATE 50 MG PO TABS
50.0000 mg | ORAL_TABLET | ORAL | Status: DC | PRN
  Administered 2014-08-05 – 2014-08-07 (×6): 50 mg via ORAL
  Filled 2014-08-05 (×6): qty 1

## 2014-08-05 MED ORDER — ONDANSETRON 4 MG PO TBDP
4.0000 mg | ORAL_TABLET | Freq: Three times a day (TID) | ORAL | Status: DC | PRN
  Administered 2014-08-05 – 2014-08-06 (×3): 4 mg via ORAL
  Filled 2014-08-05 (×3): qty 1

## 2014-08-05 MED ORDER — KETOROLAC TROMETHAMINE 15 MG/ML IJ SOLN
15.0000 mg | Freq: Once | INTRAMUSCULAR | Status: AC
  Administered 2014-08-05: 15 mg via INTRAMUSCULAR
  Filled 2014-08-05 (×2): qty 1

## 2014-08-05 MED ORDER — POTASSIUM CHLORIDE CRYS ER 20 MEQ PO TBCR
20.0000 meq | EXTENDED_RELEASE_TABLET | Freq: Two times a day (BID) | ORAL | Status: AC
  Administered 2014-08-05 – 2014-08-07 (×4): 20 meq via ORAL
  Filled 2014-08-05 (×8): qty 1

## 2014-08-05 MED ORDER — NICOTINE 21 MG/24HR TD PT24
21.0000 mg | MEDICATED_PATCH | Freq: Every day | TRANSDERMAL | Status: DC
  Administered 2014-08-05 – 2014-08-08 (×4): 21 mg via TRANSDERMAL
  Filled 2014-08-05 (×2): qty 1
  Filled 2014-08-05: qty 14
  Filled 2014-08-05 (×3): qty 1

## 2014-08-05 NOTE — BH Assessment (Signed)
[facility] Post 1:1 Observation Documentation  For the first (8) hours following discontinuation of 1:1 precautions, a progress note entry by nursing staff should be documented at least every 2 hours, reflecting the patient's behavior, condition, mood, and conversation.  Use the progress notes for additional entries.  Time 1:1 discontinued:    Patient's Behavior:  Appropriate  Patient's Condition:  Appropriate   Patient's Conversation: Patient complaint of pain related to IV infiltration.  Writer applied [employer] and notified PA. Spencer assess patient to rule out infection and ordered Toradol for the pain. Patient denied SI/HI and denied Hallucinations.   Roselie Skinner [facility] 08/05/2014, 11:15 PM

## 2014-08-05 NOTE — Progress Notes (Signed)
Patient ID: [name], female   DOB: [birth year], 49 y.o.   MRN: [id number]  1:1 Nursing Note-  Patient is seen sitting in the day room at this time. Patient is not interacting with others but is watching the TV. MHT is within reach and 1:1 is maintained for safety. Patient's respirations are even and unlabored and there is no distress noted. Q15 minute safety checks are maintained as well.

## 2014-08-05 NOTE — [facility] Group Notes (Signed)
[facility] LCSW Group Therapy 08/05/2014 1:15 PM  Type of Therapy: Group Therapy- Emotion Regulation  Pt did not attend, declined invitation.  Chad Cordial, LCSWA 08/05/2014 3:08 PM

## 2014-08-05 NOTE — Plan of Care (Signed)
Problem: Ineffective individual coping Goal: STG: Patient will remain free from self harm Outcome: Progressing Patient remains free from self harm.      
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
## 2014-08-05 NOTE — Progress Notes (Signed)
Patient ID: [name], female   DOB: 1965-11-09, 49 y.o.   MRN: [id number]  1:1 Nursing Note-   Patient is seen laying in bed with MHT within reach when writer entered patient's room. Patient was facing towards the window but her eyes were initially closed. When writer got closer patient's eyes opened and stated that her legs had begun to feel sore and "fidgety." Patient requested Robaxin for muscle spasm however at this time the medication parameters were not met. Writer will administered to patient when available. 1:1 is maintained for fall safety. Q15 minute safety checks are maintained as well.

## 2014-08-05 NOTE — Progress Notes (Signed)
[facility] Post 1:1 Observation Documentation  For the first (8) hours following discontinuation of 1:1 precautions, a progress note entry by nursing staff should be documented at least every 2 hours, reflecting the patient's behavior, condition, mood, and conversation.  Use the progress notes for additional entries.  Time 1:1 discontinued:  1715  Patient's Behavior: Patient is laying in bed sleeping at this time.    Patient's Condition:  No distress noted. Respirations are even and unlabored.  Patient's Conversation:  No conversation at this time.   Marzetta Board E 08/05/2014, 7:38 PM

## 2014-08-05 NOTE — [facility] Suicide Risk Assessment (Addendum)
[facility] Admission Suicide Risk Assessment   Nursing information obtained from:  Patient Demographic factors:  Caucasian, Divorced or widowed, Low socioeconomic status, Unemployed Current Mental Status:  Suicidal ideation indicated by patient, Suicidal ideation indicated by others Loss Factors:   (Relationship stressors) Historical Factors:  Prior suicide attempts, Impulsivity Risk Reduction Factors:  Sense of responsibility to family Total Time spent with patient: 30 minutes Principal Problem: Bipolar I disorder, most recent episode depressed Diagnosis:   Patient Active Problem List   Diagnosis Date Noted  . Seizures [R56.9] 08/03/2014  . Intentional carbamazepine overdose [T42.1X2A] 08/02/2014  . Bipolar I disorder, most recent episode depressed [F31.30]   . Alcohol use disorder, severe, dependence [F10.20]   . Cocaine use disorder, severe, dependence [F14.20]   . Cannabis use disorder, severe, dependence [F12.20]   . History of cluster headache [Z86.69] 10/27/2011  . Migraine headache with aura [G43.109] 10/27/2011  . Lumbago with sciatica of left side [M54.42] 10/27/2011     Continued Clinical Symptoms:  Alcohol Use Disorder Identification Test Final Score (AUDIT): 3 The "Alcohol Use Disorders Identification Test", Guidelines for Use in Primary Care, Second Edition.  World Science writer [facility]). Score between 0-7:  no or low risk or alcohol related problems. Score between 8-15:  moderate risk of alcohol related problems. Score between 16-19:  high risk of alcohol related problems. Score 20 or above:  warrants further diagnostic evaluation for alcohol dependence and treatment.   CLINICAL FACTORS:   Alcohol/Substance Abuse/Dependencies Previous Psychiatric Diagnoses and Treatments   Musculoskeletal: Strength & Muscle Tone: within normal limits Gait & Station: normal Patient leans: N/A  Psychiatric Specialty Exam: Physical Exam  Review of Systems  Constitutional:  Positive for malaise/fatigue.  Neurological: Positive for dizziness and tremors.  Psychiatric/Behavioral: Positive for depression. The patient is nervous/anxious.   All other systems reviewed and are negative.   Blood pressure 132/88, pulse 93, temperature 98.5 F (36.9 C), temperature source Oral, resp. rate 20, height  (1.702 m), weight 61.689 kg (136 lb).Body mass index is 21.3 kg/(m^2).  General Appearance: Fairly Groomed  Patent attorney::  Fair  Speech:  Slow  Volume:  Normal  Mood:  Anxious  Affect:  Congruent  Thought Process:  Linear  Orientation:  Full (Time, Place, and Person)  Thought Content:  Rumination  Suicidal Thoughts:  No  Homicidal Thoughts:  No  Memory:  Immediate;   Fair Recent;   Fair Remote;   Fair  Judgement:  Poor  Insight:  Shallow  Psychomotor Activity:  Decreased  Concentration:  Poor  Recall:  Fair  Fund of Knowledge:Fair  Language: Fair  Akathisia:  No  Handed:  Right  AIMS (if indicated):     Assets:  Desire for Improvement Social Support  Sleep:     Cognition: WNL  ADL's:  Intact     COGNITIVE FEATURES THAT CONTRIBUTE TO RISK:  Polarized thinking and Thought constriction (tunnel vision)    SUICIDE RISK:   Moderate:  Frequent suicidal ideation with limited intensity, and duration, some specificity in terms of plans, no associated intent, good self-control, limited dysphoria/symptomatology, some risk factors present, and identifiable protective factors, including available and accessible social support.  PLAN OF CARE: Patient will benefit from inpatient treatment and stabilization.  Estimated length of stay is 5-7 days.  Reviewed past medical records,treatment plan.  Reviewed DC summary per IM service . Will not restart Tegretol - which she stated was for Bipolar do , and NOT for her hx of seizures. Could readdress  the need for a mood stabilizer tomorrow , if patient is less dizzy, sedated . Will continue Seroquel at the scheduled  dose. Will continue Trazodone 100 mg po qhs for sleep. Will continue Paxil 40 mg po for now, could consider tapering it off, since she states a diagnosis of Bipolar do . However pt at this time wants to her medications as it is right now. Will continue Ativan 1 mg po bid prn for anxiety - pt was on xanax at home. Will also add Ativan 1 mg po q4h prn for seizures - patient's ;ast reported seizure was 10 yrs ago. Start seizure precautions. Labs reviewed - Hypokalemia - she had potassium replacement - could repeat BMP, TSH reviewed, could order lipid panel, Hba1c, Prolactin. She had EKG- QTC - WNL. Will continue to monitor vitals ,medication compliance and treatment side effects while patient is here.  Will monitor for medical issues as well as call consult as needed.  Reviewed labs ,will order as needed.  CSW will start working on disposition.  Patient to participate in therapeutic milieu .       Medical Decision Making:  Review of Psycho-Social Stressors (1), Review or order clinical lab tests (1), Review and summation of old records (2), Review of Last Therapy Session (1), Review of Medication Regimen & Side Effects (2) and Review of New Medication or Change in Dosage (2)  I certify that inpatient services furnished can reasonably be expected to improve the patient's condition.   Mackay Hanauer md 08/05/2014, 1:57 PM

## 2014-08-05 NOTE — Progress Notes (Signed)
Patient ID: [name], female   DOB: [birth year], 49 y.o.   MRN: [id number]  1:1 Nursing Note-  Patient is seen at the nursing station at this time. Writer is within arm's length of patient at this time due to MHT [place] preparing 1:1 note documentation sheets. Patient reports that she feels dizziness intermittently but wants to take a shower and feels unsafe at this time in regards to falling. Patient received PRN medication for a headache, back pain, and nausea. Respirations are even and unlabored. Q15 minute safety checks are maintained and 1:1 is initiated.

## 2014-08-05 NOTE — Progress Notes (Signed)
Recreation Therapy Notes  Date: 06.20.16 Time: 9:30 am Location: [address] Group Room  Group Topic: Stress Management  Goal Area(s) Addresses:  Patient will verbalize importance of using healthy stress management.  Patient will identify positive emotions associated with healthy stress management.   Intervention: Stress Management  Activity :  Guided Imagery.  LRT introduced and educated patients on stress management technique of guided imagery.  A script was used to deliver the technique to patients.  Patients were asked to follow script read a loud by LRT to engage in practicing the stress management technique.  Education:  Stress Management, Discharge Planning.   Clinical Observations/Feedback: Patient did not attend group.   Ivett Luebbe, LRT/CTRS         Christain Mcraney A 08/05/2014 4:19 PM 

## 2014-08-05 NOTE — Progress Notes (Signed)
[facility] Post 1:1 Observation Documentation  For the first (8) hours following discontinuation of 1:1 precautions, a progress note entry by nursing staff should be documented at least every 2 hours, reflecting the patient's behavior, condition, mood, and conversation.  Use the progress notes for additional entries.  Time 1:1 discontinued:   5:15 PM Patient's Behavior:  Appropriate  Patient's Condition:  Appropriate  Patient's Conversation:  Patient received Toradol as ordered for pain and she is resting quietly now with eyes closed. No distress noted.  Roselie Skinner [facility] 08/05/2014, 11:21 PM

## 2014-08-05 NOTE — [facility] Suicide Risk Assessment (Signed)
[facility] INPATIENT:  Family/Significant Other Suicide Prevention Education  Suicide Prevention Education:  Patient Refusal for Family/Significant Other Suicide Prevention Education: The patient [name] has refused to provide written consent for family/significant other to be provided Family/Significant Other Suicide Prevention Education during admission and/or prior to discharge.  Physician notified.  Elaina Hoops 08/05/2014, 5:01 PM

## 2014-08-05 NOTE — Progress Notes (Signed)
Patient ID: [name], female   DOB: February 24, 1965, 49 y.o.   MRN: [id number] PER STATE REGULATIONS 482.30  THIS CHART WAS REVIEWED FOR MEDICAL NECESSITY WITH RESPECT TO THE PATIENT'S ADMISSION/DURATION OF STAY.  NEXT REVIEW DATE: 08/08/14  Loura Halt, RN, BSN CASE MANAGER

## 2014-08-05 NOTE — Progress Notes (Signed)
[facility] Post 1:1 Observation Documentation  For the first (8) hours following discontinuation of 1:1 precautions, a progress note entry by nursing staff should be documented at least every 2 hours, reflecting the patient's behavior, condition, mood, and conversation.  Use the progress notes for additional entries.  Time 1:1 discontinued:  1715  Patient's Behavior:  Patient calm at this time and in no distress. Patient is cracking jokes and acting appropriately.   Patient's Condition:  Respirations are even and unlabored.  Patient's Conversation:  "I am fine." Patient is speaking on her gait which is steady. Patient is reporting pain in her left arm she states is related to her IV site that the IV was removed from prior to admission.   Marzetta Board E 08/05/2014, 5:18 PM

## 2014-08-05 NOTE — H&P (Signed)
Psychiatric Admission Assessment Adult  Patient Identification: [name] MRN:  [id number] Date of Evaluation:  08/05/2014 Chief Complaint:  "I tried to kill myself by overdosing on Tegretol."  Principal Diagnosis: Bipolar I disorder, most recent episode depressed Diagnosis:   Patient Active Problem List   Diagnosis Date Noted  . MDD (major depressive disorder) [F32.2] 08/04/2014  . Seizures [R56.9] 08/03/2014  . Intentional carbamazepine overdose [T42.1X2A] 08/02/2014  . Bipolar I disorder, most recent episode depressed [F31.30]   . Alcohol use disorder, severe, dependence [F10.20]   . Cocaine use disorder, severe, dependence [F14.20]   . Cannabis use disorder, severe, dependence [F12.20]   . Substance induced mood disorder [F19.94] 12/31/2013  . History of cluster headache [Z86.69] 10/27/2011  . Migraine headache with aura [G43.109] 10/27/2011  . Lumbago with sciatica of left side [M54.42] 10/27/2011  . Alcohol abuse [F10.10] 10/23/2011  . Cannabis abuse with cannabis-induced disorder [F12.19] 10/23/2011  . Bipolar affective disorder [F31.9] 10/23/2011   History of Present Illness::  [name] is a 49 year old female who was taken to [facility] via EMS after an intentional overdose on Tegretol. The patient was reported to be homeless and became angry after her daughter refused to allow her to stay with her. [name] was reported to have taken around twenty five tablets. She received medical treatment following the overdose on [place] unit for hydration and monitoring for QRS prolongation. Today the patient is still experiencing physical problems from the overdose to include nausea, unsteady gait, dizziness, and reported body aches. Patient stated the following "I poisoned myself four days ago. I am not wanting to hurt myself now. I have overdosed before but never this serious. I was aggravated by several stressors. I was mad with my daughter and a friend  was aggravating me. The overdose was very spur of the moment. I am so miserable. The Tegretol I took is really affecting my system." [name] was cooperative with her assessment but is clearly struggling at this time to complete her ADL's. Patient is reporting dizziness, nausea, and headache as continued reactions to the overdose. Her low potassium has been supplemented and her most recent tegretol level was 3.6. There were no detectable alcohol, acetaminophen and salicylate levels. Her UDS is positive for benzos, cocaine, and marijuana. The patient denies using any cocaine recently. [name] appears depressed throughout the assessment and ruminates about how her suicide attempt is still affecting her. She indicates a desire to live appearing to regret her recent actions. Denies any symptoms of psychosis and is not acutely manic.   Elements:  Location: Bipolar depression, substance abuse. Quality: Poor. Severity: Status post intentional overdose as a suicide attempt. Timing: Conflict with the daughter and no place to live. Duration: Few weeks. Context: Psychosocial stresses, polysubstance abuse.  Associated Signs/Symptoms: Depression Symptoms:  depressed mood, anhedonia, psychomotor retardation, feelings of worthlessness/guilt, hopelessness, recurrent thoughts of death, suicidal attempt, anxiety, loss of energy/fatigue, weight loss, decreased appetite, (Hypo) Manic Symptoms:  Denies currently but reports multiple manic episodes in the past  Anxiety Symptoms:  Excessive Worry, Psychotic Symptoms:  Denies PTSD Symptoms: Negative Total Time spent with patient: 1 hour  Past Medical History:  Past Medical History  Diagnosis Date  . Bipolar 1 disorder   . Schizophrenia   . Anxiety   . Fibromyalgia   . GERD (gastroesophageal reflux disease)   . Hypertension   . Migraine   . Seizures     Past Surgical History  Procedure Laterality  Date  . Cesarean section    . Mouth surgery   2014   Family History: History reviewed. No pertinent family history. Social History:  History  Alcohol Use No     History  Drug Use  . 1.00 per week  . Special: Cocaine, Marijuana    History   Social History  . Marital Status: Single    Spouse Name: N/A  . Number of Children: N/A  . Years of Education: N/A   Social History Main Topics  . Smoking status: Current Every Day Smoker -- 1.00 packs/day for 15 years    Types: Cigarettes  . Smokeless tobacco: Never Used  . Alcohol Use: No  . Drug Use: 1.00 per week    Special: Cocaine, Marijuana  . Sexual Activity: No   Other Topics Concern  . None   Social History Narrative   Additional Social History:    History of alcohol / drug use?: Yes Negative Consequences of Use: Personal relationships                     Musculoskeletal: Strength & Muscle Tone: decreased Gait & Station: unsteady Patient leans: N/A  Psychiatric Specialty Exam: Physical Exam  Constitutional:  Physical exam findings reviewed from the [facility] and I concur with no noted exceptions.     Review of Systems  Constitutional: Positive for malaise/fatigue.  Gastrointestinal: Positive for nausea.  Neurological: Positive for dizziness, tingling, weakness and headaches.  Psychiatric/Behavioral: Positive for depression, suicidal ideas and substance abuse. Negative for hallucinations and memory loss. The patient is nervous/anxious. The patient does not have insomnia.     Blood pressure 132/88, pulse 93, temperature 98.5 F (36.9 C), temperature source Oral, resp. rate 20, height 5' 7" (1.702 m), weight 61.689 kg (136 lb).Body mass index is 21.3 kg/(m^2).  General Appearance: Disheveled  Eye Contact::  Good  Speech:  Clear and Coherent and Slow  Volume:  Decreased  Mood:  Dysphoric  Affect:  Constricted  Thought Process:  Goal Directed and Intact  Orientation:  Full (Time, Place, and Person)  Thought Content:  Worries, concerns  Suicidal  Thoughts:  No  Homicidal Thoughts:  No  Memory:  Immediate;   Good Recent;   Good Remote;   Good  Judgement:  Impaired  Insight:  Lacking  Psychomotor Activity:  Decreased  Concentration:  Poor  Recall:  Fair  Fund of Knowledge:Fair  Language: Fair  Akathisia:  No  Handed:  Right  AIMS (if indicated):     Assets:  Communication Skills Desire for Improvement Resilience Social Support  ADL's:  Intact  Cognition: WNL  Sleep:      Risk to Self: Is patient at risk for suicide?: Yes Risk to Others:   Prior Inpatient Therapy:   Prior Outpatient Therapy:    Alcohol Screening: 1. How often do you have a drink containing alcohol?: Monthly or less 2. How many drinks containing alcohol do you have on a typical day when you are drinking?: 3 or 4 3. How often do you have six or more drinks on one occasion?: Less than monthly Preliminary Score: 2 4. How often during the last year have you found that you were not able to stop drinking once you had started?: Never 5. How often during the last year have you failed to do what was normally expected from you becasue of drinking?: Never 6. How often during the last year have you needed a first drink in the morning to get   yourself going after a heavy drinking session?: Never 7. How often during the last year have you had a feeling of guilt of remorse after drinking?: Never 8. How often during the last year have you been unable to remember what happened the night before because you had been drinking?: Never 9. Have you or someone else been injured as a result of your drinking?: No 10. Has a relative or friend or a doctor or another health worker been concerned about your drinking or suggested you cut down?: No Alcohol Use Disorder Identification Test Final Score (AUDIT): 3  Allergies:   Allergies  Allergen Reactions  . Prednisone Shortness Of Breath and Swelling  . Claritin [Loratadine]     Unknown  . Nsaids Other (See Comments)    GERD  .  Sulfa Antibiotics Swelling   Lab Results:  Results for orders placed or performed during the hospital encounter of 08/02/14 (from the past 48 hour(s))  Basic metabolic panel     Status: Abnormal   Collection Time: 08/04/14  5:39 AM  Result Value Ref Range   Sodium 145 135 - 145 mmol/L   Potassium 3.3 (L) 3.5 - 5.1 mmol/L   Chloride 106 101 - 111 mmol/L   CO2 30 22 - 32 mmol/L   Glucose, Bld 90 65 - 99 mg/dL   BUN 5 (L) 6 - 20 mg/dL   Creatinine, Ser 0.60 0.44 - 1.00 mg/dL   Calcium 8.3 (L) 8.9 - 10.3 mg/dL   GFR calc non Af Amer >60 >60 mL/min   GFR calc Af Amer >60 >60 mL/min    Comment: (NOTE) The eGFR has been calculated using the CKD EPI equation. This calculation has not been validated in all clinical situations. eGFR's persistently <60 mL/min signify possible Chronic Kidney Disease.    Anion gap 9 5 - 15  Carbamazepine level, total     Status: Abnormal   Collection Time: 08/04/14  5:39 AM  Result Value Ref Range   Carbamazepine Lvl 3.6 (L) 4.0 - 12.0 ug/mL    Comment: Performed at [facility]  Hepatic function panel     Status: Abnormal   Collection Time: 08/04/14  5:39 AM  Result Value Ref Range   Total Protein 5.7 (L) 6.5 - 8.1 g/dL   Albumin 2.9 (L) 3.5 - 5.0 g/dL   AST 24 15 - 41 U/L   ALT 24 14 - 54 U/L   Alkaline Phosphatase 64 38 - 126 U/L   Total Bilirubin 0.8 0.3 - 1.2 mg/dL   Bilirubin, Direct 0.1 0.1 - 0.5 mg/dL   Indirect Bilirubin 0.7 0.3 - 0.9 mg/dL   Current Medications: Current Facility-Administered Medications  Medication Dose Route Frequency Provider Last Rate Last Dose  . acetaminophen (TYLENOL) tablet 650 mg  650 mg Oral Q6H PRN Jenne Campus, MD   650 mg at 08/05/14 0809  . albuterol (PROVENTIL HFA;VENTOLIN HFA) 108 (90 BASE) MCG/ACT inhaler 1-2 puff  1-2 puff Inhalation Q6H PRN Jenne Campus, MD      . alum & mag hydroxide-simeth (MAALOX/MYLANTA) 200-200-20 MG/5ML suspension 30 mL  30 mL Oral Q4H PRN Myer Peer Cobos, MD      .  feeding supplement (ENSURE ENLIVE) (ENSURE ENLIVE) liquid 237 mL  237 mL Oral BID BM Nicholaus Bloom, MD   237 mL at 08/05/14 0810  . LORazepam (ATIVAN) tablet 1 mg  1 mg Oral BID PRN Jenne Campus, MD   1 mg at 08/05/14 0809  .  magnesium hydroxide (MILK OF MAGNESIA) suspension 30 mL  30 mL Oral Daily PRN Fernando A Cobos, MD      . methocarbamol (ROBAXIN) tablet 500 mg  500 mg Oral Q6H PRN  A , NP   500 mg at 08/05/14 1122  . ondansetron (ZOFRAN-ODT) disintegrating tablet 4 mg  4 mg Oral Q8H PRN  A , NP   4 mg at 08/05/14 1122  . PARoxetine (PAXIL) tablet 40 mg  40 mg Oral Daily Fernando A Cobos, MD   40 mg at 08/05/14 0807  . QUEtiapine (SEROQUEL XR) 24 hr tablet 300 mg  300 mg Oral QHS Fernando A Cobos, MD   300 mg at 08/04/14 2200  . SUMAtriptan (IMITREX) tablet 50 mg  50 mg Oral Q2H PRN  A , NP   50 mg at 08/05/14 1122  . traZODone (DESYREL) tablet 100 mg  100 mg Oral QHS PRN Fernando A Cobos, MD   100 mg at 08/04/14 2130   PTA Medications: Prescriptions prior to admission  Medication Sig Dispense Refill Last Dose  . albuterol (PROVENTIL HFA;VENTOLIN HFA) 108 (90 BASE) MCG/ACT inhaler Inhale 1-2 puffs into the lungs every 6 (six) hours as needed for wheezing or shortness of breath. 1 Inhaler 1 Past Week at Unknown time  . ALPRAZolam (XANAX) 1 MG tablet Take 1 mg by mouth 3 (three) times daily as needed for anxiety (anxiety).   07/31/2014 at Unknown time  . methocarbamol (ROBAXIN) 500 MG tablet Take 1 tablet (500 mg total) by mouth every 8 (eight) hours as needed for muscle spasms.     . PARoxetine (PAXIL) 40 MG tablet Take 1 tablet (40 mg total) by mouth daily.   Past Week at Unknown time  . SEROQUEL XR 300 MG 24 hr tablet Take 300 mg by mouth at bedtime.   0 07/31/2014 at Unknown time  . traZODone (DESYREL) 100 MG tablet Take 100 mg by mouth at bedtime.   07/31/2014 at Unknown time    Previous Psychotropic Medications: Yes  Reports being on many psychiatric  medications in the past to include per patient report Depakote, Risperdal, Prozac, Zoloft  Substance Abuse History in the last 12 months:  Yes.    Consequences of Substance Abuse: Reports not consistently taking psychiatric medications because of drug use.   Results for orders placed or performed during the hospital encounter of 08/02/14 (from the past 72 hour(s))  Carbamazepine level, total     Status: Abnormal   Collection Time: 08/02/14 12:54 PM  Result Value Ref Range   Carbamazepine Lvl 23.2 (HH) 4.0 - 12.0 ug/mL    Comment: RESULTS CONFIRMED BY MANUAL DILUTION CRITICAL RESULT CALLED TO, READ BACK BY AND VERIFIED WITH: K CAMPBELL,RN 1516 08/02/2014 WBOND Performed at [facility]   Protime-INR     Status: None   Collection Time: 08/02/14  6:00 PM  Result Value Ref Range   Prothrombin Time 13.8 11.6 - 15.2 seconds   INR 1.04 0.00 - 1.49  TSH     Status: None   Collection Time: 08/02/14  6:00 PM  Result Value Ref Range   TSH 0.437 0.350 - 4.500 uIU/mL  Basic metabolic panel     Status: Abnormal   Collection Time: 08/03/14  5:40 AM  Result Value Ref Range   Sodium 143 135 - 145 mmol/L   Potassium 3.9 3.5 - 5.1 mmol/L   Chloride 110 101 - 111 mmol/L   CO2 26 22 - 32 mmol/L   Glucose,   Bld 109 (H) 65 - 99 mg/dL   BUN 6 6 - 20 mg/dL   Creatinine, Ser 0.65 0.44 - 1.00 mg/dL   Calcium 8.3 (L) 8.9 - 10.3 mg/dL   GFR calc non Af Amer >60 >60 mL/min   GFR calc Af Amer >60 >60 mL/min    Comment: (NOTE) The eGFR has been calculated using the CKD EPI equation. This calculation has not been validated in all clinical situations. eGFR's persistently <60 mL/min signify possible Chronic Kidney Disease.    Anion gap 7 5 - 15  CBC     Status: Abnormal   Collection Time: 08/03/14  5:40 AM  Result Value Ref Range   WBC 6.8 4.0 - 10.5 K/uL   RBC 3.75 (L) 3.87 - 5.11 MIL/uL   Hemoglobin 12.7 12.0 - 15.0 g/dL   HCT 40.5 36.0 - 46.0 %   MCV 108.0 (H) 78.0 - 100.0 fL   MCH 33.9  26.0 - 34.0 pg   MCHC 31.4 30.0 - 36.0 g/dL   RDW 13.9 11.5 - 15.5 %   Platelets 249 150 - 400 K/uL  Carbamazepine level, total     Status: None   Collection Time: 08/03/14  5:40 AM  Result Value Ref Range   Carbamazepine Lvl 10.5 4.0 - 12.0 ug/mL    Comment: Performed at [facility]  Vitamin B12     Status: None   Collection Time: 08/03/14 10:30 AM  Result Value Ref Range   Vitamin B-12 511 180 - 914 pg/mL    Comment: (NOTE) This assay is not validated for testing neonatal or myeloproliferative syndrome specimens for Vitamin B12 levels. Performed at [facility]   Folate     Status: None   Collection Time: 08/03/14 10:33 AM  Result Value Ref Range   Folate 16.0 >5.9 ng/mL    Comment: Performed at [facility] metabolic panel     Status: Abnormal   Collection Time: 08/04/14  5:39 AM  Result Value Ref Range   Sodium 145 135 - 145 mmol/L   Potassium 3.3 (L) 3.5 - 5.1 mmol/L   Chloride 106 101 - 111 mmol/L   CO2 30 22 - 32 mmol/L   Glucose, Bld 90 65 - 99 mg/dL   BUN 5 (L) 6 - 20 mg/dL   Creatinine, Ser 0.60 0.44 - 1.00 mg/dL   Calcium 8.3 (L) 8.9 - 10.3 mg/dL   GFR calc non Af Amer >60 >60 mL/min   GFR calc Af Amer >60 >60 mL/min    Comment: (NOTE) The eGFR has been calculated using the CKD EPI equation. This calculation has not been validated in all clinical situations. eGFR's persistently <60 mL/min signify possible Chronic Kidney Disease.    Anion gap 9 5 - 15  Carbamazepine level, total     Status: Abnormal   Collection Time: 08/04/14  5:39 AM  Result Value Ref Range   Carbamazepine Lvl 3.6 (L) 4.0 - 12.0 ug/mL    Comment: Performed at [facility]  Hepatic function panel     Status: Abnormal   Collection Time: 08/04/14  5:39 AM  Result Value Ref Range   Total Protein 5.7 (L) 6.5 - 8.1 g/dL   Albumin 2.9 (L) 3.5 - 5.0 g/dL   AST 24 15 - 41 U/L   ALT 24 14 - 54 U/L   Alkaline Phosphatase 64 38 - 126 U/L   Total Bilirubin 0.8  0.3 - 1.2 mg/dL   Bilirubin,  Direct 0.1 0.1 - 0.5 mg/dL   Indirect Bilirubin 0.7 0.3 - 0.9 mg/dL    Observation Level/Precautions:  15 minute checks  Laboratory:  CBC Chemistry Profile UDS Tegretol levels   Psychotherapy:  Individual and Group Therapy  Medications:  Continue Paxil 40 mg daily for depression, Seroquel XR 300 mg daily for mood stability, Trazodone 100 mg hs prn insomnia, Robaxin 500 mg prn muscle pain, Zofran prn nausea, Imitrex prn headache, Will consider starting a mood stabilizer when patient has become more stable.   Consultations:  As needed  Discharge Concerns:  Safety and Stability   Estimated LOS: 5-7 days  Other:  Increase collateral information from family, 1:1 observation for 24 hours until patient gait more stable along with less physical symptoms, Will repeat BMP to evaluate if hypokalemia is still present    Psychological Evaluations: Yes   Treatment Plan Summary: Daily contact with patient to assess and evaluate symptoms and progress in treatment and Medication management  Treatment Plan/Recommendations:   1. Admit for crisis management and stabilization. Estimated length of stay 5-7 days. 2. Medication management to reduce current symptoms to base line and improve the patient's level of functioning.  3. Develop treatment plan to decrease risk of relapse upon discharge of depressive symptoms and the need for readmission. 5. Group therapy to facilitate development of healthy coping skills to use for depression and mood lability. 6. Health care follow up as needed for medical problems.  7. Discharge plan to include therapy to help patient cope with  stressors.  8. Call for Consult with Hospitalist for additional specialty patient services as needed.   Medical Decision Making:  Review of Psycho-Social Stressors (1), Review or order clinical lab tests (1), Established Problem, Worsening (2), Review of Last Therapy Session (1), Review of Medication Regimen &  Side Effects (2) and Review of New Medication or Change in Dosage (2)  I certify that inpatient services furnished can reasonably be expected to improve the patient's condition.   Elmarie Shiley NP-C 6/20/201611:47 AM

## 2014-08-05 NOTE — [facility] Group Notes (Signed)
[facility] LCSW Aftercare Discharge Planning Group Note  08/05/2014 8:45 AM  Pt did not attend, sleeping in room.  Chad Cordial, LCSWA 08/05/2014 9:33 AM

## 2014-08-06 DIAGNOSIS — F1020 Alcohol dependence, uncomplicated: Secondary | ICD-10-CM

## 2014-08-06 LAB — LIPID PANEL
CHOL/HDL RATIO: 3.2 ratio
CHOLESTEROL: 250 mg/dL — AB (ref 0–200)
HDL: 77 mg/dL (ref >40)
LDL Cholesterol: 154 mg/dL — ABNORMAL HIGH (ref 0–99)
Triglycerides: 93 mg/dL (ref <150)
VLDL: 19 mg/dL (ref 0–40)

## 2014-08-06 LAB — BASIC METABOLIC PANEL
ANION GAP: 7 (ref 5–15)
BUN: 11 mg/dL (ref 6–20)
CALCIUM: 8.7 mg/dL — AB (ref 8.9–10.3)
CO2: 33 mmol/L — ABNORMAL HIGH (ref 22–32)
Chloride: 101 mmol/L (ref 101–111)
Creatinine, Ser: 0.75 mg/dL (ref 0.44–1.00)
GFR calc Af Amer: >60 mL/min (ref >60)
GFR calc non Af Amer: >60 mL/min (ref >60)
Glucose, Bld: 97 mg/dL (ref 65–99)
Potassium: 4.3 mmol/L (ref 3.5–5.1)
Sodium: 141 mmol/L (ref 135–145)

## 2014-08-06 MED ORDER — KETOROLAC TROMETHAMINE 10 MG PO TABS
10.0000 mg | ORAL_TABLET | Freq: Four times a day (QID) | ORAL | Status: DC | PRN
  Administered 2014-08-06 – 2014-08-08 (×4): 10 mg via ORAL
  Filled 2014-08-06 (×3): qty 1

## 2014-08-06 MED ORDER — KETOROLAC TROMETHAMINE 15 MG/ML IJ SOLN
15.0000 mg | Freq: Once | INTRAMUSCULAR | Status: AC
  Administered 2014-08-06: 15 mg via INTRAMUSCULAR
  Filled 2014-08-06 (×2): qty 1

## 2014-08-06 NOTE — Progress Notes (Signed)
Recreation Therapy Notes  Animal-Assisted Activity (AAA) Program Checklist/Progress Notes Patient Eligibility Criteria Checklist & Daily Group note for Rec Tx Intervention  Date: 06.21.16 Time: 2:30 pm Location: [address]  AAA/T Program Assumption of Risk Form signed by Patient/ or Parent Legal Guardian yes  Patient is free of allergies or sever asthma yes  Patient reports no fear of animals yes  Patient reports no history of cruelty to animalsyes  Patient understands his/her participation is voluntary yes  Patient washes hands before animal contact yes  Patient washes hands after animal contact yes  Education: Hand Washing, Appropriate Animal Interaction   Clinical Observations/Feedback: Patient did not attend group.   Caroll Rancher, LRT/CTRS         Caroll Rancher A 08/06/2014 4:11 PM

## 2014-08-06 NOTE — Progress Notes (Signed)
NUTRITION ASSESSMENT  Pt identified as at risk on the Malnutrition Screen Tool  INTERVENTION: 1. Educated patient on the importance of nutrition and encouraged intake of food and beverages. 2. Discussed weight goals. 3. Supplements: continue Ensure Enlive po BID, each supplement provides 350 kcal and 20 grams of protein  NUTRITION DIAGNOSIS: Unintentional weight loss related to sub-optimal intake as evidenced by pt report.   Goal: Pt to meet >/= 90% of their estimated nutrition needs.  Monitor:  PO intake  Assessment:  Pt seen for MST. Pt admitted with SI, suicide attempt. She has lost 4 lbs in 14 days (3% body weight) which is significant for this time frame. Ensure order already in place.  49 y.o. female  Height: Ht Readings from Last 1 Encounters:  08/04/14 5\' 7"  (1.702 m)    Weight: Wt Readings from Last 1 Encounters:  08/04/14 136 lb (61.689 kg)    Weight Hx: Wt Readings from Last 10 Encounters:  08/04/14 136 lb (61.689 kg)  07/21/14 140 lb (63.504 kg)  12/31/13 134 lb (60.782 kg)  04/22/13 148 lb 4 oz (67.246 kg)  04/06/13 150 lb (68.04 kg)  01/07/12 162 lb (73.483 kg)  11/06/11 157 lb (71.215 kg)  10/23/11 154 lb (69.854 kg)  09/03/11 158 lb (71.668 kg)    BMI:  Body mass index is 21.3 kg/(m^2). Pt meets criteria for normal weight status based on current BMI.  Estimated Nutritional Needs: Kcal: 25-30 kcal/kg Protein: > 1 gram protein/kg Fluid: 1 ml/kcal  Diet Order: Diet regular Room service appropriate?: Yes; Fluid consistency:: Thin Pt is also offered choice of unit snacks mid-morning and mid-afternoon.  Pt is eating as desired.   Lab results and medications reviewed.    Trenton Gammon, RD, LDN Inpatient Clinical Dietitian Pager # [phone] After hours/weekend pager # [phone]
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
## 2014-08-06 NOTE — [facility] Group Notes (Signed)
[facility] Group Therapy 08/06/2014 1:15pm  Type of Therapy: [facility] Presentation  Pt did not attend, sleeping in room.  Chad Cordial, LCSWA 08/06/2014 1:30 PM

## 2014-08-06 NOTE — [facility] Group Notes (Signed)
Adult Psychoeducational Group Note  Date:  08/06/2014 Time:  0900am  Group Topic/Focus:  Goals Group:   The focus of this group is to help patients establish daily goals to achieve during treatment and discuss how the patient can incorporate goal setting into their daily lives to aide in recovery. Orientation:   The focus of this group is to educate the patient on the purpose and policies of crisis stabilization and provide a format to answer questions about their admission.  The group details unit policies and expectations of patients while admitted.  Participation Level:  Active  Participation Quality:  Appropriate and Attentive  Affect:  Appropriate  Cognitive:  Alert and Appropriate  Insight: Appropriate  Engagement in Group:  Engaged  Modes of Intervention:  Discussion, Education and Orientation  Alfonse Spruce 08/06/2014, 10:19 AM

## 2014-08-06 NOTE — Progress Notes (Signed)
[facility] MD Progress Note  08/06/2014 10:25 AM Kristie SAMPSEL  MRN:  [id number] Subjective:  [name] is focused on pain in her arm after she had the IV kept for days in a row. She states that she is still having residual symptoms from the Tegretol overdose. States that it was not planned she got upset and she did it. She has regrets now would not do it again. She states that she was given a shot of Toradol what helped last night. States that she would not have been able to sleep otherwise. She has been on the Paxil and the Seroquel for for a while and they do work for her, states she just had a bad moment Principal Problem: Bipolar I disorder, most recent episode depressed Diagnosis:   Patient Active Problem List   Diagnosis Date Noted  . Seizures [R56.9] 08/03/2014  . Intentional carbamazepine overdose [T42.1X2A] 08/02/2014  . Bipolar I disorder, most recent episode depressed [F31.30]   . Alcohol use disorder, severe, dependence [F10.20]   . Cocaine use disorder, severe, dependence [F14.20]   . Cannabis use disorder, severe, dependence [F12.20]   . History of cluster headache [Z86.69] 10/27/2011  . Migraine headache with aura [G43.109] 10/27/2011  . Lumbago with sciatica of left side [M54.42] 10/27/2011   Total Time spent with patient: 30 minutes   Past Medical History:  Past Medical History  Diagnosis Date  . Bipolar 1 disorder   . Schizophrenia   . Anxiety   . Fibromyalgia   . GERD (gastroesophageal reflux disease)   . Hypertension   . Migraine   . Seizures     Past Surgical History  Procedure Laterality Date  . Cesarean section    . Mouth surgery  2014   Family History: History reviewed. No pertinent family history. Social History:  History  Alcohol Use No     History  Drug Use  . 1.00 per week  . Special: Cocaine, Marijuana    History   Social History  . Marital Status: Single    Spouse Name: N/A  . Number of Children: N/A  . Years of Education: N/A   Social  History Main Topics  . Smoking status: Current Every Day Smoker -- 1.00 packs/day for 15 years    Types: Cigarettes  . Smokeless tobacco: Never Used  . Alcohol Use: No  . Drug Use: 1.00 per week    Special: Cocaine, Marijuana  . Sexual Activity: No   Other Topics Concern  . None   Social History Narrative   Additional History:    Sleep: Poor  Appetite:  Fair   Assessment:   Musculoskeletal: Strength & Muscle Tone: within normal limits Gait & Station: normal Patient leans: normal   Psychiatric Specialty Exam: Physical Exam  Review of Systems  Constitutional: Positive for weight loss and malaise/fatigue.  Eyes: Negative.   Respiratory: Positive for cough and shortness of breath.        Pack a day  Cardiovascular: Negative.   Gastrointestinal: Positive for nausea and diarrhea.  Genitourinary: Negative.   Musculoskeletal: Positive for myalgias, back pain, joint pain and neck pain.  Skin: Negative.   Neurological: Positive for weakness and headaches.  Endo/Heme/Allergies: Negative.   Psychiatric/Behavioral: Positive for depression and substance abuse. The patient is nervous/anxious.     Blood pressure 118/74, pulse 67, temperature 97.4 F (36.3 C), temperature source Oral, resp. rate 18, height 5' 7" (1.702 m), weight 61.689 kg (136 lb).Body mass index is 21.3 kg/(m^2).  General  Appearance: Fairly Groomed  Engineer, water::  Fair  Speech:  Clear and Coherent  Volume:  Normal  Mood:  Anxious and worried in pain  Affect:  Appropriate  Thought Process:  Coherent and Goal Directed  Orientation:  Full (Time, Place, and Person)  Thought Content:  symptoms events worries concerns  Suicidal Thoughts:  No  Homicidal Thoughts:  No  Memory:  Negative Immediate;   Fair Recent;   Fair Remote;   Fair  Judgement:  Fair  Insight:  Fair  Psychomotor Activity:  Restlessness  Concentration:  Fair  Recall:  [employer] of Knowledge:Fair  Language: Fair  Akathisia:  No  Handed:   Right  AIMS (if indicated):     Assets:  Desire for Improvement  ADL's:  Intact  Cognition: WNL  Sleep:        Current Medications: Current Facility-Administered Medications  Medication Dose Route Frequency Provider Last Rate Last Dose  . acetaminophen (TYLENOL) tablet 650 mg  650 mg Oral Q6H PRN Jenne Campus, MD   650 mg at 08/05/14 1713  . albuterol (PROVENTIL HFA;VENTOLIN HFA) 108 (90 BASE) MCG/ACT inhaler 1-2 puff  1-2 puff Inhalation Q6H PRN Jenne Campus, MD      . alum & mag hydroxide-simeth (MAALOX/MYLANTA) 200-200-20 MG/5ML suspension 30 mL  30 mL Oral Q4H PRN Myer Peer Cobos, MD      . feeding supplement (ENSURE ENLIVE) (ENSURE ENLIVE) liquid 237 mL  237 mL Oral BID BM Nicholaus Bloom, MD   237 mL at 08/05/14 1455  . ketorolac (TORADOL) 15 MG/ML injection 15 mg  15 mg Intramuscular Once Nicholaus Bloom, MD      . LORazepam (ATIVAN) tablet 1 mg  1 mg Oral Q4H PRN Ursula Alert, MD   1 mg at 08/06/14 7989   Or  . LORazepam (ATIVAN) injection 1 mg  1 mg Intramuscular Q4H PRN Ursula Alert, MD      . LORazepam (ATIVAN) tablet 1 mg  1 mg Oral BID PRN Jenne Campus, MD   1 mg at 08/05/14 0809  . magnesium hydroxide (MILK OF MAGNESIA) suspension 30 mL  30 mL Oral Daily PRN Jenne Campus, MD      . methocarbamol (ROBAXIN) tablet 500 mg  500 mg Oral Q6H PRN Niel Hummer, NP   500 mg at 08/06/14 0830  . nicotine (NICODERM CQ - dosed in mg/24 hours) patch 21 mg  21 mg Transdermal Daily Niel Hummer, NP   21 mg at 08/06/14 0824  . ondansetron (ZOFRAN-ODT) disintegrating tablet 4 mg  4 mg Oral Q8H PRN Niel Hummer, NP   4 mg at 08/05/14 2002  . PARoxetine (PAXIL) tablet 40 mg  40 mg Oral Daily Jenne Campus, MD   40 mg at 08/06/14 0825  . potassium chloride SA (K-DUR,KLOR-CON) CR tablet 20 mEq  20 mEq Oral BID Laverle Hobby, PA-C   20 mEq at 08/06/14 0825  . QUEtiapine (SEROQUEL XR) 24 hr tablet 300 mg  300 mg Oral QHS Jenne Campus, MD   300 mg at 08/05/14 2125  .  SUMAtriptan (IMITREX) tablet 50 mg  50 mg Oral Q2H PRN Niel Hummer, NP   50 mg at 08/06/14 2119  . traZODone (DESYREL) tablet 100 mg  100 mg Oral QHS PRN Jenne Campus, MD   100 mg at 08/04/14 2130    Lab Results:  Results for orders placed or performed during the hospital encounter  of 08/04/14 (from the past 48 hour(s))  Lipid panel     Status: Abnormal   Collection Time: 08/06/14  6:35 AM  Result Value Ref Range   Cholesterol 250 (H) 0 - 200 mg/dL   Triglycerides 93 <150 mg/dL   HDL 77 >40 mg/dL   Total CHOL/HDL Ratio 3.2 RATIO   VLDL 19 0 - 40 mg/dL   LDL Cholesterol 154 (H) 0 - 99 mg/dL    Comment:        Total Cholesterol/HDL:CHD Risk Coronary Heart Disease Risk Table                     Men   Women  1/2 Average Risk   3.4   3.3  Average Risk       5.0   4.4  2 X Average Risk   9.6   7.1  3 X Average Risk  23.4   11.0        Use the calculated Patient Ratio above and the CHD Risk Table to determine the patient's CHD Risk.        ATP III CLASSIFICATION (LDL):  <100     mg/dL   Optimal  100-129  mg/dL   Near or Above                    Optimal  130-159  mg/dL   Borderline  160-189  mg/dL   High  >190     mg/dL   Very High Performed at [facility] metabolic panel     Status: Abnormal   Collection Time: 08/06/14  6:35 AM  Result Value Ref Range   Sodium 141 135 - 145 mmol/L   Potassium 4.3 3.5 - 5.1 mmol/L   Chloride 101 101 - 111 mmol/L   CO2 33 (H) 22 - 32 mmol/L   Glucose, Bld 97 65 - 99 mg/dL   BUN 11 6 - 20 mg/dL   Creatinine, Ser 0.75 0.44 - 1.00 mg/dL   Calcium 8.7 (L) 8.9 - 10.3 mg/dL   GFR calc non Af Amer >60 >60 mL/min   GFR calc Af Amer >60 >60 mL/min    Comment: (NOTE) The eGFR has been calculated using the CKD EPI equation. This calculation has not been validated in all clinical situations. eGFR's persistently <60 mL/min signify possible Chronic Kidney Disease.    Anion gap 7 5 - 15    Comment: Performed at [facility]    Physical Findings: AIMS: Facial and Oral Movements Muscles of Facial Expression: None, normal Lips and Perioral Area: None, normal Jaw: None, normal Tongue: None, normal,Extremity Movements Upper (arms, wrists, hands, fingers): None, normal Lower (legs, knees, ankles, toes): None, normal, Trunk Movements Neck, shoulders, hips: None, normal, Overall Severity Severity of abnormal movements (highest score from questions above): None, normal Incapacitation due to abnormal movements: None, normal Patient's awareness of abnormal movements (rate only patient's report): No Awareness, Dental Status Current problems with teeth and/or dentures?: No Does patient usually wear dentures?: No  CIWA:  CIWA-Ar Total: 3 COWS:     Treatment Plan Summary: Daily contact with patient to assess and evaluate symptoms and progress in treatment and Medication management Supportive approach/coping skills Alcohol dependence; complete the detox Work a relapse prevention plan Pain: arm pain secondary to infiltration; will use the Toradol IM and then keep an oral dose PRN Warm compresses as per NP Mood instability; continue the Seroquel and the  Paxil and optimize response CBT/mindfulness Explore residential treatment options  Medical Decision Making:  Review of Psycho-Social Stressors (1), Review of Medication Regimen & Side Effects (2) and Review of New Medication or Change in Dosage (2)     , A 08/06/2014, 10:25 AM

## 2014-08-06 NOTE — Progress Notes (Signed)
D:  Per pt self inventory pt reports sleeping poor, appetite poor, energy level low, ability to pay attention poor, rates depression at a 10 out of 10, hopelessness at an 8 out of 10, anxiety at a 3 out of 10, denies SI/HI/AVH, goal today:  "Myself, relieve pain, be healthy", anxious and depressed during interaction.    A:  Emotional support provided, Encouraged pt to continue with treatment plan and attend all group activities, q15 min checks maintained for safety.  R:  Pt is going to groups, very focused on her arm that had an IV in it prior to her coming here, NP and MD are aware, cooperative with staff and other patients on the unit.

## 2014-08-07 LAB — HEMOGLOBIN A1C
HEMOGLOBIN A1C: 5.6 % (ref 4.8–5.6)
MEAN PLASMA GLUCOSE: 114 mg/dL

## 2014-08-07 LAB — PROLACTIN: Prolactin: 19.8 ng/mL (ref 4.8–23.3)

## 2014-08-07 NOTE — Plan of Care (Signed)
Problem: Diagnosis: Increased Risk For Suicide Attempt Goal: STG-Patient Will Attend All Groups On The Unit Outcome: Not Progressing Pt did not attend evening wrap up group. Reports she not able to open up and prefer 1:1 setting

## 2014-08-07 NOTE — [facility] Group Notes (Signed)
[facility] LCSW Group Therapy 08/07/2014 1:15 PM  Type of Therapy: Group Therapy- Emotion Regulation  Pt did not attend, sleeping in room.  Chad Cordial, LCSWA 08/07/2014 1:38 PM
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
## 2014-08-07 NOTE — Progress Notes (Signed)
Recreation Therapy Notes  Date: 06.22.16 Time: 9:30 am Location: [address] Dayroom  Group Topic: Stress Management  Goal Area(s) Addresses:  Patient will verbalize importance of using healthy stress management.  Patient will identify positive emotions associated with healthy stress management.   Intervention: Guided Imagery Script  Activity :  LRT will introduce and educate patient on the stress management technique of guided imagery.  A script was used to deliver the technique to patients.  Patients were asked to follow the script read aloud by the LRT to engage in practicing guided imagery.  Education:  Stress Management, Discharge Planning.   Clinical Observations/Feedback: Patient did not attend group.  Srikar Chiang, LRT/CTRS         Jeweliana Dudgeon A 08/07/2014 4:42 PM 

## 2014-08-07 NOTE — Progress Notes (Signed)
Patient ID: [name], female   DOB: 1965/06/27, 49 y.o.   MRN: [id number] D: Patient reports she has time to think and make decision about her life. Pt reports she will ask if she can discharge tomorrow. Pt plans after discharge is to secure a place to live and follow up with her psychiatrist. Pt report she does not open up in a group setting thus prefer 1:1. Pt denies SI/HI/AVH.   Cooperative with assessment. No acute distressed noted at this time.   A: Met with pt 1:1. Medications administered as prescribed. Support and encouragement provider to attend groups and engage in milieu. Pt encouraged to discuss feelings and come to staff with any question or concerns.   R: Patient remains safe. She is complaint with medications.

## 2014-08-07 NOTE — BHH Group Notes (Signed)
[facility] LCSW Aftercare Discharge Planning Group Note  08/07/2014 8:45 AM  Participation Quality: Alert, Appropriate and Oriented  Mood/Affect: Appropriate  Depression Rating: 2  Anxiety Rating: 0  Thoughts of Suicide: Pt denies SI/HI  Will you contract for safety? Yes  Current AVH: Pt denies  Plan for Discharge/Comments: Pt attended discharge planning group and actively participated in group. CSW discussed suicide prevention education with the group and encouraged them to discuss discharge planning and any relevant barriers. Pt participated appropriately in group discussion and was able to accurately articulate a discharge plan. Pt will return home and continue following up with [facility].  Transportation Means: Pt reports access to transportation  Supports: No supports mentioned at this time  Chad Cordial, LCSWA 08/07/2014 1:08 PM

## 2014-08-07 NOTE — Progress Notes (Signed)
Pt. Was resting in bed during group.

## 2014-08-07 NOTE — Progress Notes (Signed)
D:SW alerted writer that pt was concerned about her charge cards not being in her locker and that pt reported they had not came in on admission. Writer along with security checked locker and no charge cards were found. Reported back to SW. Pt has been anxious on the unit requesting ativan and pain medications for her lt arm and headaches. Pt reports that she plans to stay with friends at d/c and move on July 1st away from location that she overdosed. She says that her grandchildren give her hope.  A:Offered support and 15 minute checks. Gave prn medications as ordered .R:Pt denies si and hi. Safety maintained on the unit.

## 2014-08-08 MED ORDER — QUETIAPINE FUMARATE ER 300 MG PO TB24: 300.0000 mg | ORAL_TABLET | Freq: Every day | ORAL | Status: AC

## 2014-08-08 MED ORDER — QUETIAPINE FUMARATE ER 300 MG PO TB24: 300.0000 mg | ORAL_TABLET | Freq: Every day | ORAL | Status: DC

## 2014-08-08 MED ORDER — PAROXETINE HCL 40 MG PO TABS: 40.0000 mg | ORAL_TABLET | Freq: Every day | ORAL | Status: AC

## 2014-08-08 MED ORDER — NICOTINE 21 MG/24HR TD PT24: 21.0000 mg | MEDICATED_PATCH | Freq: Every day | TRANSDERMAL | Status: AC

## 2014-08-08 MED ORDER — PAROXETINE HCL 40 MG PO TABS: 40.0000 mg | ORAL_TABLET | Freq: Every day | ORAL | Status: DC

## 2014-08-08 MED ORDER — TRAZODONE HCL 100 MG PO TABS: 100.0000 mg | ORAL_TABLET | Freq: Every evening | ORAL | Status: AC | PRN

## 2014-08-08 NOTE — BHH Suicide Risk Assessment (Signed)
[facility] Discharge Suicide Risk Assessment   Demographic Factors:  Caucasian  Total Time spent with patient: 30 minutes  Musculoskeletal: Strength & Muscle Tone: within normal limits Gait & Station: normal Patient leans: normal  Psychiatric Specialty Exam: Physical Exam  Review of Systems  Constitutional: Negative.   HENT: Negative.   Eyes: Negative.   Respiratory: Negative.   Cardiovascular: Negative.   Gastrointestinal: Negative.   Genitourinary: Negative.   Musculoskeletal: Negative.   Skin: Negative.   Endo/Heme/Allergies: Negative.   Psychiatric/Behavioral: The patient is nervous/anxious.     Blood pressure 101/65, pulse 70, temperature 98.1 F (36.7 C), temperature source Oral, resp. rate 16, height  (1.702 m), weight 61.689 kg (136 lb).Body mass index is 21.3 kg/(m^2).  General Appearance: Fairly Groomed  Patent attorney::  Fair  Speech:  Clear and Coherent409  Volume:  Normal  Mood:  Anxious  Affect:  Appropriate  Thought Process:  Coherent and Goal Directed  Orientation:  Full (Time, Place, and Person)  Thought Content:  plans as she moves on, relapse prevention plan  Suicidal Thoughts:  No  Homicidal Thoughts:  No  Memory:  Immediate;   Fair Recent;   Fair Remote;   Fair  Judgement:  Fair  Insight:  Present  Psychomotor Activity:  Normal  Concentration:  Fair  Recall:  [employer] of Knowledge:Fair  Language: Fair  Akathisia:  No  Handed:  Right  AIMS (if indicated):     Assets:  Desire for Improvement Housing  Sleep:  Number of Hours: 6.75  Cognition: WNL  ADL's:  Intact   Have you used any form of tobacco in the last 30 days? (Cigarettes, Smokeless Tobacco, Cigars, and/or Pipes): Yes  Has this patient used any form of tobacco in the last 30 days? (Cigarettes, Smokeless Tobacco, Cigars, and/or Pipes) Yes, A prescription for an FDA-approved tobacco cessation medication was offered at discharge and the patient refused  Mental Status Per Nursing  Assessment::   On Admission:  Suicidal ideation indicated by patient, Suicidal ideation indicated by others  Current Mental Status by Physician: in full contact with reality. There are no active S/S of withdrawal. There are no active SI plans or intent. She is willing and motivated to pursue outpatient treatment with Dr. Leonia Reader. She is going to stay with a friend at a motel for the next couple of days until her permanent room is available at a boarding house   Loss Factors: NA  Historical Factors: Impulsivity  Risk Reduction Factors:   Living with another person, especially a relative and Positive social support  Continued Clinical Symptoms:  Alcohol/Substance Abuse/Dependencies  Cognitive Features That Contribute To Risk:  Closed-mindedness, Polarized thinking and Thought constriction (tunnel vision)    Suicide Risk:  Minimal: No identifiable suicidal ideation.  Patients presenting with no risk factors but with morbid ruminations; may be classified as minimal risk based on the severity of the depressive symptoms  Principal Problem: Bipolar I disorder, most recent episode depressed Discharge Diagnoses:  Patient Active Problem List   Diagnosis Date Noted  . Seizures [R56.9] 08/03/2014  . Intentional carbamazepine overdose [T42.1X2A] 08/02/2014  . Bipolar I disorder, most recent episode depressed [F31.30]   . Alcohol use disorder, severe, dependence [F10.20]   . Cocaine use disorder, severe, dependence [F14.20]   . Cannabis use disorder, severe, dependence [F12.20]   . History of cluster headache [Z86.69] 10/27/2011  . Migraine headache with aura [G43.109] 10/27/2011  . Lumbago with sciatica of left side [M54.42] 10/27/2011  Follow-up Information    Follow up with [facility] On 08/14/2014.   Why:  at 2:30pm for medication management and to schedule next therapy appointment.   Contact information:   [address]  [address]         [place], [place] [address] Phone: [phone]        Plan Of Care/Follow-up recommendations:  Activity:  as tolerated Diet:  regular Follow up as above  Is patient on multiple antipsychotic therapies at discharge:  No   Has Patient had three or more failed trials of antipsychotic monotherapy by history:  No  Recommended Plan for Multiple Antipsychotic Therapies: NA    Alveda Vanhorne A 08/08/2014, 1:00 PM

## 2014-08-08 NOTE — Progress Notes (Signed)
  [facility] Adult Case Management Discharge Plan :  Will you be returning to the same living situation after discharge:  Yes,  home with friend At discharge, do you have transportation home?: Yes,  Pt provided with bus pass Do you have the ability to pay for your medications: Yes,  Pt provided with prescriptions  Release of information consent forms completed and in the chart;  Patient's signature needed at discharge.  Patient to Follow up at: Follow-up Information    Follow up with [facility] On 08/14/2014.   Why:  at 2:30pm for medication management and to schedule next therapy appointment.   Contact information:   [address]  [address]        [place], [place] [address] Phone: [phone]        Patient denies SI/HI: Yes,  Pt denies    Safety Planning and Suicide Prevention discussed: Yes,  with Pt. Declined family contact  Have you used any form of tobacco in the last 30 days? (Cigarettes, Smokeless Tobacco, Cigars, and/or Pipes): Yes  Has patient been referred to the Quitline?: Patient refused referral  Elaina Hoops 08/08/2014, 10:48 AM

## 2014-08-08 NOTE — BHH Group Notes (Signed)
Adult Psychoeducational Group Note  Date:  08/08/2014 Time:  0900am  Group Topic/Focus:  Goals Group:   The focus of this group is to help patients establish daily goals to achieve during treatment and discuss how the patient can incorporate goal setting into their daily lives to aide in recovery. Orientation:   The focus of this group is to educate the patient on the purpose and policies of crisis stabilization and provide a format to answer questions about their admission.  The group details unit policies and expectations of patients while admitted.  Participation Level:  Active  Participation Quality:  Appropriate and Attentive  Affect:  Appropriate  Cognitive:  Alert and Appropriate  Insight: Appropriate  Engagement in Group:  Engaged  Modes of Intervention:  Discussion, Education and Orientation  Alfonse Spruce 08/08/2014, 9:27 AM

## 2014-08-08 NOTE — Progress Notes (Signed)
D: Pt is alert and oriented x3. Pt endorses mild depression of 3; scale 0-10. Pt totally regretted what she did but very positive about the future; she states, "I am going to be set up with an ACT team when I leave here-I am hoping for the best" Pt is however pleasant and cooperative. Pt denies SI/HI at this time. Pt complained of migraine.  A: Pt was offered support, encouragement and a safe environment. Pt who had been refusing to go to groups was advised at the benefits of attending. R: Pt refused to go to group. 15 minute safety checks continue.

## 2014-08-08 NOTE — Progress Notes (Signed)
D/C instructions/meds/follow-up appointments reviewed, pt verbalized understanding, pt's belongings returned to pt, samples given. 

## 2014-08-09 NOTE — Discharge Summary (Signed)
Physician Discharge Summary Note  Patient:  [name] is an 49 y.o., female MRN:  [id number] DOB:  23-Nov-1965 Patient phone:  [phone] (home)  Patient address:   [address] [place] [address],  Total Time spent with patient: 45 minutes  Date of Admission:  08/04/2014 Date of Discharge: 08/09/2014  Reason for Admission:  Overdose on Tegretol  Principal Problem: Bipolar I disorder, most recent episode depressed Discharge Diagnoses: Patient Active Problem List   Diagnosis Date Noted  . Seizures [R56.9] 08/03/2014  . Intentional carbamazepine overdose [T42.1X2A] 08/02/2014  . Bipolar I disorder, most recent episode depressed [F31.30]   . Alcohol use disorder, severe, dependence [F10.20]   . Cocaine use disorder, severe, dependence [F14.20]   . Cannabis use disorder, severe, dependence [F12.20]   . History of cluster headache [Z86.69] 10/27/2011  . Migraine headache with aura [G43.109] 10/27/2011  . Lumbago with sciatica of left side [M54.42] 10/27/2011    Musculoskeletal: Strength & Muscle Tone: within normal limits Gait & Station: normal Patient leans: N/A  Psychiatric Specialty Exam:  SEE SRA Physical Exam  Vitals reviewed. Psychiatric: Her mood appears anxious.    Review of Systems  All other systems reviewed and are negative.   Blood pressure 101/65, pulse 70, temperature 98.1 F (36.7 C), temperature source Oral, resp. rate 16, height  (1.702 m), weight 61.689 kg (136 lb).Body mass index is 21.3 kg/(m^2).  Have you used any form of tobacco in the last 30 days? (Cigarettes, Smokeless Tobacco, Cigars, and/or Pipes): Yes  Has this patient used any form of tobacco in the last 30 days? (Cigarettes, Smokeless Tobacco, Cigars, and/or Pipes) Yes, A prescription for an FDA-approved tobacco cessation medication was offered at discharge and the patient refused Rx and samples given  Past Medical History:  Past Medical History  Diagnosis Date  .  Bipolar 1 disorder   . Schizophrenia   . Anxiety   . Fibromyalgia   . GERD (gastroesophageal reflux disease)   . Hypertension   . Migraine   . Seizures     Past Surgical History  Procedure Laterality Date  . Cesarean section    . Mouth surgery  2014   Family History: History reviewed. No pertinent family history. Social History:  History  Alcohol Use No     History  Drug Use  . 1.00 per week  . Special: Cocaine, Marijuana    History   Social History  . Marital Status: Single    Spouse Name: N/A  . Number of Children: N/A  . Years of Education: N/A   Social History Main Topics  . Smoking status: Current Every Day Smoker -- 1.00 packs/day for 15 years    Types: Cigarettes  . Smokeless tobacco: Never Used  . Alcohol Use: No  . Drug Use: 1.00 per week    Special: Cocaine, Marijuana  . Sexual Activity: No   Other Topics Concern  . None   Social History Narrative   Risk to Self: Is patient at risk for suicide?: Yes Risk to Others:   Prior Inpatient Therapy:   Prior Outpatient Therapy:    Level of Care:  OP  Hospital Course:  [name] is a 49 year old female who was taken to [facility] via EMS after an intentional overdose on Tegretol. The patient was reported to be homeless and became angry after her daughter refused to allow her to stay with her. [name] was reported to have taken around twenty five tablets. She  received medical treatment following the overdose on [facility] medical unit for hydration and monitoring for QRS prolongation.  [name] was admitted for Bipolar I disorder, most recent episode depressed and crisis management.  She was treated discharged with the medications listed below under Medication List.  Medical problems were identified and treated as needed.  Home medications were restarted as appropriate.  Improvement was monitored by observation and [name] daily report of symptom reduction.  Emotional and mental  status was monitored by daily self-inventory reports completed by [name] and clinical staff.         [name] was evaluated by the treatment team for stability and plans for continued recovery upon discharge.  [name] motivation was an integral factor for scheduling further treatment.  Employment, transportation, bed availability, health status, family support, and any pending legal issues were also considered during her hospital stay.  She was offered further treatment options upon discharge including but not limited to Residential, Intensive Outpatient, and Outpatient treatment.  [name] will follow up with the services as listed below under Follow Up Information.     Upon completion of this admission the patient was both mentally and medically stable for discharge denying suicidal/homicidal ideation, auditory/visual/tactile hallucinations, delusional thoughts and paranoia.      Consults:  psychiatry  Significant Diagnostic Studies:  labs: per ED  Discharge Vitals:   Blood pressure 101/65, pulse 70, temperature 98.1 F (36.7 C), temperature source Oral, resp. rate 16, height 5\' 7"  (1.702 m), weight 61.689 kg (136 lb). Body mass index is 21.3 kg/(m^2). Lab Results:   No results found for this or any previous visit (from the past 72 hour(s)).  Physical Findings: AIMS: Facial and Oral Movements Muscles of Facial Expression: None, normal Lips and Perioral Area: None, normal Jaw: None, normal Tongue: None, normal,Extremity Movements Upper (arms, wrists, hands, fingers): None, normal Lower (legs, knees, ankles, toes): None, normal, Trunk Movements Neck, shoulders, hips: None, normal, Overall Severity Severity of abnormal movements (highest score from questions above): None, normal Incapacitation due to abnormal movements: None, normal Patient's awareness of abnormal movements (rate only patient's report): No Awareness, Dental Status Current problems with  teeth and/or dentures?: No Does patient usually wear dentures?: No  CIWA:  CIWA-Ar Total: 4 COWS:      See Psychiatric Specialty Exam and Suicide Risk Assessment completed by Attending Physician prior to discharge.  Discharge destination:  Home  Is patient on multiple antipsychotic therapies at discharge:  No   Has Patient had three or more failed trials of antipsychotic monotherapy by history:  No    Recommended Plan for Multiple Antipsychotic Therapies: NA     Medication List    STOP taking these medications        albuterol 108 (90 BASE) MCG/ACT inhaler  Commonly known as:  PROVENTIL HFA;VENTOLIN HFA     ALPRAZolam 1 MG tablet  Commonly known as:  XANAX     benzonatate 100 MG capsule  Commonly known as:  TESSALON     carbamazepine 200 MG tablet  Commonly known as:  TEGRETOL     methocarbamol 500 MG tablet  Commonly known as:  ROBAXIN      TAKE these medications      Indication   nicotine 21 mg/24hr patch  Commonly known as:  NICODERM CQ - dosed in mg/24 hours  Place 1 patch (21 mg total) onto the skin daily.   Indication:  Nicotine Addiction     PARoxetine 40 MG tablet  Commonly known as:  PAXIL  Take 1 tablet (40 mg total) by mouth daily.   Indication:  mood stabilization     QUEtiapine 300 MG 24 hr tablet  Commonly known as:  SEROQUEL XR  Take 1 tablet (300 mg total) by mouth at bedtime.   Indication:  Mood Stabilization     traZODone 100 MG tablet  Commonly known as:  DESYREL  Take 1 tablet (100 mg total) by mouth at bedtime as needed for sleep.   Indication:  Trouble Sleeping           Follow-up Information    Follow up with [facility] On 08/14/2014.   Why:  at 2:30pm for medication management and to schedule next therapy appointment.   Contact information:   [address]  [address]        [place], [place] [address] Phone: [phone]        Follow-up recommendations:  Activity:  as tol, diet as  tol  Comments:  1.  Take all your medications as prescribed.              2.  Report any adverse side effects to outpatient provider.                       3.  Patient instructed to not use alcohol or illegal drugs while on prescription medicines.            4.  In the event of worsening symptoms, instructed patient to call 911, the crisis hotline or go to nearest emergency room for evaluation of symptoms.  Total Discharge Time: 40 min  Signed: Velna Hatchet May Agustin AGNP-BC 08/09/2014, 10:33 AM  I personally assessed the patient and formulated the plan Madie Reno A. Dub Mikes, M.D.

## 2014-12-09 ENCOUNTER — Emergency Department (HOSPITAL_COMMUNITY): Admission: EM | Admit: 2014-12-09 | Discharge: 2014-12-09 | Discharge status: Absent without leave | Payer: Self-pay

## 2015-03-19 DEATH — deceased
